# Patient Record
Sex: Female | Born: 1975 | Race: White | Hispanic: Yes | Marital: Married | Smoking: Never smoker
Health system: Southern US, Community
[De-identification: ages and names within clinical notes are randomized; demographics above are authoritative.]

## PROBLEM LIST (undated history)

## (undated) DIAGNOSIS — G43909 Migraine, unspecified, not intractable, without status migrainosus: Secondary | ICD-10-CM

## (undated) DIAGNOSIS — N939 Abnormal uterine and vaginal bleeding, unspecified: Secondary | ICD-10-CM

## (undated) DIAGNOSIS — M779 Enthesopathy, unspecified: Secondary | ICD-10-CM

## (undated) DIAGNOSIS — F909 Attention-deficit hyperactivity disorder, unspecified type: Secondary | ICD-10-CM

## (undated) DIAGNOSIS — K219 Gastro-esophageal reflux disease without esophagitis: Secondary | ICD-10-CM

## (undated) DIAGNOSIS — E282 Polycystic ovarian syndrome: Secondary | ICD-10-CM

## (undated) DIAGNOSIS — J302 Other seasonal allergic rhinitis: Secondary | ICD-10-CM

## (undated) DIAGNOSIS — Z8669 Personal history of other diseases of the nervous system and sense organs: Secondary | ICD-10-CM

## (undated) DIAGNOSIS — R002 Palpitations: Secondary | ICD-10-CM

## (undated) DIAGNOSIS — M797 Fibromyalgia: Secondary | ICD-10-CM

## (undated) DIAGNOSIS — F329 Major depressive disorder, single episode, unspecified: Secondary | ICD-10-CM

## (undated) DIAGNOSIS — R0600 Dyspnea, unspecified: Secondary | ICD-10-CM

## (undated) DIAGNOSIS — T8859XA Other complications of anesthesia, initial encounter: Secondary | ICD-10-CM

## (undated) DIAGNOSIS — N83209 Unspecified ovarian cyst, unspecified side: Secondary | ICD-10-CM

## (undated) DIAGNOSIS — D259 Leiomyoma of uterus, unspecified: Secondary | ICD-10-CM

## (undated) DIAGNOSIS — F32A Depression, unspecified: Secondary | ICD-10-CM

## (undated) DIAGNOSIS — I10 Essential (primary) hypertension: Secondary | ICD-10-CM

## (undated) DIAGNOSIS — M5030 Other cervical disc degeneration, unspecified cervical region: Secondary | ICD-10-CM

## (undated) DIAGNOSIS — F419 Anxiety disorder, unspecified: Secondary | ICD-10-CM

## (undated) DIAGNOSIS — G4730 Sleep apnea, unspecified: Secondary | ICD-10-CM

## (undated) DIAGNOSIS — R7303 Prediabetes: Secondary | ICD-10-CM

## (undated) DIAGNOSIS — J45909 Unspecified asthma, uncomplicated: Secondary | ICD-10-CM

## (undated) DIAGNOSIS — T4145XA Adverse effect of unspecified anesthetic, initial encounter: Secondary | ICD-10-CM

## (undated) HISTORY — DX: Essential (primary) hypertension: I10

## (undated) HISTORY — DX: Abnormal uterine and vaginal bleeding, unspecified: N93.9

## (undated) HISTORY — DX: Leiomyoma of uterus, unspecified: D25.9

## (undated) HISTORY — DX: Unspecified ovarian cyst, unspecified side: N83.209

## (undated) HISTORY — DX: Gastro-esophageal reflux disease without esophagitis: K21.9

## (undated) HISTORY — PX: BACK SURGERY: SHX140

## (undated) HISTORY — DX: Depression, unspecified: F32.A

## (undated) HISTORY — DX: Major depressive disorder, single episode, unspecified: F32.9

## (undated) HISTORY — PX: WISDOM TOOTH EXTRACTION: SHX21

---

## 2012-09-19 DIAGNOSIS — F32A Depression, unspecified: Secondary | ICD-10-CM | POA: Insufficient documentation

## 2012-09-19 DIAGNOSIS — F419 Anxiety disorder, unspecified: Secondary | ICD-10-CM | POA: Insufficient documentation

## 2012-09-19 DIAGNOSIS — F329 Major depressive disorder, single episode, unspecified: Secondary | ICD-10-CM | POA: Insufficient documentation

## 2012-09-19 DIAGNOSIS — Z6841 Body Mass Index (BMI) 40.0 and over, adult: Secondary | ICD-10-CM

## 2016-10-06 ENCOUNTER — Other Ambulatory Visit: Payer: Self-pay | Admitting: Orthopedic Surgery

## 2016-10-06 DIAGNOSIS — M5412 Radiculopathy, cervical region: Secondary | ICD-10-CM

## 2016-10-19 ENCOUNTER — Other Ambulatory Visit: Payer: Self-pay

## 2016-10-27 ENCOUNTER — Ambulatory Visit
Admission: RE | Admit: 2016-10-27 | Discharge: 2016-10-27 | Disposition: A | Discharge status: Home / Self Care | Payer: 59 | Source: Ambulatory Visit | Attending: Orthopedic Surgery | Admitting: Orthopedic Surgery

## 2016-10-27 DIAGNOSIS — M5412 Radiculopathy, cervical region: Secondary | ICD-10-CM

## 2017-05-23 ENCOUNTER — Ambulatory Visit (HOSPITAL_BASED_OUTPATIENT_CLINIC_OR_DEPARTMENT_OTHER)
Admit: 2017-05-23 | Discharge: 2017-05-23 | Disposition: A | Discharge status: Home / Self Care | Payer: 59 | Attending: Emergency Medicine | Admitting: Emergency Medicine

## 2017-05-23 ENCOUNTER — Encounter (HOSPITAL_BASED_OUTPATIENT_CLINIC_OR_DEPARTMENT_OTHER): Payer: Self-pay | Admitting: *Deleted

## 2017-05-23 ENCOUNTER — Emergency Department (HOSPITAL_BASED_OUTPATIENT_CLINIC_OR_DEPARTMENT_OTHER)
Admission: EM | Admit: 2017-05-23 | Discharge: 2017-05-23 | Disposition: A | Discharge status: Home / Self Care | Payer: 59 | Attending: Emergency Medicine | Admitting: Emergency Medicine

## 2017-05-23 DIAGNOSIS — R1013 Epigastric pain: Secondary | ICD-10-CM | POA: Diagnosis not present

## 2017-05-23 DIAGNOSIS — R1011 Right upper quadrant pain: Secondary | ICD-10-CM

## 2017-05-23 DIAGNOSIS — K8020 Calculus of gallbladder without cholecystitis without obstruction: Secondary | ICD-10-CM | POA: Insufficient documentation

## 2017-05-23 DIAGNOSIS — Z79899 Other long term (current) drug therapy: Secondary | ICD-10-CM | POA: Insufficient documentation

## 2017-05-23 HISTORY — DX: Polycystic ovarian syndrome: E28.2

## 2017-05-23 HISTORY — DX: Anxiety disorder, unspecified: F41.9

## 2017-05-23 LAB — URINALYSIS, ROUTINE W REFLEX MICROSCOPIC
Bilirubin Urine: NEGATIVE
Glucose, UA: NEGATIVE mg/dL
Hgb urine dipstick: NEGATIVE
Ketones, ur: NEGATIVE mg/dL
Leukocytes, UA: NEGATIVE
Nitrite: NEGATIVE
Protein, ur: NEGATIVE mg/dL
Specific Gravity, Urine: 1.014 (ref 1.005–1.030)
pH: 7.5 (ref 5.0–8.0)

## 2017-05-23 LAB — COMPREHENSIVE METABOLIC PANEL
ALT: 87 U/L — ABNORMAL HIGH (ref 14–54)
AST: 123 U/L — ABNORMAL HIGH (ref 15–41)
Albumin: 3.5 g/dL (ref 3.5–5.0)
Alkaline Phosphatase: 98 U/L (ref 38–126)
Anion gap: 7 (ref 5–15)
BUN: 9 mg/dL (ref 6–20)
CO2: 28 mmol/L (ref 22–32)
Calcium: 8.4 mg/dL — ABNORMAL LOW (ref 8.9–10.3)
Chloride: 103 mmol/L (ref 101–111)
Creatinine, Ser: 0.86 mg/dL (ref 0.44–1.00)
GFR calc Af Amer: >60 mL/min (ref >60)
GFR calc non Af Amer: >60 mL/min (ref >60)
Glucose, Bld: 139 mg/dL — ABNORMAL HIGH (ref 65–99)
Potassium: 3.7 mmol/L (ref 3.5–5.1)
Sodium: 138 mmol/L (ref 135–145)
Total Bilirubin: 0.9 mg/dL (ref 0.3–1.2)
Total Protein: 6.6 g/dL (ref 6.5–8.1)

## 2017-05-23 LAB — CBC WITH DIFFERENTIAL/PLATELET
Basophils Absolute: 0 10*3/uL (ref 0.0–0.1)
Basophils Relative: 0 %
Eosinophils Absolute: 0.4 10*3/uL (ref 0.0–0.7)
Eosinophils Relative: 4 %
HCT: 39.6 % (ref 36.0–46.0)
Hemoglobin: 13.7 g/dL (ref 12.0–15.0)
Lymphocytes Relative: 18 %
Lymphs Abs: 1.6 10*3/uL (ref 0.7–4.0)
MCH: 30.4 pg (ref 26.0–34.0)
MCHC: 34.6 g/dL (ref 30.0–36.0)
MCV: 87.8 fL (ref 78.0–100.0)
Monocytes Absolute: 1 10*3/uL (ref 0.1–1.0)
Monocytes Relative: 11 %
Neutro Abs: 6.1 10*3/uL (ref 1.7–7.7)
Neutrophils Relative %: 67 %
Platelets: 217 10*3/uL (ref 150–400)
RBC: 4.51 MIL/uL (ref 3.87–5.11)
RDW: 13.1 % (ref 11.5–15.5)
WBC: 9.1 10*3/uL (ref 4.0–10.5)

## 2017-05-23 LAB — URINALYSIS, MICROSCOPIC (REFLEX): Bacteria, UA: NONE SEEN

## 2017-05-23 LAB — PREGNANCY, URINE: Preg Test, Ur: NEGATIVE

## 2017-05-23 LAB — LIPASE, BLOOD: Lipase: 33 U/L (ref 11–51)

## 2017-05-23 MED ORDER — ONDANSETRON HCL 4 MG/2ML IJ SOLN
4.0000 mg | Freq: Once | INTRAMUSCULAR | Status: AC
  Administered 2017-05-23: 4 mg via INTRAVENOUS
  Filled 2017-05-23: qty 2

## 2017-05-23 MED ORDER — FENTANYL CITRATE (PF) 100 MCG/2ML IJ SOLN
50.0000 ug | Freq: Once | INTRAMUSCULAR | Status: AC
  Administered 2017-05-23: 50 ug via INTRAVENOUS
  Filled 2017-05-23: qty 2

## 2017-05-23 MED ORDER — SODIUM CHLORIDE 0.9 % IV BOLUS (SEPSIS)
1000.0000 mL | Freq: Once | INTRAVENOUS | Status: AC
  Administered 2017-05-23: 1000 mL via INTRAVENOUS

## 2017-05-23 NOTE — ED Provider Notes (Signed)
Patient presented to the radiology department today for RUQ [name] as an extension of their workup for abdominal pain earlier in them orning. Please see previous provider's note for details of that visit to include history, physical and medical decision making.   I only relayed the results of the study to them that there was no acute cholecystitis. I discussed decreasing the amount of fat her face. Also discussed following up with [facility] for her biliary colic.  Also discussed with them reasons to follow up at the emergency department otherwise continue following up with her primary doctor as directed by previous provider.   Merrily Pew, MD 05/23/17 [phone]

## 2017-05-23 NOTE — ED Provider Notes (Signed)
[place] DEPT [facility] Provider Note   CSN: [id number] Arrival date & time: 05/23/17  0110     History   Chief Complaint Chief Complaint  Patient presents with  . Abdominal Pain    HPI Maria Bright is a 41 y.o. female.  The history is provided by the patient.  Abdominal Pain   This is a new problem. The current episode started more than 1 week ago. The problem has been rapidly worsening. The pain is associated with eating. The pain is located in the epigastric region and RUQ. The pain is severe. Associated symptoms include diarrhea, nausea and vomiting. Pertinent negatives include fever, hematochezia and dysuria. The symptoms are aggravated by eating and palpation. Nothing relieves the symptoms.  Patient reports over past several weeks she will have episodes of epigastric abd pain It seems to be related to eating She reports associated nonbloody vomiting She also reports frequent diarrhea (nonbloody) No h/o previous abdominal surgeries   Past Medical History:  Diagnosis Date  . Anxiety   . Polycystic disease, ovaries    POC     There are no active problems to display for this patient.   History reviewed. No pertinent surgical history.  OB History    No data available       Home Medications    Prior to Admission medications   Medication Sig Start Date End Date Taking? Authorizing Provider  BuPROPion HCl (WELLBUTRIN PO) Take 300 mg by mouth.   Yes [provider]  desvenlafaxine (PRISTIQ) 100 MG 24 hr tablet Take 100 mg by mouth daily.   Yes [provider]  lisdexamfetamine (VYVANSE) 20 MG capsule Take 20 mg by mouth 2 (two) times daily.   Yes [provider]    Family History No family history on file.  Social History Social History  Substance Use Topics  . Smoking status: Not on file  . Smokeless tobacco: Not on file  . Alcohol use Not on file     Allergies   Patient has no known allergies.   Review of  Systems Review of Systems  Constitutional: Negative for fever.  Cardiovascular: Negative for chest pain.  Gastrointestinal: Positive for abdominal pain, diarrhea, nausea and vomiting. Negative for hematochezia.  Genitourinary: Negative for dysuria.  All other systems reviewed and are negative.    Physical Exam Updated Vital Signs BP (!) 146/100   Pulse 85   Temp 98.1 F (36.7 C) (Oral)   Resp 20   Ht 1.676 m (5\' 6" )   Wt 129.3 kg (285 lb)   LMP 05/23/2017 (Exact Date)   SpO2 100%   BMI 46.00 kg/m   Physical Exam CONSTITUTIONAL: Well developed/well nourished HEAD: Normocephalic/atraumatic EYES: EOMI/PERRL, no icterus ENMT: Mucous membranes moist NECK: supple no meningeal signs SPINE/BACK:entire spine nontender CV: S1/S2 noted, no murmurs/rubs/gallops noted LUNGS: Lungs are clear to auscultation bilaterally, no apparent distress ABDOMEN: soft, moderate epigastric and RUQ tenderness, no rebound or guarding, bowel sounds noted throughout abdomen, obese GU:no cva tenderness NEURO: Pt is awake/alert/appropriate, moves all extremitiesx4.  No facial droop.   EXTREMITIES: pulses normal/equal, full ROM SKIN: warm, color normal PSYCH: no abnormalities of mood noted, alert and oriented to situation   ED Treatments / Results  Labs (all labs ordered are listed, but only abnormal results are displayed) Labs Reviewed  URINALYSIS, ROUTINE W REFLEX MICROSCOPIC - Abnormal; Notable for the following:       Result Value   APPearance TURBID (*)    All other components within  normal limits  URINALYSIS, MICROSCOPIC (REFLEX) - Abnormal; Notable for the following:    Squamous Epithelial / LPF 0-5 (*)    All other components within normal limits  COMPREHENSIVE METABOLIC PANEL - Abnormal; Notable for the following:    Glucose, Bld 139 (*)    Calcium 8.4 (*)    AST 123 (*)    ALT 87 (*)    All other components within normal limits  PREGNANCY, URINE  CBC WITH DIFFERENTIAL/PLATELET   LIPASE, BLOOD    EKG  EKG Interpretation None       Radiology No results found.  Procedures Procedures (including critical care time)  Medications Ordered in ED Medications  fentaNYL (SUBLIMAZE) injection 50 mcg (50 mcg Intravenous Given 05/23/17 0258)  ondansetron (ZOFRAN) injection 4 mg (4 mg Intravenous Given 05/23/17 0258)  sodium chloride 0.9 % bolus 1,000 mL (0 mLs Intravenous Stopped 05/23/17 0348)     Initial Impression / Assessment and Plan / ED Course  I have reviewed the triage vital signs and the nursing notes.  Pertinent labs  results that were available during my care of the patient were reviewed by me and considered in my medical decision making (see chart for details).     Pt improved ABD pain resolved No focal tenderness She is taking PO fluids I Suspect biliary colic However, low suspicion for acute cholecystitis Will have her return later today for outpatient ultrasound  We discussed strict ER return precautions  Final Clinical Impressions(s) / ED Diagnoses   Final diagnoses:  Epigastric pain    New Prescriptions New Prescriptions   No medications on file     Ripley Fraise, MD 05/23/17 [phone]

## 2017-05-23 NOTE — ED Triage Notes (Signed)
C/o upper mid abd pain that started yesterday around 730pm. States she ate chinese around 130pm. C/o nausea and vomiting. Denies any diarrhea. Denies any fevers but c/o of chills. Denies any urinary frequency. Currently on menstrual cycle. Took OTC nausea med.

## 2017-06-04 ENCOUNTER — Emergency Department (HOSPITAL_COMMUNITY)
Admission: EM | Admit: 2017-06-04 | Discharge: 2017-06-05 | Disposition: A | Discharge status: Home / Self Care | Payer: 59 | Attending: Emergency Medicine | Admitting: Emergency Medicine

## 2017-06-04 ENCOUNTER — Encounter (HOSPITAL_COMMUNITY): Payer: Self-pay | Admitting: Nurse Practitioner

## 2017-06-04 DIAGNOSIS — R1011 Right upper quadrant pain: Secondary | ICD-10-CM | POA: Insufficient documentation

## 2017-06-04 DIAGNOSIS — K8020 Calculus of gallbladder without cholecystitis without obstruction: Secondary | ICD-10-CM | POA: Diagnosis not present

## 2017-06-04 DIAGNOSIS — R109 Unspecified abdominal pain: Secondary | ICD-10-CM

## 2017-06-04 DIAGNOSIS — Z79899 Other long term (current) drug therapy: Secondary | ICD-10-CM | POA: Diagnosis not present

## 2017-06-04 LAB — COMPREHENSIVE METABOLIC PANEL
ALT: 46 U/L (ref 14–54)
AST: 26 U/L (ref 15–41)
Albumin: 4.2 g/dL (ref 3.5–5.0)
Alkaline Phosphatase: 106 U/L (ref 38–126)
Anion gap: 7 (ref 5–15)
BUN: 11 mg/dL (ref 6–20)
CO2: 27 mmol/L (ref 22–32)
Calcium: 9.5 mg/dL (ref 8.9–10.3)
Chloride: 107 mmol/L (ref 101–111)
Creatinine, Ser: 0.94 mg/dL (ref 0.44–1.00)
GFR calc Af Amer: >60 mL/min (ref >60)
GFR calc non Af Amer: >60 mL/min (ref >60)
Glucose, Bld: 107 mg/dL — ABNORMAL HIGH (ref 65–99)
Potassium: 3.2 mmol/L — ABNORMAL LOW (ref 3.5–5.1)
Sodium: 141 mmol/L (ref 135–145)
Total Bilirubin: 0.2 mg/dL — ABNORMAL LOW (ref 0.3–1.2)
Total Protein: 7.7 g/dL (ref 6.5–8.1)

## 2017-06-04 LAB — CBC WITH DIFFERENTIAL/PLATELET
Basophils Absolute: 0 10*3/uL (ref 0.0–0.1)
Basophils Relative: 0 %
Eosinophils Absolute: 0.4 10*3/uL (ref 0.0–0.7)
Eosinophils Relative: 4 %
HCT: 41.1 % (ref 36.0–46.0)
Hemoglobin: 14.2 g/dL (ref 12.0–15.0)
Lymphocytes Relative: 27 %
Lymphs Abs: 2.7 10*3/uL (ref 0.7–4.0)
MCH: 29.5 pg (ref 26.0–34.0)
MCHC: 34.5 g/dL (ref 30.0–36.0)
MCV: 85.4 fL (ref 78.0–100.0)
Monocytes Absolute: 0.9 10*3/uL (ref 0.1–1.0)
Monocytes Relative: 9 %
Neutro Abs: 5.9 10*3/uL (ref 1.7–7.7)
Neutrophils Relative %: 60 %
Platelets: 260 10*3/uL (ref 150–400)
RBC: 4.81 MIL/uL (ref 3.87–5.11)
RDW: 13.1 % (ref 11.5–15.5)
WBC: 9.9 10*3/uL (ref 4.0–10.5)

## 2017-06-04 LAB — LIPASE, BLOOD: Lipase: 29 U/L (ref 11–51)

## 2017-06-04 LAB — I-STAT BETA HCG BLOOD, ED (MC, WL, AP ONLY): I-stat hCG, quantitative: <5 m[IU]/mL (ref <5)

## 2017-06-04 MED ORDER — HYDROMORPHONE HCL 1 MG/ML IJ SOLN
1.0000 mg | INTRAMUSCULAR | Status: DC | PRN
  Administered 2017-06-04 – 2017-06-05 (×2): 1 mg via INTRAVENOUS
  Filled 2017-06-04 (×2): qty 1

## 2017-06-04 MED ORDER — ONDANSETRON HCL 4 MG/2ML IJ SOLN
4.0000 mg | Freq: Once | INTRAMUSCULAR | Status: AC
  Administered 2017-06-04: 4 mg via INTRAVENOUS
  Filled 2017-06-04: qty 2

## 2017-06-04 MED ORDER — SODIUM CHLORIDE 0.9 % IV SOLN
INTRAVENOUS | Status: DC
  Administered 2017-06-04: 23:00:00 via INTRAVENOUS

## 2017-06-04 NOTE — ED Triage Notes (Signed)
Pt presents with abdominal pain induced distress, states she recently has been experiencing gall bladder issues and was awaiting to see the GI specialist for follow up. She states she is more than sure the symptoms she is experiencing are typical of her last gall bladder attack.

## 2017-06-04 NOTE — ED Provider Notes (Signed)
[place] DEPT Provider Note   CSN: [id number] Arrival date & time: 06/04/17  2158   By signing my name below, I, Soijett Blue, attest that this documentation has been prepared under the direction and in the presence of Varney Biles, MD. Electronically Signed: Soijett Blue, ED Scribe. 06/04/17. 11:16 PM.  History   Chief Complaint Chief Complaint  Patient presents with  . Abdominal Pain    Gall Bladder Issues    HPI Maria Bright is a 41 y.o. female with a PMHx of anxiety, who presents to the Emergency Department complaining of gradually worsening RUQ and epigastric abdominal pain onset 4 hours ago. Pt reports associated intermittent nausea, diaphoresis due to pain, and chills. Pt has not tried any medications for the relief of her symptoms. She notes that she has been evaluated in the ED with similar abdominal pain recently and had a RUQ [name] that showed gallstones. Pt reports that she has a consultation with a GI specialist surgeon on 06/11/2017 for evaluation of her symptoms. She denies vomiting, fever, and any other symptoms.    The history is provided by the patient and a significant other. No language interpreter was used.    Past Medical History:  Diagnosis Date  . Anxiety   . Polycystic disease, ovaries    POC     There are no active problems to display for this patient.   History reviewed. No pertinent surgical history.  OB History    No data available       Home Medications    Prior to Admission medications   Medication Sig Start Date End Date Taking? Authorizing Provider  BuPROPion HCl (WELLBUTRIN PO) Take 300 mg by mouth daily.    Yes [provider]  cetirizine (ZYRTEC) 10 MG tablet Take 10 mg by mouth daily as needed for allergies.    Yes [provider]  desvenlafaxine (PRISTIQ) 100 MG 24 hr tablet Take 100 mg by mouth daily.   Yes [provider]  diphenhydrAMINE (BENADRYL) 25 MG tablet Take 50 mg by mouth every 6  (six) hours as needed for itching or allergies.   Yes [provider]  fexofenadine (ALLEGRA) 180 MG tablet Take 180 mg by mouth daily as needed for allergies or rhinitis.   Yes [provider]  ibuprofen (ADVIL,MOTRIN) 200 MG tablet Take 800 mg by mouth every 6 (six) hours as needed for moderate pain.   Yes [provider]  lisdexamfetamine (VYVANSE) 20 MG capsule Take 20 mg by mouth 2 (two) times daily.   Yes [provider]  norethindrone-ethinyl estradiol (JUNEL FE,GILDESS FE,LOESTRIN FE) 1-20 MG-MCG tablet TAKE 1 TABLET BY MOUTH EVERY DAY 04/14/16  Yes [provider]  propranolol (INDERAL) 10 MG tablet Take 10 mg by mouth daily as needed. Anxiety 06/03/17  Yes [provider]  acetaminophen (TYLENOL 8 HOUR) 650 MG CR tablet Take 1 tablet (650 mg total) by mouth every 8 (eight) hours. 06/05/17   Varney Biles, MD  HYDROcodone-acetaminophen (NORCO/VICODIN) 5-325 MG tablet Take 1 tablet by mouth every 6 (six) hours as needed for severe pain. 06/05/17   Varney Biles, MD  omeprazole (PRILOSEC) 20 MG capsule Take 1 capsule (20 mg total) by mouth daily. 06/05/17   Varney Biles, MD    Family History History reviewed. No pertinent family history.  Social History Social History  Substance Use Topics  . Smoking status: Not on file  . Smokeless tobacco: Not on file  . Alcohol use No  Allergies   Morphine and related and Nickel   Review of Systems Review of Systems  Constitutional: Positive for chills and diaphoresis.  Gastrointestinal: Positive for abdominal pain (RUQ and epigastric) and nausea. Negative for vomiting.  All other systems reviewed and are negative.    Physical Exam Updated Vital Signs BP (!) 174/96 (BP Location: Right Arm)   Pulse 83   Temp 98.1 F (36.7 C) (Oral)   Resp 18   LMP 05/23/2017 (Exact Date)   SpO2 100%   Physical Exam  Constitutional: She is oriented to person, place, and time. She appears  well-developed and well-nourished. No distress.  HENT:  Head: Normocephalic and atraumatic.  Eyes: EOM are normal.  Neck: Neck supple.  Cardiovascular: Normal rate, regular rhythm and normal heart sounds.  Exam reveals no gallop and no friction rub.   No murmur heard. Pulmonary/Chest: Effort normal and breath sounds normal. No respiratory distress. She has no wheezes. She has no rales.  Abdominal: Soft. She exhibits no distension. There is tenderness in the right upper quadrant and epigastric area. There is guarding.  Epigastric and RUQ tenderness with guarding.   Musculoskeletal: Normal range of motion.  Neurological: She is alert and oriented to person, place, and time.  Skin: Skin is warm and dry.  Psychiatric: She has a normal mood and affect. Her behavior is normal.  Nursing note and vitals reviewed.    ED Treatments / Results  DIAGNOSTIC STUDIES: Oxygen Saturation is 100% on RA, nl by my interpretation.    COORDINATION OF CARE: 11:11 PM Discussed treatment plan with pt at bedside which includes labs, UA and pt agreed to plan.   Labs (all labs ordered are listed, but only abnormal results are displayed) Labs Reviewed  COMPREHENSIVE METABOLIC PANEL - Abnormal; Notable for the following:       Result Value   Potassium 3.2 (*)    Glucose, Bld 107 (*)    Total Bilirubin 0.2 (*)    All other components within normal limits  CBC WITH DIFFERENTIAL/PLATELET  LIPASE, BLOOD  I-STAT BETA HCG BLOOD, ED ([facility], [facility], [facility] ONLY)    EKG  EKG Interpretation None       Radiology US Abdomen Limited Ruq  Result Date: 06/05/2017 CLINICAL DATA:  Persistent right upper quadrant pain. EXAM: ULTRASOUND ABDOMEN LIMITED RIGHT UPPER QUADRANT COMPARISON:  Ultrasound 2 days prior 05/23/2017 FINDINGS: Gallbladder: Physiologically distended containing sludge and multiple gallstones. Decreased wall thickening from prior exam, currently 2.8 mm, previously 3.9 mm. No pericholecystic fluid. No  sonographic Murphy sign noted by sonographer. Common bile duct: Diameter: 4-5 mm. Liver: No focal lesion identified. Within normal limits in parenchymal echogenicity. IMPRESSION: Cholelithiasis. Decreased gallbladder wall thickening from prior exam. No sonographic findings of acute cholecystitis. Electronically Signed   By: Jeb Levering M.D.   On: 06/05/2017 01:08    Procedures Procedures (including critical care time)  Medications Ordered in ED Medications  0.9 %  sodium chloride infusion ( Intravenous Stopped 06/05/17 0130)  HYDROmorphone (DILAUDID) injection 1 mg (1 mg Intravenous Given 06/05/17 0332)  ondansetron (ZOFRAN) injection 4 mg (4 mg Intravenous Given 06/04/17 2318)  promethazine (PHENERGAN) injection 25 mg (25 mg Intravenous Given 06/05/17 0045)  HYDROcodone-acetaminophen (NORCO/VICODIN) 5-325 MG per tablet 1 tablet (1 tablet Oral Given 06/05/17 0627)  ondansetron (ZOFRAN) tablet 4 mg (4 mg Oral Given 06/05/17 6237)     Initial Impression / Assessment and Plan / ED Course  I have reviewed the triage vital signs and the nursing notes.  Pertinent labs & imaging results that were available during my care of the patient were reviewed by me and considered in my medical decision making (see chart for details).  Clinical Course as of Jun 05 720  Sat Jun 05, 2017  0093 Pain has now resolved. Strict ER return precautions have been discussed, and patient is agreeing with the plan and is comfortable with the workup done and the recommendations from the ER.  Pt started on omeprazole and asked to see GI after surgery consultation - there might be a component of PUD.  [AN]    Clinical Course User Index [AN] Varney Biles, MD    DDx includes: Pancreatitis Hepatobiliary pathology including cholecystitis Gastritis/PUD  Pt with hx of gall stones comes in with cc of abd pain. Pt's pain is epigastric, severe, and she is noted to be diaphoretic. We will repeat US Abd. She has guarding over  the epigastric region. ddx is as above. I spoke with Surgery, and Dr. Ninfa Linden informed me that if the [name] is neg for acute cholecystitis, and the labs are WNL they likely wouldn't plan imminent surgery.    Final Clinical Impressions(s) / ED Diagnoses   Final diagnoses:  Continuous severe abdominal pain  Symptomatic cholelithiasis    New Prescriptions Discharge Medication List as of 06/05/2017  6:22 AM    START taking these medications   Details  acetaminophen (TYLENOL 8 HOUR) 650 MG CR tablet Take 1 tablet (650 mg total) by mouth every 8 (eight) hours., Starting Sat 06/05/2017, Print    HYDROcodone-acetaminophen (NORCO/VICODIN) 5-325 MG tablet Take 1 tablet by mouth every 6 (six) hours as needed for severe pain., Starting Sat 06/05/2017, Print    omeprazole (PRILOSEC) 20 MG capsule Take 1 capsule (20 mg total) by mouth daily., Starting Sat 06/05/2017, Print       I personally performed the services described in this documentation, which was scribed in my presence. The recorded information has been reviewed and is accurate.     Varney Biles, MD 06/05/17 [phone]

## 2017-06-05 ENCOUNTER — Emergency Department (HOSPITAL_COMMUNITY): Payer: 59

## 2017-06-05 MED ORDER — HYDROCODONE-ACETAMINOPHEN 5-325 MG PO TABS
1.0000 | ORAL_TABLET | Freq: Once | ORAL | Status: AC
  Administered 2017-06-05: 1 via ORAL
  Filled 2017-06-05: qty 1

## 2017-06-05 MED ORDER — ONDANSETRON HCL 4 MG PO TABS
4.0000 mg | ORAL_TABLET | Freq: Once | ORAL | Status: AC
  Administered 2017-06-05: 4 mg via ORAL
  Filled 2017-06-05: qty 1

## 2017-06-05 MED ORDER — OMEPRAZOLE 20 MG PO CPDR: 20.0000 mg | DELAYED_RELEASE_CAPSULE | Freq: Every day | ORAL | 0 refills | Status: DC

## 2017-06-05 MED ORDER — HYDROCODONE-ACETAMINOPHEN 5-325 MG PO TABS: 1.0000 | ORAL_TABLET | Freq: Four times a day (QID) | ORAL | 0 refills | Status: DC | PRN

## 2017-06-05 MED ORDER — ACETAMINOPHEN ER 650 MG PO TBCR: 650.0000 mg | EXTENDED_RELEASE_TABLET | Freq: Three times a day (TID) | ORAL | 0 refills | Status: DC

## 2017-06-05 MED ORDER — PROMETHAZINE HCL 25 MG/ML IJ SOLN
25.0000 mg | Freq: Once | INTRAMUSCULAR | Status: AC
  Administered 2017-06-05: 25 mg via INTRAVENOUS
  Filled 2017-06-05: qty 1

## 2017-06-05 NOTE — ED Notes (Signed)
PT DISCHARGED. INSTRUCTIONS AND PRESCRIPTIONS GIVEN. AAOX4. PT IN NO APPARENT DISTRESS WITH MILD PAIN. THE OPPORTUNITY TO ASK QUESTIONS WAS PROVIDED. 

## 2017-06-05 NOTE — Discharge Instructions (Signed)
Please take the meds prescribed. Call the Surgery team to see if they can see you sooner.  Call the GI doctors for further evaluation as well.

## 2017-06-05 NOTE — ED Notes (Signed)
US at bedside

## 2017-06-07 ENCOUNTER — Ambulatory Visit (INDEPENDENT_AMBULATORY_CARE_PROVIDER_SITE_OTHER): Payer: 59 | Admitting: Internal Medicine

## 2017-06-07 ENCOUNTER — Encounter: Payer: Self-pay | Admitting: Internal Medicine

## 2017-06-07 VITALS — BP 130/80 | HR 92 | Temp 98.0°F | Ht 66.0 in | Wt 283.5 lb

## 2017-06-07 DIAGNOSIS — Z91018 Allergy to other foods: Secondary | ICD-10-CM | POA: Diagnosis not present

## 2017-06-07 DIAGNOSIS — F901 Attention-deficit hyperactivity disorder, predominantly hyperactive type: Secondary | ICD-10-CM

## 2017-06-07 DIAGNOSIS — F32A Depression, unspecified: Secondary | ICD-10-CM

## 2017-06-07 DIAGNOSIS — K219 Gastro-esophageal reflux disease without esophagitis: Secondary | ICD-10-CM

## 2017-06-07 DIAGNOSIS — K8020 Calculus of gallbladder without cholecystitis without obstruction: Secondary | ICD-10-CM | POA: Insufficient documentation

## 2017-06-07 DIAGNOSIS — F419 Anxiety disorder, unspecified: Secondary | ICD-10-CM | POA: Insufficient documentation

## 2017-06-07 DIAGNOSIS — M5412 Radiculopathy, cervical region: Secondary | ICD-10-CM | POA: Diagnosis not present

## 2017-06-07 DIAGNOSIS — I10 Essential (primary) hypertension: Secondary | ICD-10-CM | POA: Insufficient documentation

## 2017-06-07 DIAGNOSIS — F329 Major depressive disorder, single episode, unspecified: Secondary | ICD-10-CM

## 2017-06-07 DIAGNOSIS — F909 Attention-deficit hyperactivity disorder, unspecified type: Secondary | ICD-10-CM | POA: Insufficient documentation

## 2017-06-07 DIAGNOSIS — J301 Allergic rhinitis due to pollen: Secondary | ICD-10-CM

## 2017-06-07 DIAGNOSIS — E282 Polycystic ovarian syndrome: Secondary | ICD-10-CM

## 2017-06-07 DIAGNOSIS — J302 Other seasonal allergic rhinitis: Secondary | ICD-10-CM | POA: Insufficient documentation

## 2017-06-07 NOTE — Assessment & Plan Note (Signed)
Advised her to continue Prilosec Referral placed to GI to work up food allergies

## 2017-06-07 NOTE — Assessment & Plan Note (Signed)
Continue Zyrtec and Bendaryl

## 2017-06-07 NOTE — Assessment & Plan Note (Signed)
She is not ready for a referral to a neurosurgeon in this area at this time Will monitor

## 2017-06-07 NOTE — Assessment & Plan Note (Signed)
Follow-up with general surgery.

## 2017-06-07 NOTE — Progress Notes (Signed)
HPI  Pt presents to the clinic today to establish care and for management of the conditions listed below. She is transferring care from [facility].  Anxiety and Depression: Triggered by stressful life events. She takes Wellbutrin and Pristiq as prescribed and Propanolol as needed for anxiety. She follows at [facility].  ADHD: She is currently taking Vyvanse. She forgets to take it most of the time. She follows with [facility].  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 She has numbness and tingling in both of her arms. She follows with neurosurgeon but can not remember the name.   GERD: She is not sure what is triggering this. She recently restarted Prilosec, and has made some diet modifications as well. She reports she has a lot of food intolerances and food allergies and is requesting referral to GI for further evaluation.  HTN: Her BP today is 130/80. She does not take any antihypertensives currently but was on HCTZ in the past.  PCOS: She is currently taking the pill. She no longer takes Metformin. She does follow with GYN.  Gallstones: She reports that she has a surgical consult on Friday with [facility].  Seasonal Allergies: Worse in the fall. She takes Zyrtec daily and Benadryl as needed.  Flu: 08/2016 Tetanus: < 10 years ago Pap Smear: 03/2016 Dentist: biannually  Past Medical History:  Diagnosis Date  . Anxiety   . Depression   . GERD (gastroesophageal reflux disease)   . Hypertension   . Polycystic disease, ovaries    POC     Current Outpatient Prescriptions  Medication Sig Dispense Refill  . acetaminophen (TYLENOL 8 HOUR) 650 MG CR tablet Take 1 tablet (650 mg total) by mouth every 8 (eight) hours. 30 tablet 0  . BuPROPion HCl (WELLBUTRIN PO) Take 300 mg by mouth daily.     . cetirizine (ZYRTEC) 10 MG tablet Take 10 mg by mouth daily as needed for  allergies.     Marland Kitchen desvenlafaxine (PRISTIQ) 100 MG 24 hr tablet Take 100 mg by mouth daily.    . diphenhydrAMINE (BENADRYL) 25 MG tablet Take 50 mg by mouth every 6 (six) hours as needed for itching or allergies.    . fexofenadine (ALLEGRA) 180 MG tablet Take 180 mg by mouth daily as needed for allergies or rhinitis.    Marland Kitchen HYDROcodone-acetaminophen (NORCO/VICODIN) 5-325 MG tablet Take 1 tablet by mouth every 6 (six) hours as needed for severe pain. 12 tablet 0  . ibuprofen (ADVIL,MOTRIN) 200 MG tablet Take 800 mg by mouth every 6 (six) hours as needed for moderate pain.    Marland Kitchen lisdexamfetamine (VYVANSE) 20 MG capsule Take 20 mg by mouth 2 (two) times daily.    . norethindrone-ethinyl estradiol (JUNEL FE,GILDESS FE,LOESTRIN FE) 1-20 MG-MCG tablet TAKE 1 TABLET BY MOUTH EVERY DAY    . omeprazole (PRILOSEC) 20 MG capsule Take 1 capsule (20 mg total) by mouth daily. 60 capsule 0  . propranolol (INDERAL) 10 MG tablet Take 10 mg by mouth daily as needed. Anxiety     No current facility-administered medications for this visit.     Allergies  Allergen Reactions  . Morphine And Related Other (See Comments)    unknown  . Nickel     Family History  Problem Relation Age of Onset  . Arthritis Mother   . Hyperlipidemia Mother   . Heart disease Mother   . Hypertension Mother   .  Rheum arthritis Maternal Aunt   . Hyperlipidemia Paternal Aunt   . Diabetes Paternal Aunt   . Rheum arthritis Maternal Grandmother   . Heart disease Maternal Grandmother   . Arthritis Maternal Grandfather   . Colon cancer Maternal Grandfather   . Hyperlipidemia Maternal Grandfather   . Heart disease Maternal Grandfather   . Hypertension Maternal Grandfather     Social History   Social History  . Marital status: Married    Spouse name: N/A  . Number of children: N/A  . Years of education: N/A   Occupational History  . Not on file.   Social History Main Topics  . Smoking status: Never Smoker  . Smokeless  tobacco: Never Used  . Alcohol use No  . Drug use: Unknown  . Sexual activity: Not on file   Other Topics Concern  . Not on file   Social History Narrative  . No narrative on file    ROS:  Constitutional: Pt reports weight gain. Denies fever, malaise, fatigue, headache.  HEENT: Denies eye pain, eye redness, ear pain, ringing in the ears, wax buildup, runny nose, nasal congestion, bloody nose, or sore throat. Respiratory: Denies difficulty breathing, shortness of breath, cough or sputum production.   Cardiovascular: Denies chest pain, chest tightness, palpitations or swelling in the hands or feet.  Gastrointestinal: Pt reports abdominal pain. Denies bloating, constipation, diarrhea or blood in the stool.  GU: Denies frequency, urgency, pain with urination, blood in urine, odor or discharge. Musculoskeletal: Denies decrease in range of motion, difficulty with gait, muscle pain or joint pain and swelling.  Skin: Denies redness, rashes, lesions or ulcercations.  Neurological: Pt reports numbness and tingling in hands. Denies dizziness, difficulty with memory, difficulty with speech or problems with balance and coordination.  Psych: Pt reports anxiety and depression. Denies SI/HI.  No other specific complaints in a complete review of systems (except as listed in HPI above).  PE:  BP 130/80   Pulse 92   Temp 98 F (36.7 C) (Oral)   Ht 5\' 6"  (1.676 m)   Wt 283 lb 8 oz (128.6 kg)   LMP 05/23/2017 (Exact Date)   SpO2 98%   BMI 45.76 kg/m  Wt Readings from Last 3 Encounters:  06/07/17 283 lb 8 oz (128.6 kg)  06/05/17 290 lb (131.5 kg)  05/23/17 285 lb (129.3 kg)    General: Appears her stated age, obese in NAD. HEENT: Ears: Tm's gray and intact, normal light reflex;Throat/Mouth: Teeth present, mucosa pink and moist, no lesions or ulcerations noted.  Neck: Neck supple, trachea midline. No masses, lumps or thyromegaly present.  Cardiovascular: Normal rate and rhythm. S1,S2 noted.   No murmur, rubs or gallops noted. Pulmonary/Chest: Normal effort and positive vesicular breath sounds. No respiratory distress. No wheezes, rales or ronchi noted.  Abdomen: Soft and tender in the RUQ. Normal bowel sounds. No distention or masses noted. Musculoskeletal:  No difficulty with gait.  Neurological: Alert and oriented. Marland Kitchen  Psychiatric: Mood and affect normal. Behavior is normal. Judgment and thought content normal.    BMET    Component Value Date/Time   NA 141 06/04/2017 2253   K 3.2 (L) 06/04/2017 2253   CL 107 06/04/2017 2253   CO2 27 06/04/2017 2253   GLUCOSE 107 (H) 06/04/2017 2253   BUN 11 06/04/2017 2253   CREATININE 0.94 06/04/2017 2253   CALCIUM 9.5 06/04/2017 2253   GFRNONAA >60 06/04/2017 2253   GFRAA >60 06/04/2017 2253    Lipid Panel  No results found for: CHOL, TRIG, HDL, CHOLHDL, VLDL, LDLCALC  CBC    Component Value Date/Time   WBC 9.9 06/04/2017 2253   RBC 4.81 06/04/2017 2253   HGB 14.2 06/04/2017 2253   HCT 41.1 06/04/2017 2253   PLT 260 06/04/2017 2253   MCV 85.4 06/04/2017 2253   MCH 29.5 06/04/2017 2253   MCHC 34.5 06/04/2017 2253   RDW 13.1 06/04/2017 2253   LYMPHSABS 2.7 06/04/2017 2253   MONOABS 0.9 06/04/2017 2253   EOSABS 0.4 06/04/2017 2253   BASOSABS 0.0 06/04/2017 2253    Hgb A1C No results found for: HGBA1C   Assessment and Plan:

## 2017-06-07 NOTE — Assessment & Plan Note (Signed)
Will monitor BP for now No meds at this  time

## 2017-06-07 NOTE — Patient Instructions (Signed)
Food Choices for Gastroesophageal Reflux Disease, Adult When you have gastroesophageal reflux disease (GERD), the foods you eat and your eating habits are very important. Choosing the right foods can help ease your discomfort. What guidelines do I need to follow?  Choose fruits, vegetables, whole grains, and low-fat dairy products.  Choose low-fat meat, fish, and poultry.  Limit fats such as oils, salad dressings, butter, nuts, and avocado.  Keep a food diary. This helps you identify foods that cause symptoms.  Avoid foods that cause symptoms. These may be different for everyone.  Eat small meals often instead of 3 large meals a day.  Eat your meals slowly, in a place where you are relaxed.  Limit fried foods.  Cook foods using methods other than frying.  Avoid drinking alcohol.  Avoid drinking large amounts of liquids with your meals.  Avoid bending over or lying down until 2-3 hours after eating. What foods are not recommended? These are some foods and drinks that may make your symptoms worse: Vegetables  Tomatoes. Tomato juice. Tomato and spaghetti sauce. Chili peppers. Onion and garlic. Horseradish. Fruits  Oranges, grapefruit, and lemon (fruit and juice). Meats  High-fat meats, fish, and poultry. This includes hot dogs, ribs, ham, sausage, salami, and bacon. Dairy  Whole milk and chocolate milk. Sour cream. Cream. Butter. Ice cream. Cream cheese. Drinks  Coffee and tea. Bubbly (carbonated) drinks or energy drinks. Condiments  Hot sauce. Barbecue sauce. Sweets/Desserts  Chocolate and cocoa. Donuts. Peppermint and spearmint. Fats and Oils  High-fat foods. This includes French fries and potato chips. Other  Vinegar. Strong spices. This includes black pepper, white pepper, red pepper, cayenne, curry powder, cloves, ginger, and chili powder. The items listed above may not be a complete list of foods and drinks to avoid. Contact your dietitian for more information.    This information is not intended to replace advice given to you by your health care provider. Make sure you discuss any questions you have with your health care provider. Document Released: 05/17/2012 Document Revised: 04/23/2016 Document Reviewed: 09/20/2013 Elsevier Interactive Patient Education  2017 Elsevier Inc.  

## 2017-06-07 NOTE — Assessment & Plan Note (Signed)
She will continue Vyvanse She will continue to follow with [facility]

## 2017-06-07 NOTE — Assessment & Plan Note (Signed)
Continue Wellbutrin, Pristiq and Propanolol She will continue to follow with [facility]

## 2017-06-07 NOTE — Assessment & Plan Note (Signed)
Continue OCP She will continue to follow up with GYN

## 2017-06-11 ENCOUNTER — Ambulatory Visit: Payer: Self-pay | Admitting: General Surgery

## 2017-06-18 NOTE — Patient Instructions (Addendum)
Maria Bright  06/18/2017   Your procedure is scheduled on: 06/30/2017    Report to [facility] Main  Entrance Take [place]  elevators to 3rd floor to  [place] at    [place] AM.   Call this number if you have problems the morning of surgery [phone]    Remember: ONLY 1 PERSON MAY GO WITH YOU TO SHORT STAY TO GET  READY MORNING OF YOUR SURGERY.  Do not eat food or drink liquids :After Midnight.     Take these medicines the morning of surgery with A SIP OF WATER: Wellbutrin, ranitidine, propanolol as needed                                You may not have any metal on your body including hair pins and              piercings  Do not wear jewelry, make-up, lotions, powders or perfumes, deodorant             Do not wear nail polish.  Do not shave  48 hours prior to surgery.                 Do not bring valuables to the hospital. [place].  Contacts, dentures or bridgework may not be worn into surgery.      Patients discharged the day of surgery will not be allowed to drive home.  Name and phone number of your driver:                Please read over the following fact sheets you were given: _____________________________________________________________________             [facility] - Preparing for Surgery Before surgery, you can play an important role.  Because skin is not sterile, your skin needs to be as free of germs as possible.  You can reduce the number of germs on your skin by washing with CHG (chlorahexidine gluconate) soap before surgery.  CHG is an antiseptic cleaner which kills germs and bonds with the skin to continue killing germs even after washing. Please DO NOT use if you have an allergy to CHG or antibacterial soaps.  If your skin becomes reddened/irritated stop using the CHG and inform your nurse when you arrive at Short Stay. Do not shave (including legs and underarms) for at least  48 hours prior to the first CHG shower.  You may shave your face/neck. Please follow these instructions carefully:  1.  Shower with CHG Soap the night before surgery and the  morning of Surgery.  2.  If you choose to wash your hair, wash your hair first as usual with your  normal  shampoo.  3.  After you shampoo, rinse your hair and body thoroughly to remove the  shampoo.                           4.  Use CHG as you would any other liquid soap.  You can apply chg directly  to the skin and wash  Gently with a scrungie or clean washcloth.  5.  Apply the CHG Soap to your body ONLY FROM THE NECK DOWN.   Do not use on face/ open                           Wound or open sores. Avoid contact with eyes, ears mouth and genitals (private parts).                       Wash face,  Genitals (private parts) with your normal soap.             6.  Wash thoroughly, paying special attention to the area where your surgery  will be performed.  7.  Thoroughly rinse your body with warm water from the neck down.  8.  DO NOT shower/wash with your normal soap after using and rinsing off  the CHG Soap.                9.  Pat yourself dry with a clean towel.            10.  Wear clean pajamas.            11.  Place clean sheets on your bed the night of your first shower and do not  sleep with pets. Day of Surgery : Do not apply any lotions/deodorants the morning of surgery.  Please wear clean clothes to the hospital/surgery center.  FAILURE TO FOLLOW THESE INSTRUCTIONS MAY RESULT IN THE CANCELLATION OF YOUR SURGERY PATIENT SIGNATURE_________________________________  NURSE SIGNATURE__________________________________  ________________________________________________________________________

## 2017-06-21 ENCOUNTER — Other Ambulatory Visit: Payer: Self-pay

## 2017-06-21 ENCOUNTER — Encounter (HOSPITAL_COMMUNITY)
Admission: RE | Admit: 2017-06-21 | Discharge: 2017-06-21 | Disposition: A | Discharge status: Home / Self Care | Payer: 59 | Source: Ambulatory Visit | Attending: General Surgery | Admitting: General Surgery

## 2017-06-21 ENCOUNTER — Encounter (HOSPITAL_COMMUNITY): Payer: Self-pay

## 2017-06-21 DIAGNOSIS — Z01818 Encounter for other preprocedural examination: Secondary | ICD-10-CM | POA: Diagnosis present

## 2017-06-21 DIAGNOSIS — K8050 Calculus of bile duct without cholangitis or cholecystitis without obstruction: Secondary | ICD-10-CM | POA: Insufficient documentation

## 2017-06-21 HISTORY — DX: Other complications of anesthesia, initial encounter: T88.59XA

## 2017-06-21 HISTORY — DX: Attention-deficit hyperactivity disorder, unspecified type: F90.9

## 2017-06-21 HISTORY — DX: Other seasonal allergic rhinitis: J30.2

## 2017-06-21 HISTORY — DX: Sleep apnea, unspecified: G47.30

## 2017-06-21 HISTORY — DX: Adverse effect of unspecified anesthetic, initial encounter: T41.45XA

## 2017-06-21 LAB — COMPREHENSIVE METABOLIC PANEL
ALT: 53 U/L (ref 14–54)
AST: 35 U/L (ref 15–41)
Albumin: 3.9 g/dL (ref 3.5–5.0)
Alkaline Phosphatase: 93 U/L (ref 38–126)
Anion gap: 5 (ref 5–15)
BUN: 13 mg/dL (ref 6–20)
CO2: 28 mmol/L (ref 22–32)
Calcium: 9.1 mg/dL (ref 8.9–10.3)
Chloride: 108 mmol/L (ref 101–111)
Creatinine, Ser: 0.86 mg/dL (ref 0.44–1.00)
GFR calc Af Amer: >60 mL/min (ref >60)
GFR calc non Af Amer: >60 mL/min (ref >60)
Glucose, Bld: 99 mg/dL (ref 65–99)
Potassium: 4 mmol/L (ref 3.5–5.1)
Sodium: 141 mmol/L (ref 135–145)
Total Bilirubin: 0.5 mg/dL (ref 0.3–1.2)
Total Protein: 7.4 g/dL (ref 6.5–8.1)

## 2017-06-21 LAB — HCG, SERUM, QUALITATIVE: Preg, Serum: NEGATIVE

## 2017-06-21 NOTE — Progress Notes (Signed)
CBC diff 06-04-17 epic CMP 06-04-17 (will repeat at pre-op d/t low potassium result)

## 2017-06-25 ENCOUNTER — Inpatient Hospital Stay (HOSPITAL_COMMUNITY): Admission: RE | Admit: 2017-06-25 | Payer: 59 | Source: Ambulatory Visit

## 2017-06-30 ENCOUNTER — Encounter (HOSPITAL_COMMUNITY): Payer: Self-pay | Admitting: *Deleted

## 2017-06-30 ENCOUNTER — Ambulatory Visit (HOSPITAL_COMMUNITY)
Admission: RE | Admit: 2017-06-30 | Discharge: 2017-06-30 | Disposition: A | Discharge status: Home / Self Care | Payer: 59 | Source: Ambulatory Visit | Attending: General Surgery | Admitting: General Surgery

## 2017-06-30 ENCOUNTER — Ambulatory Visit (HOSPITAL_COMMUNITY): Payer: 59 | Admitting: Anesthesiology

## 2017-06-30 ENCOUNTER — Encounter (HOSPITAL_COMMUNITY)
Admission: RE | Disposition: A | Discharge status: Home / Self Care | Payer: Self-pay | Source: Ambulatory Visit | Attending: General Surgery

## 2017-06-30 DIAGNOSIS — I10 Essential (primary) hypertension: Secondary | ICD-10-CM | POA: Diagnosis not present

## 2017-06-30 DIAGNOSIS — F419 Anxiety disorder, unspecified: Secondary | ICD-10-CM | POA: Insufficient documentation

## 2017-06-30 DIAGNOSIS — Z888 Allergy status to other drugs, medicaments and biological substances status: Secondary | ICD-10-CM | POA: Diagnosis not present

## 2017-06-30 DIAGNOSIS — Z6841 Body Mass Index (BMI) 40.0 and over, adult: Secondary | ICD-10-CM | POA: Diagnosis not present

## 2017-06-30 DIAGNOSIS — K8064 Calculus of gallbladder and bile duct with chronic cholecystitis without obstruction: Secondary | ICD-10-CM | POA: Insufficient documentation

## 2017-06-30 DIAGNOSIS — E282 Polycystic ovarian syndrome: Secondary | ICD-10-CM | POA: Diagnosis not present

## 2017-06-30 DIAGNOSIS — K219 Gastro-esophageal reflux disease without esophagitis: Secondary | ICD-10-CM | POA: Insufficient documentation

## 2017-06-30 DIAGNOSIS — G4730 Sleep apnea, unspecified: Secondary | ICD-10-CM | POA: Diagnosis not present

## 2017-06-30 DIAGNOSIS — Z9989 Dependence on other enabling machines and devices: Secondary | ICD-10-CM | POA: Insufficient documentation

## 2017-06-30 DIAGNOSIS — F329 Major depressive disorder, single episode, unspecified: Secondary | ICD-10-CM | POA: Insufficient documentation

## 2017-06-30 DIAGNOSIS — Z885 Allergy status to narcotic agent status: Secondary | ICD-10-CM | POA: Diagnosis not present

## 2017-06-30 DIAGNOSIS — F909 Attention-deficit hyperactivity disorder, unspecified type: Secondary | ICD-10-CM | POA: Diagnosis not present

## 2017-06-30 DIAGNOSIS — Z79899 Other long term (current) drug therapy: Secondary | ICD-10-CM | POA: Insufficient documentation

## 2017-06-30 DIAGNOSIS — K8050 Calculus of bile duct without cholangitis or cholecystitis without obstruction: Secondary | ICD-10-CM | POA: Diagnosis present

## 2017-06-30 HISTORY — PX: CHOLECYSTECTOMY: SHX55

## 2017-06-30 SURGERY — LAPAROSCOPIC CHOLECYSTECTOMY
Anesthesia: General | Site: Abdomen

## 2017-06-30 MED ORDER — ROCURONIUM BROMIDE 50 MG/5ML IV SOSY
PREFILLED_SYRINGE | INTRAVENOUS | Status: AC
  Filled 2017-06-30: qty 5

## 2017-06-30 MED ORDER — HEPARIN SODIUM (PORCINE) 5000 UNIT/ML IJ SOLN
5000.0000 [IU] | Freq: Once | INTRAMUSCULAR | Status: AC
  Administered 2017-06-30: 5000 [IU] via SUBCUTANEOUS
  Filled 2017-06-30: qty 1

## 2017-06-30 MED ORDER — ACETAMINOPHEN 500 MG PO TABS
1000.0000 mg | ORAL_TABLET | ORAL | Status: AC
  Administered 2017-06-30: 1000 mg via ORAL
  Filled 2017-06-30: qty 2

## 2017-06-30 MED ORDER — MIDAZOLAM HCL 5 MG/5ML IJ SOLN
INTRAMUSCULAR | Status: DC | PRN
  Administered 2017-06-30: 2 mg via INTRAVENOUS

## 2017-06-30 MED ORDER — CEFOTETAN DISODIUM-DEXTROSE 2-2.08 GM-% IV SOLR
2.0000 g | INTRAVENOUS | Status: AC
  Administered 2017-06-30: 2 g via INTRAVENOUS

## 2017-06-30 MED ORDER — ONDANSETRON HCL 4 MG/2ML IJ SOLN
INTRAMUSCULAR | Status: AC
  Filled 2017-06-30: qty 2

## 2017-06-30 MED ORDER — CHLORHEXIDINE GLUCONATE CLOTH 2 % EX PADS: 6.0000 | MEDICATED_PAD | Freq: Once | CUTANEOUS | Status: DC

## 2017-06-30 MED ORDER — FENTANYL CITRATE (PF) 100 MCG/2ML IJ SOLN
25.0000 ug | INTRAMUSCULAR | Status: DC | PRN
  Administered 2017-06-30 (×3): 50 ug via INTRAVENOUS

## 2017-06-30 MED ORDER — FENTANYL CITRATE (PF) 100 MCG/2ML IJ SOLN
INTRAMUSCULAR | Status: DC | PRN
  Administered 2017-06-30 (×4): 50 ug via INTRAVENOUS

## 2017-06-30 MED ORDER — FENTANYL CITRATE (PF) 100 MCG/2ML IJ SOLN
INTRAMUSCULAR | Status: AC
  Filled 2017-06-30: qty 2

## 2017-06-30 MED ORDER — GLYCOPYRROLATE 0.2 MG/ML IV SOSY
PREFILLED_SYRINGE | INTRAVENOUS | Status: AC
  Filled 2017-06-30: qty 5

## 2017-06-30 MED ORDER — 0.9 % SODIUM CHLORIDE (POUR BTL) OPTIME
TOPICAL | Status: DC | PRN
  Administered 2017-06-30: 1000 mL

## 2017-06-30 MED ORDER — BUPIVACAINE-EPINEPHRINE 0.25% -1:200000 IJ SOLN
INTRAMUSCULAR | Status: DC | PRN
  Administered 2017-06-30: 50 mL

## 2017-06-30 MED ORDER — HYDROCODONE-ACETAMINOPHEN 5-325 MG PO TABS
1.0000 | ORAL_TABLET | Freq: Once | ORAL | Status: AC
  Administered 2017-06-30: 1 via ORAL
  Filled 2017-06-30: qty 1

## 2017-06-30 MED ORDER — MIDAZOLAM HCL 2 MG/2ML IJ SOLN
INTRAMUSCULAR | Status: AC
  Filled 2017-06-30: qty 2

## 2017-06-30 MED ORDER — GABAPENTIN 300 MG PO CAPS
300.0000 mg | ORAL_CAPSULE | ORAL | Status: AC
  Administered 2017-06-30: 300 mg via ORAL
  Filled 2017-06-30: qty 1

## 2017-06-30 MED ORDER — GLYCOPYRROLATE 0.2 MG/ML IJ SOLN
INTRAMUSCULAR | Status: DC | PRN
  Administered 2017-06-30: 0.6 mg via INTRAVENOUS

## 2017-06-30 MED ORDER — NEOSTIGMINE METHYLSULFATE 5 MG/5ML IV SOSY
PREFILLED_SYRINGE | INTRAVENOUS | Status: AC
  Filled 2017-06-30: qty 5

## 2017-06-30 MED ORDER — DEXAMETHASONE SODIUM PHOSPHATE 10 MG/ML IJ SOLN
INTRAMUSCULAR | Status: DC | PRN
  Administered 2017-06-30: 10 mg via INTRAVENOUS

## 2017-06-30 MED ORDER — LIDOCAINE 2% (20 MG/ML) 5 ML SYRINGE
INTRAMUSCULAR | Status: AC
  Filled 2017-06-30: qty 5

## 2017-06-30 MED ORDER — LIDOCAINE HCL (PF) 2 % IJ SOLN
INTRAMUSCULAR | Status: DC | PRN
  Administered 2017-06-30: 5 mL via INTRADERMAL

## 2017-06-30 MED ORDER — PROMETHAZINE HCL 25 MG/ML IJ SOLN: 6.2500 mg | INTRAMUSCULAR | Status: DC | PRN

## 2017-06-30 MED ORDER — HYDROCODONE-ACETAMINOPHEN 5-325 MG PO TABS: 1.0000 | ORAL_TABLET | Freq: Four times a day (QID) | ORAL | 0 refills | Status: DC | PRN

## 2017-06-30 MED ORDER — IBUPROFEN 800 MG PO TABS: 800.0000 mg | ORAL_TABLET | Freq: Three times a day (TID) | ORAL | 0 refills | Status: DC | PRN

## 2017-06-30 MED ORDER — ONDANSETRON HCL 4 MG/2ML IJ SOLN
INTRAMUSCULAR | Status: DC | PRN
  Administered 2017-06-30: 4 mg via INTRAVENOUS

## 2017-06-30 MED ORDER — SCOPOLAMINE 1 MG/3DAYS TD PT72
MEDICATED_PATCH | TRANSDERMAL | Status: AC
  Filled 2017-06-30: qty 1

## 2017-06-30 MED ORDER — ROCURONIUM BROMIDE 10 MG/ML (PF) SYRINGE
PREFILLED_SYRINGE | INTRAVENOUS | Status: DC | PRN
  Administered 2017-06-30: 10 mg via INTRAVENOUS
  Administered 2017-06-30: 30 mg via INTRAVENOUS

## 2017-06-30 MED ORDER — BUPIVACAINE LIPOSOME 1.3 % IJ SUSP
20.0000 mL | Freq: Once | INTRAMUSCULAR | Status: DC
  Filled 2017-06-30: qty 20

## 2017-06-30 MED ORDER — LACTATED RINGERS IR SOLN
Status: DC | PRN
  Administered 2017-06-30: 1000 mL

## 2017-06-30 MED ORDER — PROPOFOL 10 MG/ML IV BOLUS
INTRAVENOUS | Status: AC
  Filled 2017-06-30: qty 20

## 2017-06-30 MED ORDER — CEFOTETAN DISODIUM-DEXTROSE 2-2.08 GM-% IV SOLR
INTRAVENOUS | Status: AC
  Filled 2017-06-30: qty 50

## 2017-06-30 MED ORDER — PROPOFOL 10 MG/ML IV BOLUS
INTRAVENOUS | Status: DC | PRN
  Administered 2017-06-30: 200 mg via INTRAVENOUS

## 2017-06-30 MED ORDER — CELECOXIB 200 MG PO CAPS
400.0000 mg | ORAL_CAPSULE | ORAL | Status: AC
  Administered 2017-06-30: 400 mg via ORAL
  Filled 2017-06-30: qty 2

## 2017-06-30 MED ORDER — SUCCINYLCHOLINE CHLORIDE 200 MG/10ML IV SOSY
PREFILLED_SYRINGE | INTRAVENOUS | Status: DC | PRN
  Administered 2017-06-30: 140 mg via INTRAVENOUS

## 2017-06-30 MED ORDER — NEOSTIGMINE METHYLSULFATE 10 MG/10ML IV SOLN
INTRAVENOUS | Status: DC | PRN
  Administered 2017-06-30: 5 mg via INTRAVENOUS

## 2017-06-30 MED ORDER — BUPIVACAINE-EPINEPHRINE 0.25% -1:200000 IJ SOLN
INTRAMUSCULAR | Status: AC
  Filled 2017-06-30: qty 1

## 2017-06-30 MED ORDER — LIDOCAINE 2% (20 MG/ML) 5 ML SYRINGE
INTRAMUSCULAR | Status: DC | PRN
  Administered 2017-06-30: 100 mg via INTRAVENOUS

## 2017-06-30 MED ORDER — FENTANYL CITRATE (PF) 100 MCG/2ML IJ SOLN
INTRAMUSCULAR | Status: AC
  Administered 2017-06-30: 50 ug via INTRAVENOUS
  Filled 2017-06-30: qty 4

## 2017-06-30 MED ORDER — LACTATED RINGERS IV SOLN
INTRAVENOUS | Status: DC | PRN
  Administered 2017-06-30 (×2): via INTRAVENOUS

## 2017-06-30 MED ORDER — DEXAMETHASONE SODIUM PHOSPHATE 10 MG/ML IJ SOLN
INTRAMUSCULAR | Status: AC
  Filled 2017-06-30: qty 1

## 2017-06-30 MED ORDER — IOPAMIDOL (ISOVUE-300) INJECTION 61%
INTRAVENOUS | Status: AC
  Filled 2017-06-30: qty 50

## 2017-06-30 SURGICAL SUPPLY — 45 items
APPLIER CLIP ROT 10 11.4 M/L (STAPLE)
BANDAGE ADH SHEER 1  50/CT (GAUZE/BANDAGES/DRESSINGS) ×15 IMPLANT
BENZOIN TINCTURE PRP APPL 2/3 (GAUZE/BANDAGES/DRESSINGS) ×3 IMPLANT
CABLE HIGH FREQUENCY MONO STRZ (ELECTRODE) ×3 IMPLANT
CATH CHOLANG 76X19 KUMAR (CATHETERS) IMPLANT
CHLORAPREP W/TINT 26ML (MISCELLANEOUS) ×3 IMPLANT
CLIP APPLIE ROT 10 11.4 M/L (STAPLE) IMPLANT
CLIP LIGATING HEM O LOK PURPLE (MISCELLANEOUS) ×3 IMPLANT
CLIP LIGATING HEMO LOK XL GOLD (MISCELLANEOUS) IMPLANT
CLOSURE WOUND 1/2 X4 (GAUZE/BANDAGES/DRESSINGS) ×1
COVER MAYO STAND STRL (DRAPES) IMPLANT
COVER SURGICAL LIGHT HANDLE (MISCELLANEOUS) ×3 IMPLANT
DECANTER SPIKE VIAL GLASS SM (MISCELLANEOUS) ×3 IMPLANT
DRAIN CHANNEL 19F RND (DRAIN) IMPLANT
DRAPE C-ARM 42X120 X-RAY (DRAPES) IMPLANT
EVACUATOR SILICONE 100CC (DRAIN) IMPLANT
GAUZE SPONGE 2X2 8PLY STRL LF (GAUZE/BANDAGES/DRESSINGS) ×1 IMPLANT
GLOVE BIOGEL PI IND STRL 7.0 (GLOVE) ×1 IMPLANT
GLOVE BIOGEL PI INDICATOR 7.0 (GLOVE) ×2
GLOVE SURG SS PI 7.0 STRL IVOR (GLOVE) ×3 IMPLANT
GOWN STRL REUS W/TWL LRG LVL3 (GOWN DISPOSABLE) ×3 IMPLANT
GOWN STRL REUS W/TWL XL LVL3 (GOWN DISPOSABLE) ×6 IMPLANT
GRASPER SUT TROCAR 14GX15 (MISCELLANEOUS) ×3 IMPLANT
IRRIG SUCT STRYKERFLOW 2 WTIP (MISCELLANEOUS)
IRRIGATION SUCT STRKRFLW 2 WTP (MISCELLANEOUS) IMPLANT
KIT BASIN OR (CUSTOM PROCEDURE TRAY) ×3 IMPLANT
POUCH RETRIEVAL ECOSAC 10 (ENDOMECHANICALS) ×1 IMPLANT
POUCH RETRIEVAL ECOSAC 10MM (ENDOMECHANICALS) ×2
SCISSORS LAP 5X35 DISP (ENDOMECHANICALS) ×3 IMPLANT
SET IRRIG TUBING LAPAROSCOPIC (IRRIGATION / IRRIGATOR) ×3 IMPLANT
SHEARS HARMONIC ACE PLUS 36CM (ENDOMECHANICALS) IMPLANT
SLEEVE XCEL OPT CAN 5 100 (ENDOMECHANICALS) ×6 IMPLANT
SPONGE GAUZE 2X2 STER 10/PKG (GAUZE/BANDAGES/DRESSINGS) ×2
STOPCOCK 4 WAY LG BORE MALE ST (IV SETS) IMPLANT
STRIP CLOSURE SKIN 1/2X4 (GAUZE/BANDAGES/DRESSINGS) ×2 IMPLANT
SUT ETHILON 2 0 PS N (SUTURE) IMPLANT
SUT MNCRL AB 4-0 PS2 18 (SUTURE) ×3 IMPLANT
SUT VICRYL 0 ENDOLOOP (SUTURE) ×9 IMPLANT
TAPE PAPER 3X10 WHT MICROPORE (GAUZE/BANDAGES/DRESSINGS) ×3 IMPLANT
TOWEL OR 17X26 10 PK STRL BLUE (TOWEL DISPOSABLE) ×3 IMPLANT
TOWEL OR NON WOVEN STRL DISP B (DISPOSABLE) ×3 IMPLANT
TRAY LAPAROSCOPIC (CUSTOM PROCEDURE TRAY) ×3 IMPLANT
TROCAR BLADELESS OPT 5 100 (ENDOMECHANICALS) ×3 IMPLANT
TROCAR XCEL 12X100 BLDLESS (ENDOMECHANICALS) ×3 IMPLANT
TUBING INSUF HEATED (TUBING) ×3 IMPLANT

## 2017-06-30 NOTE — Anesthesia Preprocedure Evaluation (Addendum)
Anesthesia Evaluation  Patient identified by MRN, date of birth, ID band Patient awake    Reviewed: Allergy & Precautions, NPO status , Patient's Chart, lab work & pertinent test results, reviewed documented beta blocker date and time   Airway Mallampati: II  TM Distance: >3 FB Neck ROM: Full    Dental  (+) Teeth Intact, Dental Advisory Given   Pulmonary sleep apnea and Continuous Positive Airway Pressure Ventilation ,    Pulmonary exam normal breath sounds clear to auscultation       Cardiovascular hypertension, Normal cardiovascular exam Rhythm:Regular Rate:Normal     Neuro/Psych PSYCHIATRIC DISORDERS Anxiety Depression  Neuromuscular disease    GI/Hepatic Neg liver ROS, GERD  Medicated,  Endo/Other  Morbid obesity  Renal/GU negative Renal ROS     Musculoskeletal negative musculoskeletal ROS (+)   Abdominal   Peds  Hematology negative hematology ROS (+)   Anesthesia Other Findings Day of surgery medications reviewed with the patient.  Reproductive/Obstetrics negative OB ROS                            Anesthesia Physical Anesthesia Plan  ASA: II  Anesthesia Plan: General   Post-op Pain Management:    Induction: Intravenous  PONV Risk Score and Plan: 3 and Ondansetron, Dexamethasone, Midazolam and Scopolamine patch - Pre-op  Airway Management Planned: Oral ETT  Additional Equipment:   Intra-op Plan:   Post-operative Plan: Extubation in OR  Informed Consent: I have reviewed the patients History and Physical, chart, labs and discussed the procedure including the risks, benefits and alternatives for the proposed anesthesia with the patient or authorized representative who has indicated his/her understanding and acceptance.   Dental advisory given  Plan Discussed with: CRNA  Anesthesia Plan Comments: (Risks/benefits of general anesthesia discussed with patient including risk of  damage to teeth, lips, gum, and tongue, nausea/vomiting, allergic reactions to medications, and the possibility of heart attack, stroke and death.  All patient questions answered.  Patient wishes to proceed.)        Anesthesia Quick Evaluation

## 2017-06-30 NOTE — Discharge Instructions (Signed)
General Anesthesia, Adult, Care After °These instructions provide you with information about caring for yourself after your procedure. Your health care provider may also give you more specific instructions. Your treatment has been planned according to current medical practices, but problems sometimes occur. Call your health care provider if you have any problems or questions after your procedure. °What can I expect after the procedure? °After the procedure, it is common to have: °· Vomiting. °· A sore throat. °· Mental slowness. ° °It is common to feel: °· Nauseous. °· Cold or shivery. °· Sleepy. °· Tired. °· Sore or achy, even in parts of your body where you did not have surgery. ° °Follow these instructions at home: °For at least 24 hours after the procedure: °· Do not: °? Participate in activities where you could fall or become injured. °? Drive. °? Use heavy machinery. °? Drink alcohol. °? Take sleeping pills or medicines that cause drowsiness. °? Make important decisions or sign legal documents. °? Take care of children on your own. °· Rest. °Eating and drinking °· If you vomit, drink water, juice, or soup when you can drink without vomiting. °· Drink enough fluid to keep your urine clear or pale yellow. °· Make sure you have little or no nausea before eating solid foods. °· Follow the diet recommended by your health care provider. °General instructions °· Have a responsible adult stay with you until you are awake and alert. °· Return to your normal activities as told by your health care provider. Ask your health care provider what activities are safe for you. °· Take over-the-counter and prescription medicines only as told by your health care provider. °· If you smoke, do not smoke without supervision. °· Keep all follow-up visits as told by your health care provider. This is important. °Contact a health care provider if: °· You continue to have nausea or vomiting at home, and medicines are not helpful. °· You  cannot drink fluids or start eating again. °· You cannot urinate after 8-12 hours. °· You develop a skin rash. °· You have fever. °· You have increasing redness at the site of your procedure. °Get help right away if: °· You have difficulty breathing. °· You have chest pain. °· You have unexpected bleeding. °· You feel that you are having a life-threatening or urgent problem. °This information is not intended to replace advice given to you by your health care provider. Make sure you discuss any questions you have with your health care provider. °Document Released: 02/22/2001 Document Revised: 04/20/2016 Document Reviewed: 10/31/2015 °Elsevier Interactive Patient Education © 2018 Elsevier Inc. ° °

## 2017-06-30 NOTE — Op Note (Signed)
PATIENT:  Maria Bright  41 y.o. female  PRE-OPERATIVE DIAGNOSIS:  biliary colic  POST-OPERATIVE DIAGNOSIS:  biliary colic  PROCEDURE:  Procedure(s): LAPAROSCOPIC CHOLECYSTECTOMY   SURGEON:  Surgeon(s): Cyris Maalouf, Arta Bruce, MD  ASSISTANT: none  ANESTHESIA:   local and general  Indications for procedure: [name] is a 41 y.o. female with symptoms of Abdominal pain and Nausea and vomiting consistent with gallbladder disease, Confirmed by Ultrasound and CT.  Description of procedure: The patient was brought into the operative suite, placed supine. Anesthesia was administered with endotracheal tube. Patient was strapped in place and foot board was secured. All pressure points were offloaded by foam padding. The patient was prepped and draped in the usual sterile fashion.  A small incision was made to the right of the umbilicus. A 72mm trocar was inserted into the peritoneal cavity with optical entry. Pneumoperitoneum was applied with high flow low pressure. 2 29mm trocars were placed in the RUQ. A 22mm trocar was placed in the subxiphoid space. All trocars sites were first anesthesized with 0.25% marcaine with epinephrine in the subcutaneous and preperitoneal layers. Next the patient was placed in reverse trendelenberg. The gallbladder was white and erythematous with adhesions to the cystic duct.  The gallbladder was retracted cephalad and lateral. The peritoneum was reflected off the infundibulum working lateral to medial. The cystic duct and cystic artery were identified and further dissection revealed a critical view. The cystic duct and cystic artery were doubly clipped and ligated. However, the cystic duct was too large to hold a 31mm hemolock clip. Therefore, a ductotomy was made to drain some bile and then 2 0 PDS endoloops were used to close the cystic stump. All bile was removed with suction, so stones spilled from the duct or gallbladder.  The gallbladder was removed off the  liver bed with cautery. The Gallbladder was placed in a specimen bag. The gallbladder fossa was irrigated and hemostasis was applied with cautery. The gallbladder was removed via the 69mm trocar. The fascial defect was closed with interrupted 0 vicryl suture via laparoscopic trans-fascial suture passer. Pneumoperitoneum was removed, all trocar were removed. All incisions were closed with 4-0 monocryl subcuticular stitch. The patient woke from anesthesia and was brought to PACU in stable condition. All counts were correct  Findings: inflamed gallbladder with slightly dilated cystic duct.  Specimen: gallbladder  Blood loss: Total I/O In: 1000 [I.V.:1000] Out: 100 [Blood:100] ml  Local anesthesia: 30 ml 3.1% marcaine  Complications: none  PLAN OF CARE: Discharge to home after PACU  PATIENT DISPOSITION:  PACU - hemodynamically stable.  Gurney Maxin, M.D. General, Bariatric, & Minimally Invasive Surgery [facility]

## 2017-06-30 NOTE — Anesthesia Postprocedure Evaluation (Signed)
Anesthesia Post Note  Patient: Maria Bright  Procedure(s) Performed: Procedure(s) (LRB): LAPAROSCOPIC CHOLECYSTECTOMY (N/A)     Patient location during evaluation: PACU Anesthesia Type: General Level of consciousness: awake and alert Pain management: pain level controlled Vital Signs Assessment: post-procedure vital signs reviewed and stable Respiratory status: spontaneous breathing, nonlabored ventilation and respiratory function stable Cardiovascular status: blood pressure returned to baseline and stable Postop Assessment: no signs of nausea or vomiting Anesthetic complications: no    Last Vitals:  Vitals:   06/30/17 1045 06/30/17 1135  BP: 133/76 123/76  Pulse: 78 69  Resp: 14 16  Temp: 36.7 C 36.8 C    Last Pain:  Vitals:   06/30/17 1135  TempSrc: Oral  PainSc: 4                  Catalina Gravel

## 2017-06-30 NOTE — H&P (Signed)
Maria Bright is an 41 y.o. female.   Chief Complaint: abdominal pain HPI: 41 yo female with multiple bouts of biliary colic. She was diagnosed with gallstones. She has been on a low fat diet and avoiding many attacks and presents today for laparoscopic cholecystectomy.  Past Medical History:  Diagnosis Date  . ADHD (attention deficit hyperactivity disorder)   . Anxiety   . Complication of anesthesia    30 years ago ; woke up in middle of wisdom tooth extraction   . Depression   . GERD (gastroesophageal reflux disease)   . Hypertension   . Polycystic disease, ovaries    POC   . Seasonal allergies   . Sleep apnea    CPAP     Past Surgical History:  Procedure Laterality Date  . WISDOM TOOTH EXTRACTION      Family History  Problem Relation Age of Onset  . Arthritis Mother   . Hyperlipidemia Mother   . Heart disease Mother   . Hypertension Mother   . Rheum arthritis Maternal Aunt   . Hyperlipidemia Paternal Aunt   . Diabetes Paternal Aunt   . Rheum arthritis Maternal Grandmother   . Heart disease Maternal Grandmother   . Arthritis Maternal Grandfather   . Colon cancer Maternal Grandfather   . Hyperlipidemia Maternal Grandfather   . Heart disease Maternal Grandfather   . Hypertension Maternal Grandfather    Social History:  reports that she has never smoked. She has never used smokeless tobacco. She reports that she does not drink alcohol or use drugs.  Allergies:  Allergies  Allergen Reactions  . Morphine And Related Other (See Comments)    Unknown   . Adhesive [Tape] Rash    blisters  . Nickel Rash    Medications Prior to Admission  Medication Sig Dispense Refill  . buPROPion (WELLBUTRIN XL) 300 MG 24 hr tablet Take 300 mg by mouth daily.    . cetirizine (ZYRTEC) 10 MG tablet Take 10 mg by mouth at bedtime.     Marland Kitchen desvenlafaxine (PRISTIQ) 100 MG 24 hr tablet Take 100 mg by mouth every evening.     . diphenhydrAMINE (BENADRYL) 25 MG tablet Take 50 mg by mouth  every 6 (six) hours as needed for itching or allergies.    Marland Kitchen gabapentin (NEURONTIN) 300 MG capsule Take 300 mg by mouth at bedtime.    Marland Kitchen HYDROcodone-acetaminophen (NORCO/VICODIN) 5-325 MG tablet Take 1 tablet by mouth every 6 (six) hours as needed for severe pain. 12 tablet 0  . ibuprofen (ADVIL,MOTRIN) 200 MG tablet Take 800 mg by mouth every 6 (six) hours as needed for moderate pain.    Marland Kitchen lisdexamfetamine (VYVANSE) 20 MG capsule Take 20 mg by mouth 2 (two) times daily.    . norethindrone-ethinyl estradiol (JUNEL FE,GILDESS FE,LOESTRIN FE) 1-20 MG-MCG tablet TAKE 1 TABLET BY MOUTH EVERY DAY    . propranolol (INDERAL) 10 MG tablet Take 10 mg by mouth daily as needed (anxiety). Anxiety    . ranitidine (ZANTAC) 150 MG tablet Take 150 mg by mouth daily.    Marland Kitchen omeprazole (PRILOSEC) 20 MG capsule Take 1 capsule (20 mg total) by mouth daily. (Patient not taking: Reported on 06/18/2017) 60 capsule 0    No results found for this or any previous visit (from the past 48 hour(s)). No results found.  Review of Systems  Constitutional: Negative for chills and fever.  HENT: Negative for hearing loss.   Eyes: Negative for blurred vision and double vision.  Respiratory:  Negative for cough and hemoptysis.   Cardiovascular: Negative for chest pain and palpitations.  Gastrointestinal: Negative for abdominal pain, nausea and vomiting.  Genitourinary: Negative for dysuria and urgency.  Musculoskeletal: Negative for myalgias and neck pain.  Skin: Negative for itching and rash.  Neurological: Negative for dizziness, tingling and headaches.  Endo/Heme/Allergies: Does not bruise/bleed easily.  Psychiatric/Behavioral: Negative for depression and suicidal ideas.    Blood pressure 132/81, pulse 91, temperature 98.3 F (36.8 C), temperature source Oral, resp. rate 18, height 5\' 6"  (1.676 m), weight 127.5 kg (281 lb), last menstrual period 06/16/2017, SpO2 99 %. Physical Exam  Vitals reviewed. Constitutional: She  is oriented to person, place, and time. She appears well-developed and well-nourished.  HENT:  Head: Normocephalic and atraumatic.  Eyes: Pupils are equal, round, and reactive to light. Conjunctivae and EOM are normal.  Neck: Normal range of motion. Neck supple.  Cardiovascular: Normal rate and regular rhythm.   Respiratory: Effort normal and breath sounds normal.  GI: Soft. Bowel sounds are normal. She exhibits no distension. There is no tenderness.  Musculoskeletal: Normal range of motion.  Neurological: She is alert and oriented to person, place, and time.  Skin: Skin is warm and dry.  Psychiatric: She has a normal mood and affect. Her behavior is normal.     Assessment/Plan 41 yo female with chronic cholecystitis -lap chole -outpatient  Mickeal Skinner, MD 06/30/2017, 8:02 AM

## 2017-06-30 NOTE — Transfer of Care (Signed)
Immediate Anesthesia Transfer of Care Note  Patient: Maria Bright  Procedure(s) Performed: Procedure(s): LAPAROSCOPIC CHOLECYSTECTOMY (N/A)  Patient Location: PACU  Anesthesia Type:General  Level of Consciousness: sedated  Airway & Oxygen Therapy: Patient Spontanous Breathing and Patient connected to face mask oxygen  Post-op Assessment: Report given to RN and Post -op Vital signs reviewed and stable  Post vital signs: Reviewed and stable  Last Vitals:  Vitals:   06/30/17 0635  BP: 132/81  Pulse: 91  Resp: 18  Temp: 36.8 C    Last Pain:  Vitals:   06/30/17 0635  TempSrc: Oral      Patients Stated Pain Goal: 4 (18/48/59 2763)  Complications: No apparent anesthesia complications

## 2017-06-30 NOTE — Anesthesia Procedure Notes (Signed)
Procedure Name: Intubation Date/Time: 06/30/2017 8:34 AM Performed by: Lind Covert Pre-anesthesia Checklist: Patient identified, Emergency Drugs available, Suction available, Patient being monitored and Timeout performed Patient Re-evaluated:Patient Re-evaluated prior to induction Oxygen Delivery Method: Circle system utilized Preoxygenation: Pre-oxygenation with 100% oxygen Induction Type: IV induction Laryngoscope Size: Mac and 4 Grade View: Grade I Tube type: Oral Number of attempts: 1 Airway Equipment and Method: Stylet and LTA kit utilized Placement Confirmation: ETT inserted through vocal cords under direct vision,  positive ETCO2 and breath sounds checked- equal and bilateral Secured at: 22 cm Tube secured with: Tape Dental Injury: Teeth and Oropharynx as per pre-operative assessment

## 2017-07-23 ENCOUNTER — Encounter: Payer: 59 | Admitting: Internal Medicine

## 2017-07-23 ENCOUNTER — Ambulatory Visit: Payer: 59 | Admitting: Gastroenterology

## 2017-07-23 ENCOUNTER — Encounter: Payer: Self-pay | Admitting: Gastroenterology

## 2017-09-03 ENCOUNTER — Encounter: Payer: 59 | Admitting: Internal Medicine

## 2017-09-06 ENCOUNTER — Ambulatory Visit (INDEPENDENT_AMBULATORY_CARE_PROVIDER_SITE_OTHER): Payer: 59 | Admitting: Gastroenterology

## 2017-09-06 ENCOUNTER — Encounter (INDEPENDENT_AMBULATORY_CARE_PROVIDER_SITE_OTHER): Payer: Self-pay

## 2017-09-06 ENCOUNTER — Encounter: Payer: Self-pay | Admitting: Gastroenterology

## 2017-09-06 VITALS — BP 130/83 | HR 111 | Temp 98.1°F | Ht 66.0 in | Wt 288.4 lb

## 2017-09-06 DIAGNOSIS — K9049 Malabsorption due to intolerance, not elsewhere classified: Secondary | ICD-10-CM

## 2017-09-06 NOTE — Progress Notes (Signed)
Cephas Darby, MD [address]  [place]  [place], [place] [address]  Main: [phone]  Fax: [phone]    Gastroenterology Consultation  Referring Provider:     Jearld Fenton, NP Primary Care Physician:  Jearld Fenton, NP Primary Gastroenterologist:  Dr. Cephas Darby Reason for Consultation:     Food intolerances        HPI:   Maria Bright is a 41 y.o. y/o female referred by Dr. Jearld Fenton, NP  for consultation & management of Food intolerances. She has history of anxiety and depression, ADHD here to discuss about extensive food intolerances like onion, garlic, citrus containing foods and nuts. She denies any GI symptoms per se but experiences skin breakouts particularly on her face and upper chest. She saw a dermatologist more than a year ago and just applies topical creams. She also went on a elimination diet 2 yrs ago, was doing fine and when she reintroduced the nursing foods, it resulted in recurrence of the skin breakouts. She had an elective cholecystectomy in 06/2017 secondary to symptomatic cholelithiasis. Since then, she has been experiencing early morning 1-2 watery bowel movements which are manageable. She denies any other GI symptoms. She is wondering if she has gluten allergy. She has intermittent heartburn controlled on H2 blocker as needed. This was severe prior to cholecystectomy. She does have sinus issues and takes cetirizine  She reports family history of rheumatoid arthritis She does not smoke or drink alcohol  GI Procedures: None  Past Medical History:  Diagnosis Date  . ADHD (attention deficit hyperactivity disorder)   . Anxiety   . Complication of anesthesia    30 years ago ; woke up in middle of wisdom tooth extraction   . Depression   . GERD (gastroesophageal reflux disease)   . Hypertension   . Polycystic disease, ovaries    POC   . Seasonal allergies   . Sleep apnea    CPAP     Past Surgical History:  Procedure  Laterality Date  . CHOLECYSTECTOMY N/A 06/30/2017   Procedure: LAPAROSCOPIC CHOLECYSTECTOMY;  Surgeon: Kinsinger, Arta Bruce, MD;  Location: [facility];  Service: General;  Laterality: N/A;  . WISDOM TOOTH EXTRACTION      Prior to Admission medications   Medication Sig Start Date End Date Taking? Authorizing Provider  buPROPion (WELLBUTRIN XL) 300 MG 24 hr tablet Take 300 mg by mouth daily.   Yes [provider]  cetirizine (ZYRTEC) 10 MG tablet Take 10 mg by mouth at bedtime.    Yes [provider]  desvenlafaxine (PRISTIQ) 100 MG 24 hr tablet Take 100 mg by mouth every evening.    Yes [provider]  diphenhydrAMINE (BENADRYL) 25 MG tablet Take 50 mg by mouth every 6 (six) hours as needed for itching or allergies.   Yes [provider]  gabapentin (NEURONTIN) 300 MG capsule Take 300 mg by mouth at bedtime.   Yes [provider]  HYDROcodone-acetaminophen (NORCO/VICODIN) 5-325 MG tablet Take 1 tablet by mouth every 6 (six) hours as needed for severe pain. 06/05/17  Yes Varney Biles, MD  HYDROcodone-acetaminophen (NORCO/VICODIN) 5-325 MG tablet Take 1-2 tablets by mouth every 6 (six) hours as needed for moderate pain. 06/30/17  Yes Kinsinger, Arta Bruce, MD  ibuprofen (ADVIL,MOTRIN) 800 MG tablet Take 1 tablet (800 mg total) by mouth every 8 (eight) hours as needed. 06/30/17  Yes Kinsinger, Arta Bruce, MD  lisdexamfetamine (VYVANSE) 20 MG capsule Take 20 mg  by mouth 2 (two) times daily.   Yes [provider]  norethindrone-ethinyl estradiol (JUNEL FE,GILDESS FE,LOESTRIN FE) 1-20 MG-MCG tablet TAKE 1 TABLET BY MOUTH EVERY DAY 04/14/16  Yes [provider]  propranolol (INDERAL) 10 MG tablet Take 10 mg by mouth daily as needed (anxiety). Anxiety 06/03/17  Yes [provider]  ranitidine (ZANTAC) 150 MG tablet Take 150 mg by mouth daily.   Yes [provider]    Family History  Problem Relation Age of Onset  . Arthritis Mother    . Hyperlipidemia Mother   . Heart disease Mother   . Hypertension Mother   . Rheum arthritis Maternal Aunt   . Hyperlipidemia Paternal Aunt   . Diabetes Paternal Aunt   . Rheum arthritis Maternal Grandmother   . Heart disease Maternal Grandmother   . Arthritis Maternal Grandfather   . Colon cancer Maternal Grandfather   . Hyperlipidemia Maternal Grandfather   . Heart disease Maternal Grandfather   . Hypertension Maternal Grandfather      Social History  Substance Use Topics  . Smoking status: Never Smoker  . Smokeless tobacco: Never Used  . Alcohol use No     Comment: seldom     Allergies as of 09/06/2017 - Review Complete 09/06/2017  Allergen Reaction Noted  . Morphine and related Other (See Comments) 06/04/2017  . Adhesive [tape] Rash 06/18/2017  . Nickel Rash 09/19/2012    Review of Systems:    All systems reviewed and negative except where noted in HPI.   Physical Exam:  BP 130/83   Pulse (!) 111   Temp 98.1 F (36.7 C) (Oral)   Ht 5\' 6"  (1.676 m)   Wt 288 lb 6.4 oz (130.8 kg)   BMI 46.55 kg/m  No LMP recorded.  General:   Alert,  Well-developed, well-nourished, pleasant and cooperative in NAD Head:  Normocephalic and atraumatic. Eyes:  Sclera clear, no icterus.   Conjunctiva pink. Ears:  Normal auditory acuity. Nose:  No deformity, discharge, or lesions. Mouth:  No deformity or lesions,oropharynx pink & moist. Neck:  Supple; no masses or thyromegaly. Lungs:  Respirations even and unlabored.  Clear throughout to auscultation.   No wheezes, crackles, or rhonchi. No acute distress. Heart:  Regular rate and rhythm; no murmurs, clicks, rubs, or gallops. Abdomen:  Normal bowel sounds.  No bruits.  Soft, non-tender and non-distended without masses, hepatosplenomegaly or hernias noted.  No guarding or rebound tenderness.   Rectal: Nor performed Msk:  Symmetrical without gross deformities. Good, equal movement & strength bilaterally. Pulses:  Normal pulses  noted. Extremities:  No clubbing or edema.  No cyanosis. Neurologic:  Alert and oriented x3;  grossly normal neurologically. Skin:  Intact without significant lesions. No jaundice. Papular eruptions and erythema on bilateral cheeks consistent with rosacea Lymph Nodes:  No significant cervical adenopathy. Psych:  Alert and cooperative. Normal mood and affect.  Imaging Studies: Ultrasound abdomen in 05/2017 showed cholelithiasis. Liver parenchyma with normal echogenicity  Assessment and Plan:   Umi Mainor is a 41 y.o. female with Morbid obesity, ADHD, anxiety and depression with food intolerances, status post cholecystectomy secondary to symptomatic cholelithiasis resulting in bile salt diarrhea which is controllable. She does not have any other GI symptoms. I will check ANA, immunoglobulin panel, IgE levels, celiac serologies. If these are negative, I recommend her to see an immunologist for further evaluation. I suggested her that we will try cholestyramine if the bile salt diarrhea is worsening Advised her to follow low-carb  low-fat diet   Follow up based on above work up   Cephas Darby, MD

## 2017-10-01 ENCOUNTER — Encounter: Payer: Self-pay | Admitting: Internal Medicine

## 2017-10-01 ENCOUNTER — Ambulatory Visit (INDEPENDENT_AMBULATORY_CARE_PROVIDER_SITE_OTHER): Payer: 59 | Admitting: Internal Medicine

## 2017-10-01 VITALS — BP 128/92 | HR 91 | Temp 98.3°F | Ht 66.0 in | Wt 290.0 lb

## 2017-10-01 DIAGNOSIS — Z0000 Encounter for general adult medical examination without abnormal findings: Secondary | ICD-10-CM | POA: Diagnosis not present

## 2017-10-01 DIAGNOSIS — Z91018 Allergy to other foods: Secondary | ICD-10-CM

## 2017-10-01 LAB — COMPREHENSIVE METABOLIC PANEL
ALT: 18 U/L (ref 0–35)
AST: 17 U/L (ref 0–37)
Albumin: 4 g/dL (ref 3.5–5.2)
Alkaline Phosphatase: 72 U/L (ref 39–117)
BUN: 12 mg/dL (ref 6–23)
CO2: 31 mEq/L (ref 19–32)
Calcium: 9.2 mg/dL (ref 8.4–10.5)
Chloride: 104 mEq/L (ref 96–112)
Creatinine, Ser: 0.88 mg/dL (ref 0.40–1.20)
GFR: 76.05 mL/min (ref >60.00)
Glucose, Bld: 89 mg/dL (ref 70–99)
Potassium: 4 mEq/L (ref 3.5–5.1)
Sodium: 138 mEq/L (ref 135–145)
Total Bilirubin: 0.3 mg/dL (ref 0.2–1.2)
Total Protein: 6.9 g/dL (ref 6.0–8.3)

## 2017-10-01 LAB — LIPID PANEL
Cholesterol: 178 mg/dL (ref 0–200)
HDL: 57.7 mg/dL (ref >39.00)
LDL Cholesterol: 95 mg/dL (ref 0–99)
NonHDL: 120.52
Total CHOL/HDL Ratio: 3
Triglycerides: 128 mg/dL (ref 0.0–149.0)
VLDL: 25.6 mg/dL (ref 0.0–40.0)

## 2017-10-01 LAB — CBC
HCT: 42.1 % (ref 36.0–46.0)
Hemoglobin: 13.9 g/dL (ref 12.0–15.0)
MCHC: 33 g/dL (ref 30.0–36.0)
MCV: 89.8 fl (ref 78.0–100.0)
Platelets: 238 10*3/uL (ref 150.0–400.0)
RBC: 4.68 Mil/uL (ref 3.87–5.11)
RDW: 13.6 % (ref 11.5–15.5)
WBC: 6 10*3/uL (ref 4.0–10.5)

## 2017-10-01 LAB — HEMOGLOBIN A1C: Hgb A1c MFr Bld: 5.4 % (ref 4.6–6.5)

## 2017-10-01 NOTE — Patient Instructions (Signed)

## 2017-10-01 NOTE — Progress Notes (Signed)
Subjective:    Patient ID: Maria Bright, female    DOB: 1976-01-16, 41 y.o.   MRN: [id number]  HPI  Pt presents to the clinic today for her annual exam. She would like a referral for an allergist today for further evaluation of her food allergies.  Flu: 08/2016 Tetanus: 11/2009 Pap Smear: 03/2016, [facility] Dentist: biannually  Diet: She does eat meat. She consumes fruits and veggies. She does eat fried foods. She drinks mostly sweet tea.  Exercise: None  Review of Systems      Past Medical History:  Diagnosis Date  . ADHD (attention deficit hyperactivity disorder)   . Anxiety   . Complication of anesthesia    30 years ago ; woke up in middle of wisdom tooth extraction   . Depression   . GERD (gastroesophageal reflux disease)   . Hypertension   . Polycystic disease, ovaries    POC   . Seasonal allergies   . Sleep apnea    CPAP     Current Outpatient Prescriptions  Medication Sig Dispense Refill  . buPROPion (WELLBUTRIN XL) 300 MG 24 hr tablet Take 300 mg by mouth daily.    . cetirizine (ZYRTEC) 10 MG tablet Take 10 mg by mouth at bedtime.     Marland Kitchen desvenlafaxine (PRISTIQ) 100 MG 24 hr tablet Take 100 mg by mouth every evening.     . diphenhydrAMINE (BENADRYL) 25 MG tablet Take 50 mg by mouth every 6 (six) hours as needed for itching or allergies.    Marland Kitchen gabapentin (NEURONTIN) 300 MG capsule Take 300 mg by mouth at bedtime.    Marland Kitchen HYDROcodone-acetaminophen (NORCO/VICODIN) 5-325 MG tablet Take 1 tablet by mouth every 6 (six) hours as needed for severe pain. 12 tablet 0  . HYDROcodone-acetaminophen (NORCO/VICODIN) 5-325 MG tablet Take 1-2 tablets by mouth every 6 (six) hours as needed for moderate pain. 20 tablet 0  . ibuprofen (ADVIL,MOTRIN) 800 MG tablet Take 1 tablet (800 mg total) by mouth every 8 (eight) hours as needed. 30 tablet 0  . lisdexamfetamine (VYVANSE) 20 MG capsule Take 20 mg by mouth 2 (two) times daily.    . norethindrone-ethinyl estradiol (JUNEL FE,GILDESS  FE,LOESTRIN FE) 1-20 MG-MCG tablet TAKE 1 TABLET BY MOUTH EVERY DAY    . propranolol (INDERAL) 10 MG tablet Take 10 mg by mouth daily as needed (anxiety). Anxiety    . ranitidine (ZANTAC) 150 MG tablet Take 150 mg by mouth daily.     No current facility-administered medications for this visit.     Allergies  Allergen Reactions  . Morphine And Related Other (See Comments)    Unknown   . Adhesive [Tape] Rash    blisters  . Nickel Rash    Family History  Problem Relation Age of Onset  . Arthritis Mother   . Hyperlipidemia Mother   . Heart disease Mother   . Hypertension Mother   . Rheum arthritis Maternal Aunt   . Hyperlipidemia Paternal Aunt   . Diabetes Paternal Aunt   . Rheum arthritis Maternal Grandmother   . Heart disease Maternal Grandmother   . Arthritis Maternal Grandfather   . Colon cancer Maternal Grandfather   . Hyperlipidemia Maternal Grandfather   . Heart disease Maternal Grandfather   . Hypertension Maternal Grandfather     Social History   Social History  . Marital status: Married    Spouse name: N/A  . Number of children: N/A  . Years of education: N/A   Occupational History  .  Not on file.   Social History Main Topics  . Smoking status: Never Smoker  . Smokeless tobacco: Never Used  . Alcohol use No     Comment: seldom   . Drug use: No  . Sexual activity: Yes    Birth control/ protection: Pill   Other Topics Concern  . Not on file   Social History Narrative  . No narrative on file     Constitutional: Denies fever, malaise, fatigue, headache or abrupt weight changes.  HEENT: Denies eye pain, eye redness, ear pain, ringing in the ears, wax buildup, runny nose, nasal congestion, bloody nose, or sore throat. Respiratory: Denies difficulty breathing, shortness of breath, cough or sputum production.   Cardiovascular: Denies chest pain, chest tightness, palpitations or swelling in the hands or feet.  Gastrointestinal: Pt reports alternating  constipation and diarrhea. Denies abdominal pain, bloating, or blood in the stool.  GU: Denies urgency, frequency, pain with urination, burning sensation, blood in urine, odor or discharge. Musculoskeletal: Pt reports cervical neck pain (pinched nerve at C6) and low back pain (herniated disc). Denies difficulty with gait, muscle pain or joint  swelling.  Skin: Denies redness, rashes, lesions or ulcercations.  Neurological: Pt reports numbness and tingling in right arm. Denies dizziness, difficulty with memory, difficulty with speech or problems with balance and coordination.  Psych: Pt has history of anxiety and depression. Denies  SI/HI.  No other specific complaints in a complete review of systems (except as listed in HPI above).  Objective:   Physical Exam   BP (!) 128/92   Pulse 91   Temp 98.3 F (36.8 C) (Oral)   Ht 5\' 6"  (1.676 m)   Wt 290 lb (131.5 kg)   LMP 09/21/2017   SpO2 98%   BMI 46.81 kg/m  Wt Readings from Last 3 Encounters:  10/01/17 290 lb (131.5 kg)  09/06/17 288 lb 6.4 oz (130.8 kg)  06/30/17 281 lb (127.5 kg)    General: Appears her stated age, obese in NAD. Skin: Warm, dry and intact. HEENT: Head: normal shape and size; Eyes: sclera white, no icterus, conjunctiva pink, PERRLA and EOMs intact; Ears: Tm's gray and intact, normal light reflex; Throat/Mouth: Teeth present, mucosa pink and moist, no exudate, lesions or ulcerations noted.  Neck:  Neck supple, trachea midline. No masses, lumps or thyromegaly present.  Cardiovascular: Normal rate and rhythm. S1,S2 noted.  No murmur, rubs or gallops noted. No JVD or BLE edema.  Pulmonary/Chest: Normal effort and positive vesicular breath sounds. No respiratory distress. No wheezes, rales or ronchi noted.  Abdomen: Soft and nontender. Normal bowel sounds. No distention or masses noted. Liver, spleen and kidneys non palpable. Musculoskeletal: Strength 5/5 BUE/BLE. She had difficulty getting up from a laying to a sitting  position.  No difficulty with gait.  Neurological: Alert and oriented. Cranial nerves II-XII grossly intact. Coordination normal.  Psychiatric: Mood and affect normal. Behavior is normal. Judgment and thought content normal.     BMET    Component Value Date/Time   NA 141 06/21/2017 1553   K 4.0 06/21/2017 1553   CL 108 06/21/2017 1553   CO2 28 06/21/2017 1553   GLUCOSE 99 06/21/2017 1553   BUN 13 06/21/2017 1553   CREATININE 0.86 06/21/2017 1553   CALCIUM 9.1 06/21/2017 1553   GFRNONAA >60 06/21/2017 1553   GFRAA >60 06/21/2017 1553    Lipid Panel  No results found for: CHOL, TRIG, HDL, CHOLHDL, VLDL, LDLCALC  CBC    Component Value  Date/Time   WBC 9.9 06/04/2017 2253   RBC 4.81 06/04/2017 2253   HGB 14.2 06/04/2017 2253   HCT 41.1 06/04/2017 2253   PLT 260 06/04/2017 2253   MCV 85.4 06/04/2017 2253   MCH 29.5 06/04/2017 2253   MCHC 34.5 06/04/2017 2253   RDW 13.1 06/04/2017 2253   LYMPHSABS 2.7 06/04/2017 2253   MONOABS 0.9 06/04/2017 2253   EOSABS 0.4 06/04/2017 2253   BASOSABS 0.0 06/04/2017 2253    Hgb A1C No results found for: HGBA1C         Assessment & Plan:   Preventative Health Maintenance:  She declines flu shot today Tetanus UTD Pap smear UTD Encouraged her to consume a balanced diet and exercise regimen Advised her to see a dentist annually Will check CBC, CMET, Lipid and A1C today  Food Allergies:  Referral to allergist placed  RTC in 1 year, sooner if needed [name], NP

## 2017-10-04 LAB — TISSUE TRANSGLUTAMINASE, IGG: (tTG) Ab, IgG: 2 U/mL

## 2017-10-04 LAB — ANA: Anti Nuclear Antibody(ANA): NEGATIVE

## 2017-10-04 LAB — TISSUE TRANSGLUTAMINASE, IGA: (tTG) Ab, IgA: 1 U/mL

## 2017-10-05 ENCOUNTER — Encounter: Payer: Self-pay | Admitting: *Deleted

## 2017-10-08 ENCOUNTER — Telehealth: Payer: Self-pay

## 2017-10-08 NOTE — Telephone Encounter (Signed)
Pt has been informed she does not have celiacs disease. She said her PCP has started referral process for immunology.  Thanks [employer]

## 2017-10-08 NOTE — Telephone Encounter (Signed)
-----   Message from Lin Landsman, MD sent at 10/08/2017  1:12 PM EST ----- Earvin Hansen pt of the results. She does not have celiac disease. PCP can refer her to immunologist  -RV

## 2017-11-03 ENCOUNTER — Telehealth: Payer: Self-pay

## 2017-11-03 NOTE — Telephone Encounter (Signed)
Pt requesting MRI... Right shoulder pain.... Also--C6 Nerve Compression: She is waiting to have a discectomy, but has not had this scheduled yet. She has numbness and tingling in both of her arms. She follows with neurosurgeon but can not remember the name.   This is from the 05/2017 OV... Please advise

## 2017-11-03 NOTE — Telephone Encounter (Signed)
Copied from [place] [id number]. Topic: Referral - Request >> Nov 03, 2017  2:23 PM Bea Graff, NT wrote: Reason for CRM: Patient is requesting an referral for either a MRI to be done or a specialist for right shoulder pain/issues. She has [employer]. She had surgery earlier this year and has met her deductible and would like to try and get an appt for one or the other this year.

## 2017-11-04 NOTE — Telephone Encounter (Signed)
Left detailed msg on VM per HIPAA  

## 2017-11-04 NOTE — Telephone Encounter (Signed)
She either needs to make an appt with a neurosurgeon or myself to get this ordered.

## 2017-11-07 IMAGING — US US ABDOMEN LIMITED
1 series · 14 of 25 positions shown · non-contrast
Comparison: None.

CLINICAL DATA: RIGHT upper quadrant pain epigastric pain. Nausea
and vomiting for 1 week.

EXAM:
ULTRASOUND ABDOMEN LIMITED RIGHT UPPER QUADRANT

[Series 1: us abdomen limited · 0.21mm/px · 14 of 57 slices shown]
[im 1/57]
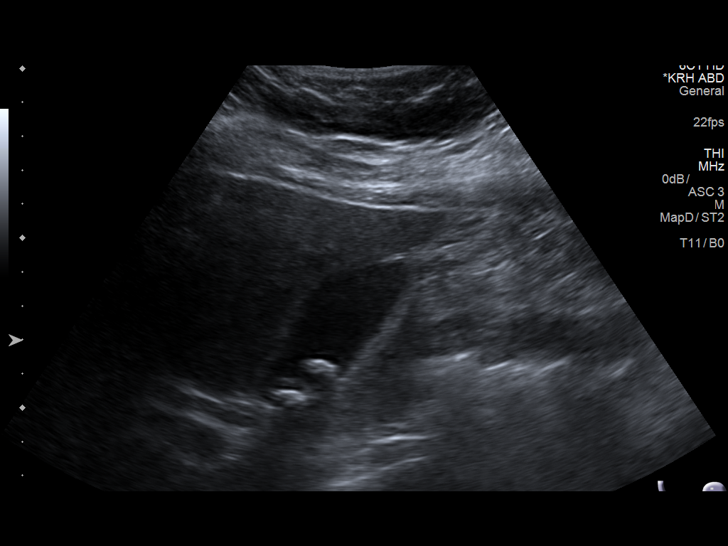
[im 5/57]
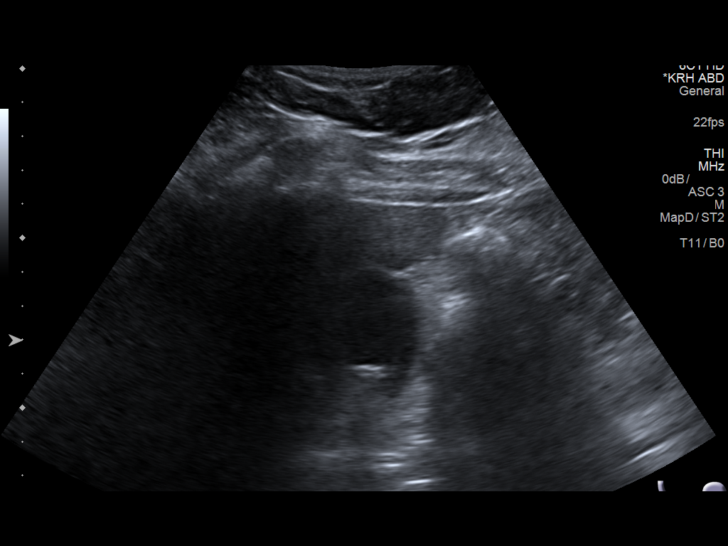
[im 10/57]
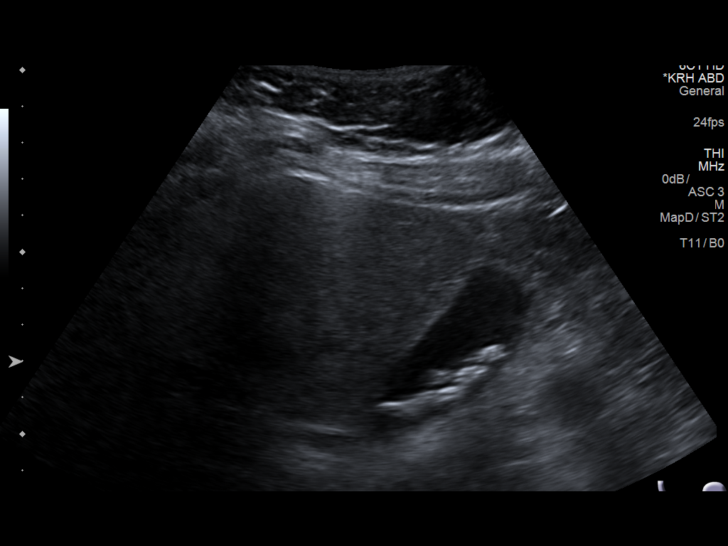
[im 15/57]
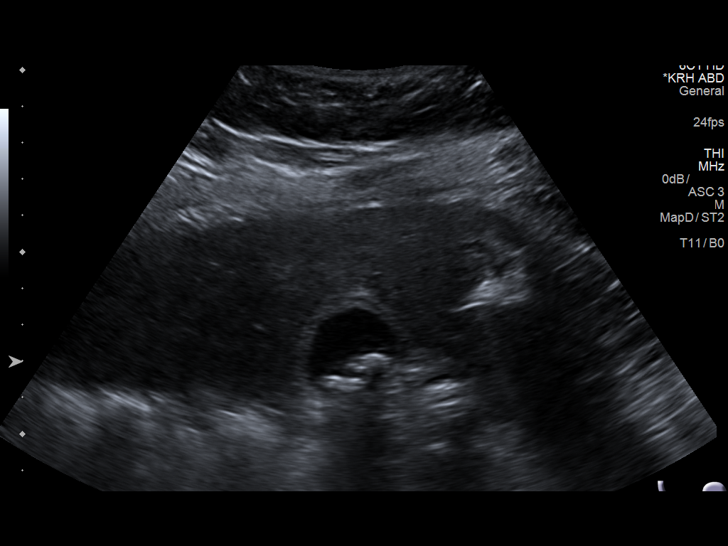
[im 19/57]
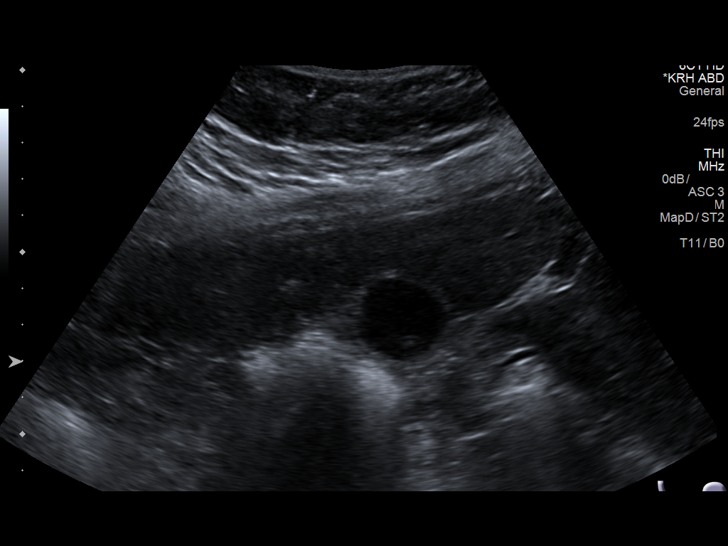
[im 22/57]
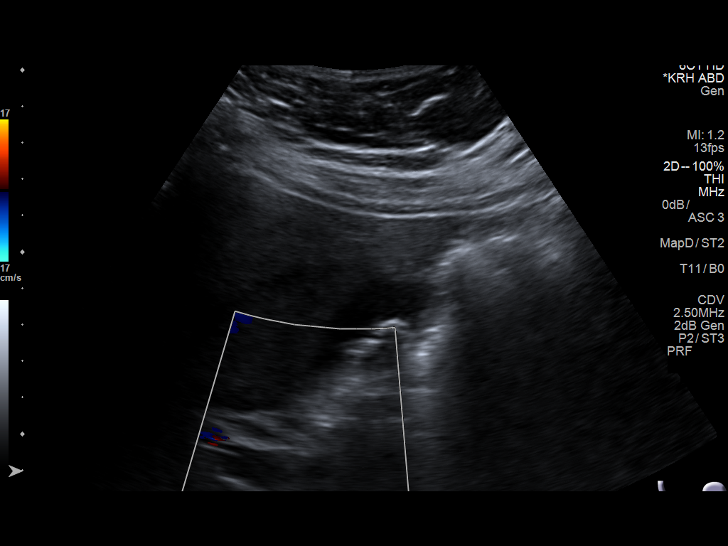
[im 26/57]
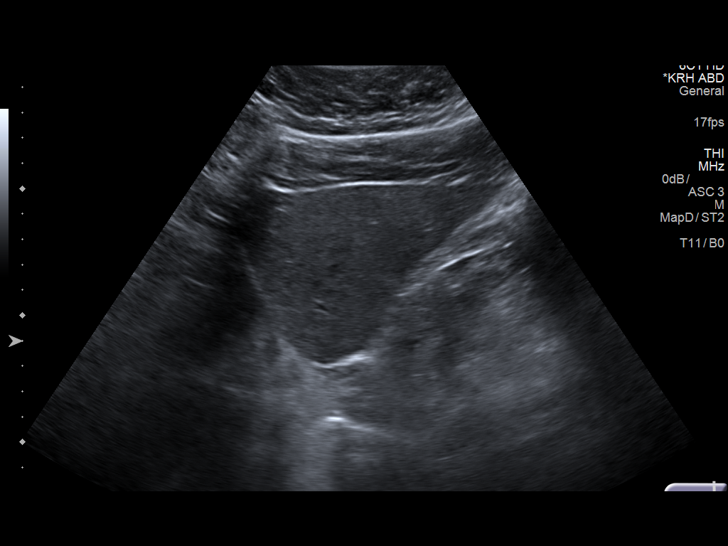
[im 31/57]
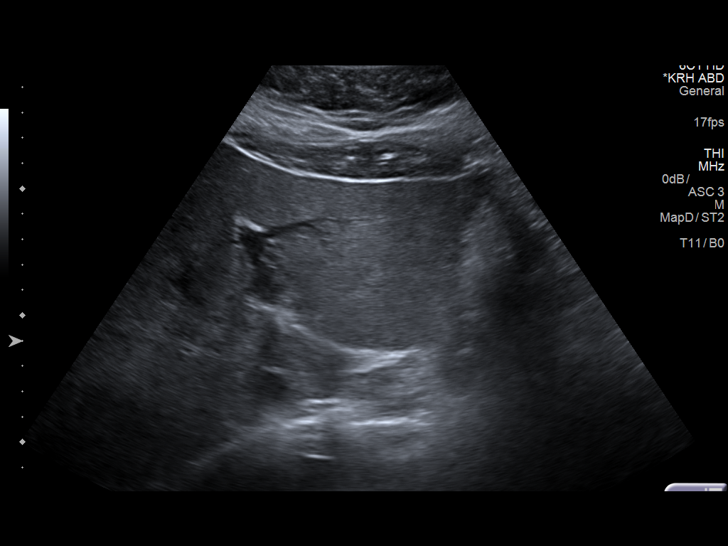
[im 36/57]
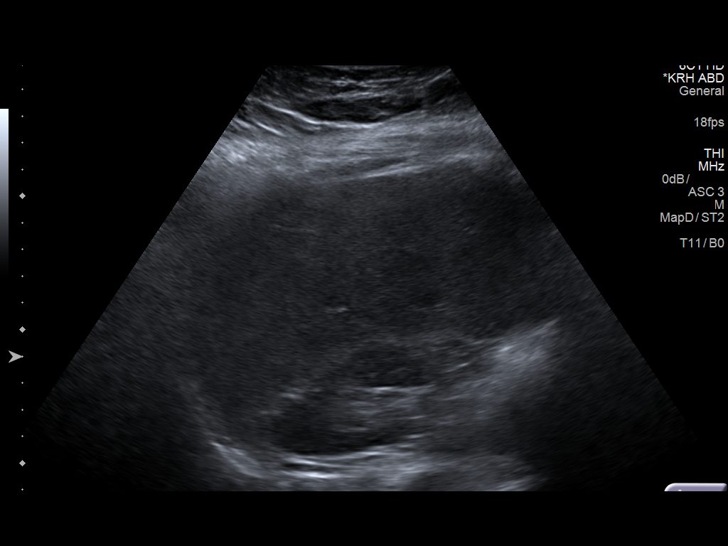
[im 38/57]
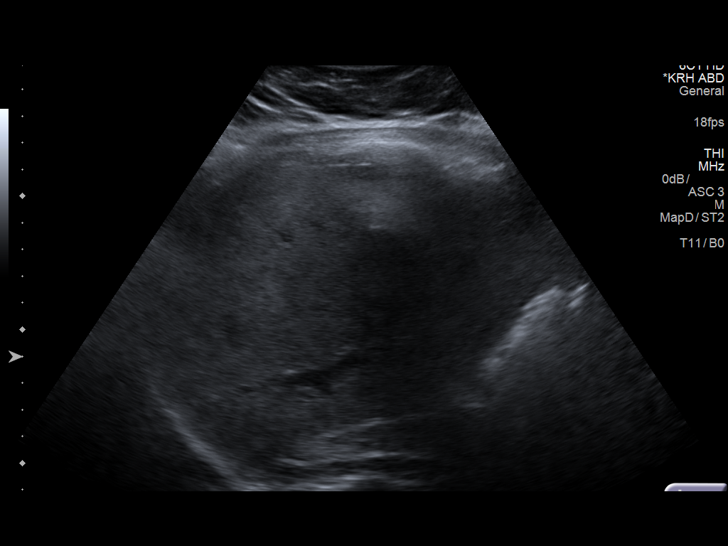
[im 43/57]
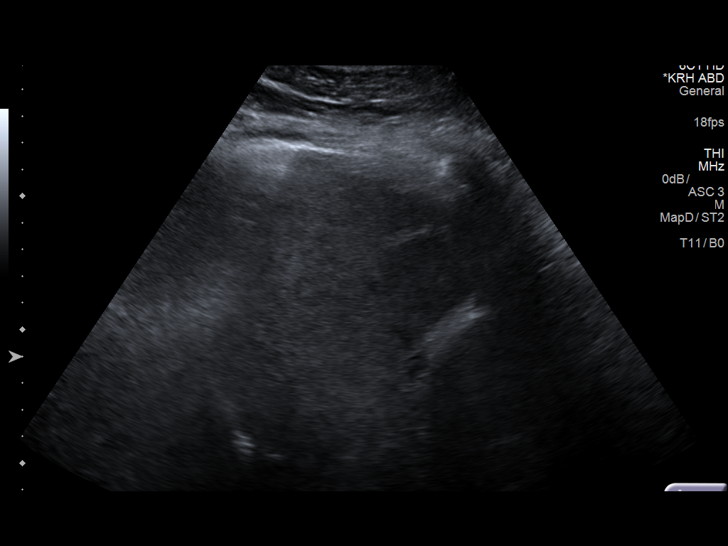
[im 47/57]
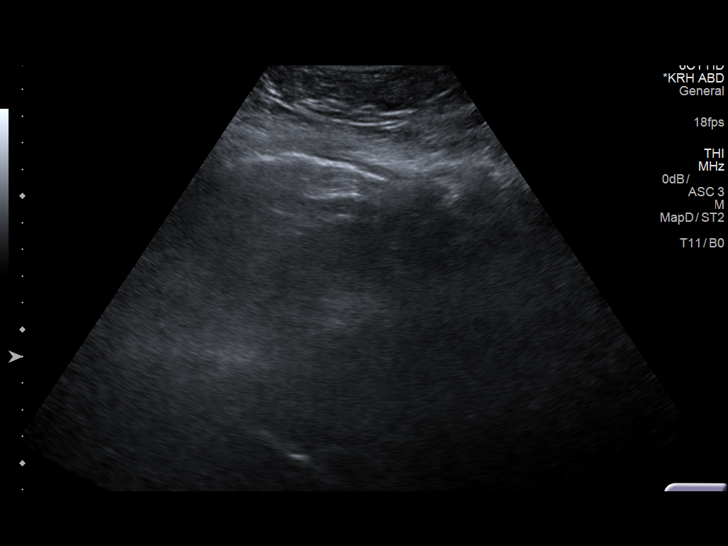
[im 52/57]
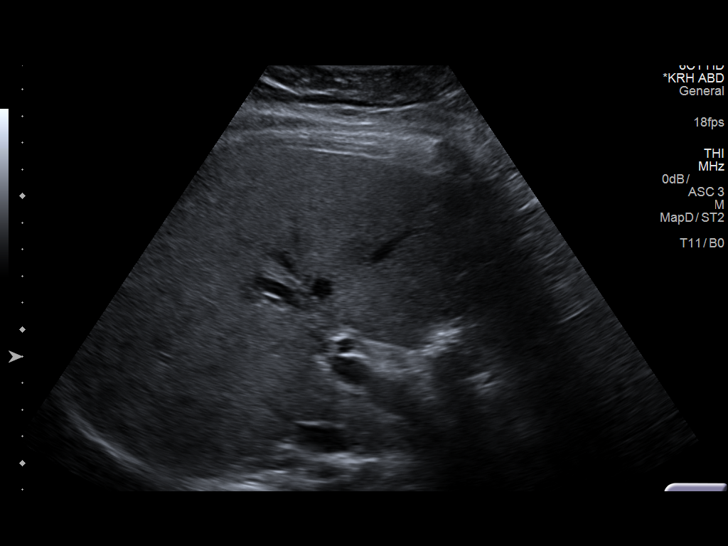
[im 57/57]
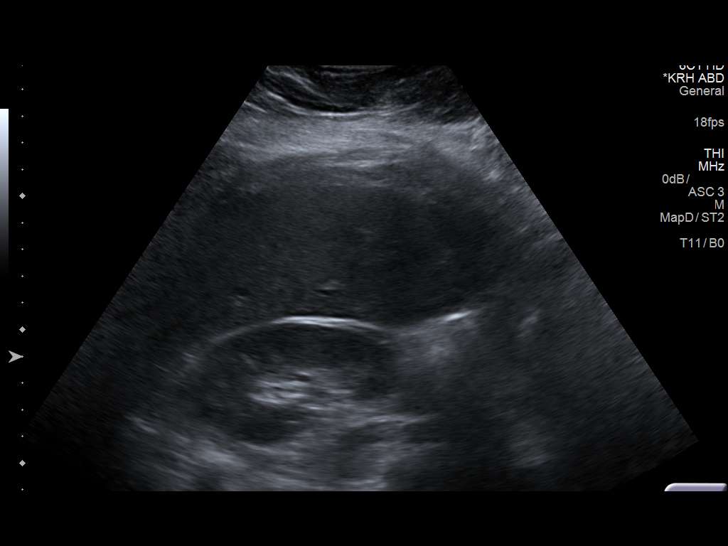

[14 of 25 positions shown; findings below may reference images not displayed]

FINDINGS: Gallbladder:

There are multiple gallstones measuring up to 1 cm. The gallbladder
is not distended however the gallbladder wall is thickened to 4 mm.
Negative sonographic Murphy's sign.

Common bile duct:

Diameter: Normal 4 mm

Liver:

No focal lesion identified. Within normal limits in parenchymal
echogenicity.
IMPRESSION: Multiple gallstones with gallbladder wall thickening. Findings could
indicate chronic cholecystitis. No gallbladder distension,
pericystic cholecystic fluid or positive Murphy sign to suggest
acute cholecystitis.

## 2017-11-09 ENCOUNTER — Ambulatory Visit: Payer: 59 | Admitting: Internal Medicine

## 2017-11-20 IMAGING — US US ABDOMEN LIMITED
1 series · 14 of 25 positions shown · non-contrast
Comparison: Ultrasound 2 days prior 05/23/2017

CLINICAL DATA: Persistent right upper quadrant pain.

EXAM:
ULTRASOUND ABDOMEN LIMITED RIGHT UPPER QUADRANT

[Series 1: us abdomen limited · 0.28mm/px · 14 of 53 slices shown]
[im 1/53]
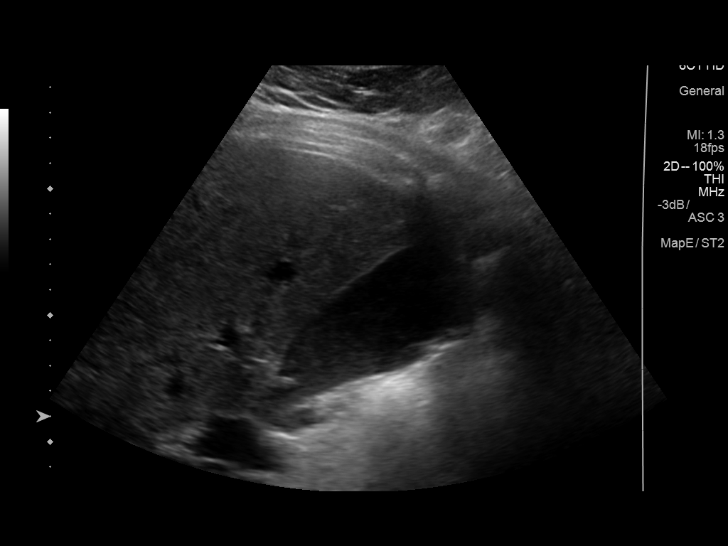
[im 5/53]
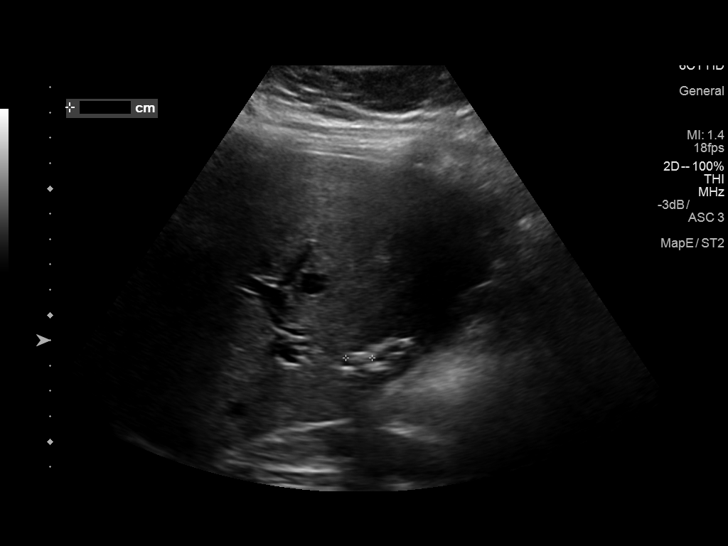
[im 9/53]
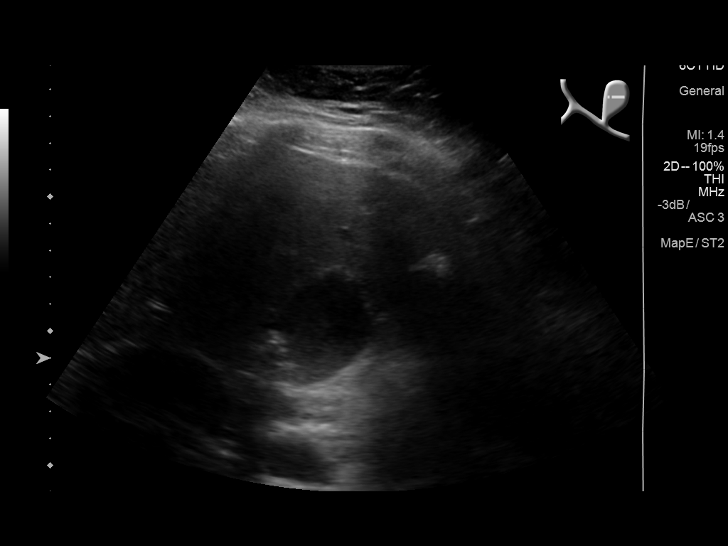
[im 14/53]
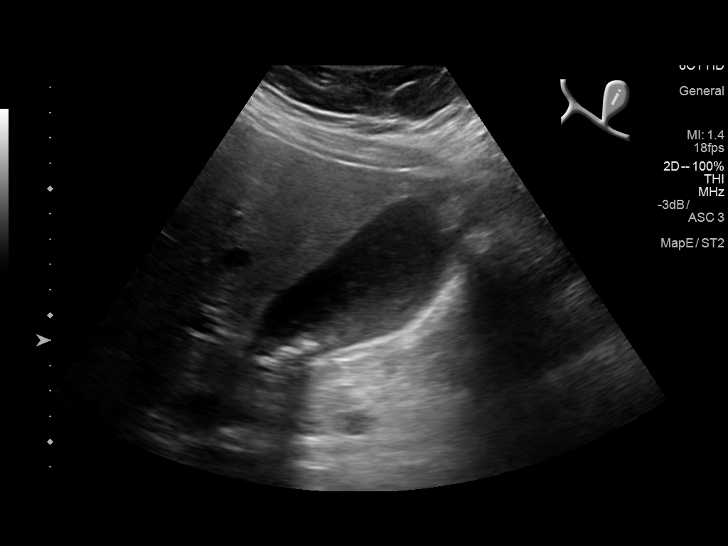
[im 18/53]
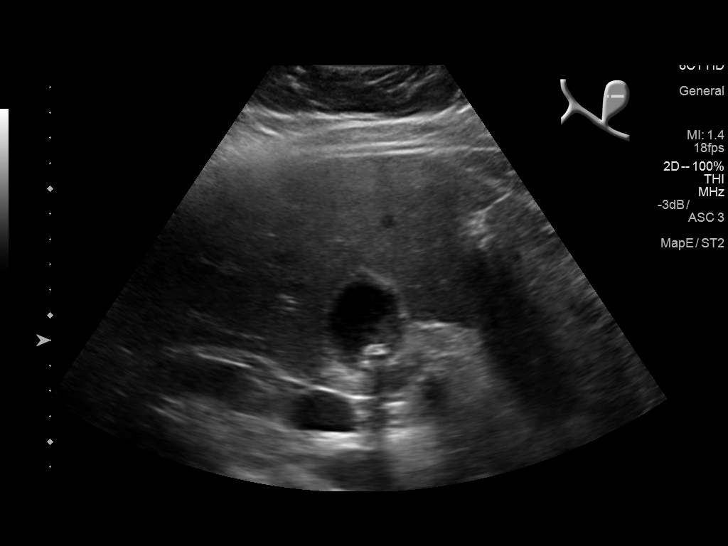
[im 20/53]
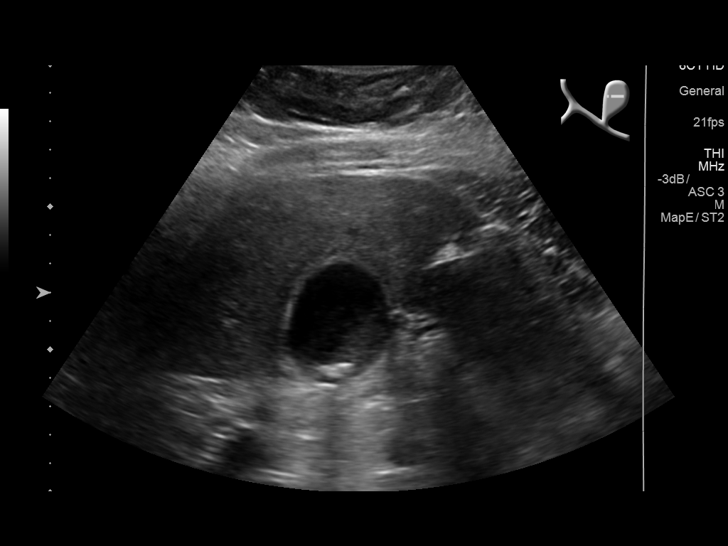
[im 24/53]
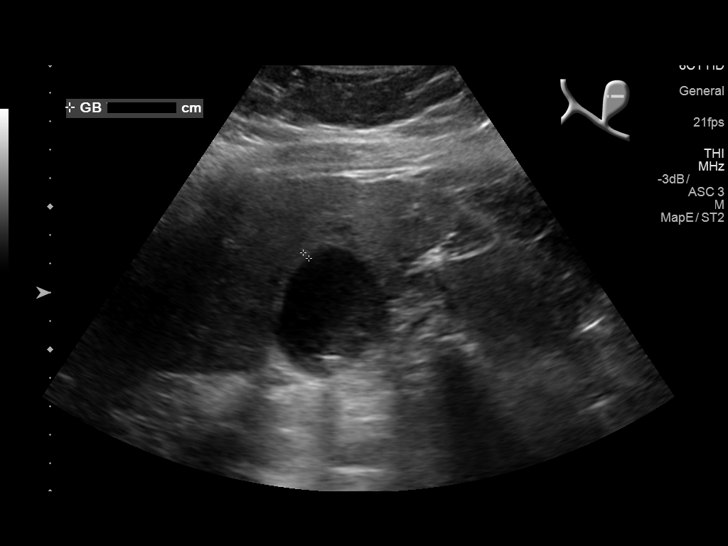
[im 29/53]
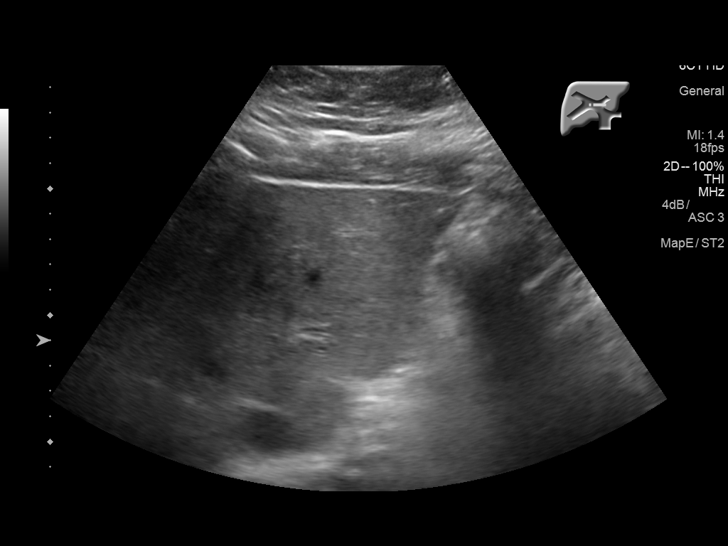
[im 33/53]
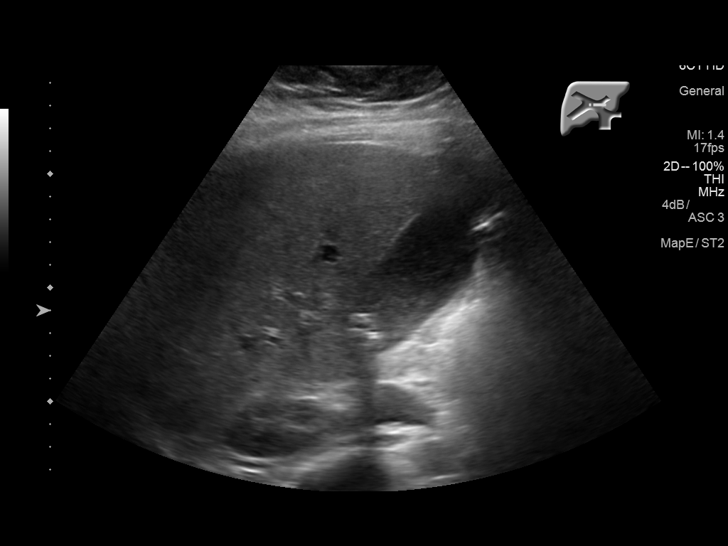
[im 35/53]
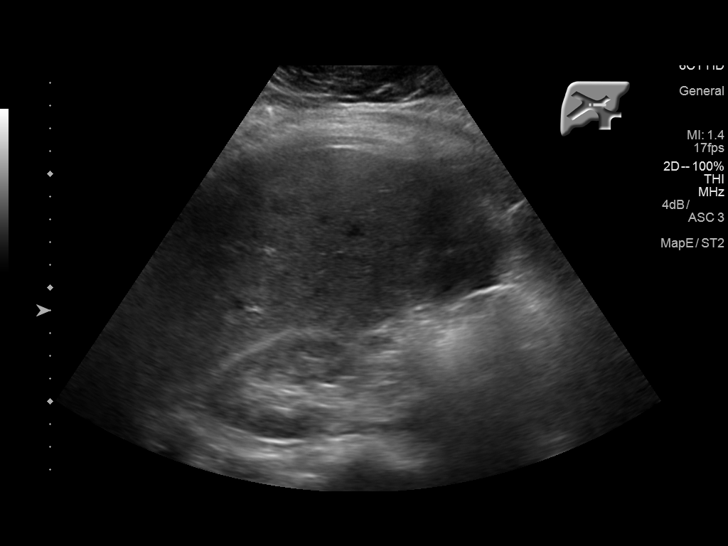
[im 40/53]
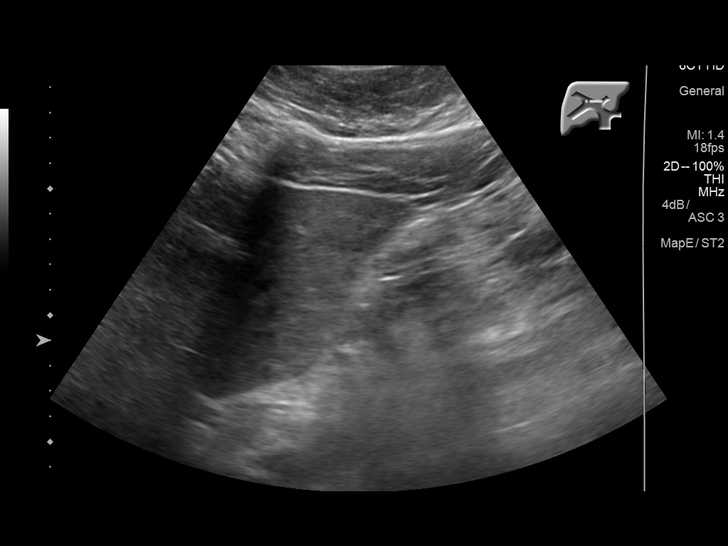
[im 44/53]
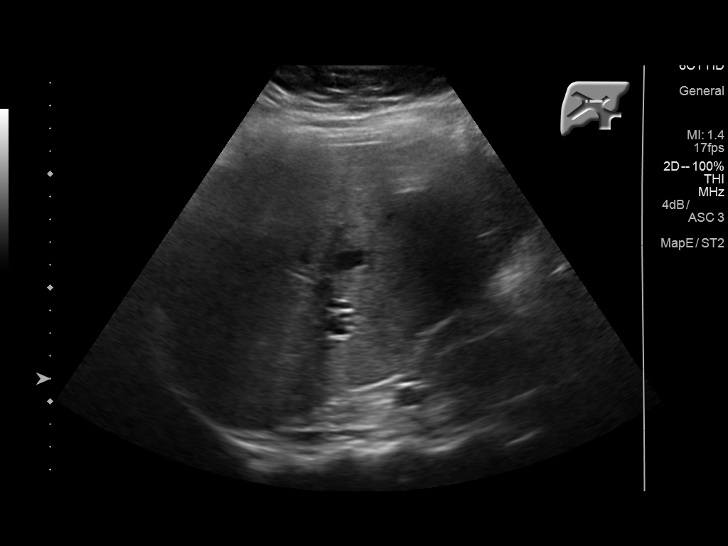
[im 48/53]
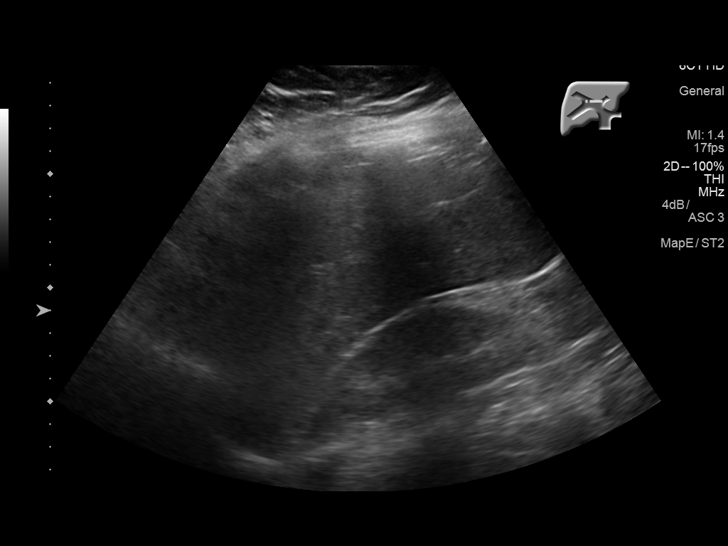
[im 53/53]
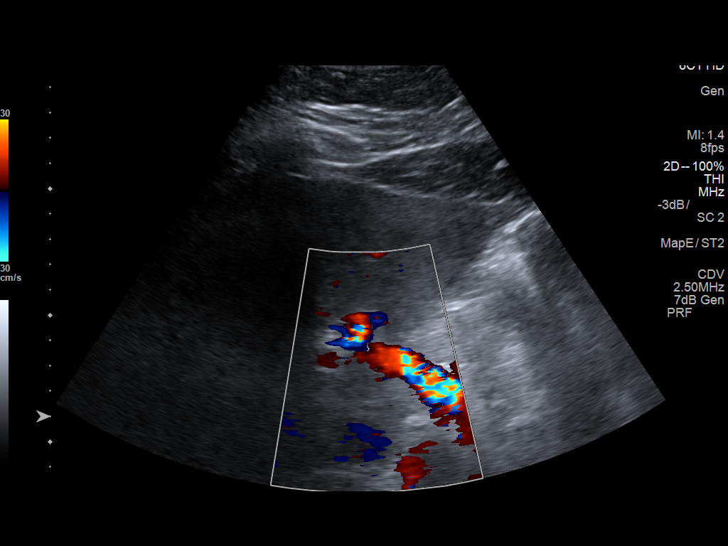

[14 of 25 positions shown; findings below may reference images not displayed]

FINDINGS: Gallbladder:

Physiologically distended containing sludge and multiple gallstones.
Decreased wall thickening from prior exam, currently 2.8 mm,
previously 3.9 mm. No pericholecystic fluid. No sonographic Murphy
sign noted by sonographer.

Common bile duct:

Diameter: 4-5 mm.

Liver:

No focal lesion identified. Within normal limits in parenchymal
echogenicity.
IMPRESSION: Cholelithiasis. Decreased gallbladder wall thickening from prior
exam. No sonographic findings of acute cholecystitis.

## 2018-09-15 ENCOUNTER — Ambulatory Visit: Payer: BC Managed Care – PPO | Admitting: Family Medicine

## 2018-09-15 ENCOUNTER — Encounter: Payer: Self-pay | Admitting: Family Medicine

## 2018-09-15 VITALS — BP 112/80 | HR 78 | Temp 98.6°F | Ht 66.0 in | Wt 307.5 lb

## 2018-09-15 DIAGNOSIS — M5412 Radiculopathy, cervical region: Secondary | ICD-10-CM

## 2018-09-15 DIAGNOSIS — G8929 Other chronic pain: Secondary | ICD-10-CM | POA: Insufficient documentation

## 2018-09-15 DIAGNOSIS — M5442 Lumbago with sciatica, left side: Secondary | ICD-10-CM | POA: Diagnosis not present

## 2018-09-15 DIAGNOSIS — M5441 Lumbago with sciatica, right side: Secondary | ICD-10-CM | POA: Diagnosis not present

## 2018-09-15 MED ORDER — GABAPENTIN 300 MG PO CAPS: 300.0000 mg | ORAL_CAPSULE | Freq: Every day | ORAL | 3 refills | Status: DC

## 2018-09-15 MED ORDER — METAXALONE 800 MG PO TABS: 800.0000 mg | ORAL_TABLET | Freq: Three times a day (TID) | ORAL | 0 refills | Status: DC

## 2018-09-15 NOTE — Assessment & Plan Note (Signed)
Restart gabapentin , skelaxin as needed , refer to PT and return to spine specialist if not improving as expected.  recommended weight loss and core strengthening over time.  Given temporary disability plaquard given works at [employer] and has to walk very long way... Make pain worse.

## 2018-09-15 NOTE — Progress Notes (Signed)
Subjective:    Patient ID: Maria Bright, female    DOB: 1976-07-14, 42 y.o.   MRN: [id number]  HPI    42 year old female  Pt of [facility] with history of chronic low back pain presents with new onset back pain.  In last 24 hours pain has increased in a flare above baseline constant pain. No current radiation of pain.  No weakness, no numbness.  No fever, no urinary incontinence, no pelvic numbness.  Pain is central low back , feels best in seated position.  Sits a lot at work.  No fall, no injury.  Left hip flexor seems to be locking up. Using ibuprofen 800mg  BID ( contributes to ibuprofen) Occ using hydrocodone she has leftover from past gallbladder surgery.  She is requesting muscle relaxant today... In past skelaxin helped in past. Has been seeing chiropractor.    Hx of back issues.. L5 herniation.. Followed by spine specialist recommended cervical  spine surgery.. Last MRI 1.5 year ago.  Using gabapentin for pain in past.  Blood pressure 112/80, pulse 78, temperature 98.6 F (37 C), temperature source Oral, height 5\' 6"  (1.676 m), weight (!) 307 lb 8 oz (139.5 kg). Social History /Family History/Past Medical History reviewed in detail and updated in EMR if needed.   Review of Systems  Constitutional: Negative for fatigue and fever.  HENT: Negative for congestion.   Eyes: Negative for pain.  Respiratory: Negative for cough and shortness of breath.   Cardiovascular: Negative for chest pain, palpitations and leg swelling.  Gastrointestinal: Negative for abdominal pain.  Genitourinary: Negative for dysuria and vaginal bleeding.  Musculoskeletal: Negative for back pain.  Neurological: Negative for syncope, light-headedness and headaches.  Psychiatric/Behavioral: Negative for dysphoric mood.       Objective:   Physical Exam  Constitutional: Vital signs are normal. She appears well-developed and well-nourished. She is cooperative.  Non-toxic appearance. She does not  appear ill. No distress.  obesity  HENT:  Head: Normocephalic.  Right Ear: Hearing, tympanic membrane, external ear and ear canal normal. Tympanic membrane is not erythematous, not retracted and not bulging.  Left Ear: Hearing, tympanic membrane, external ear and ear canal normal. Tympanic membrane is not erythematous, not retracted and not bulging.  Nose: No mucosal edema or rhinorrhea. Right sinus exhibits no maxillary sinus tenderness and no frontal sinus tenderness. Left sinus exhibits no maxillary sinus tenderness and no frontal sinus tenderness.  Mouth/Throat: Uvula is midline, oropharynx is clear and moist and mucous membranes are normal.  Eyes: Pupils are equal, round, and reactive to light. Conjunctivae, EOM and lids are normal. Lids are everted and swept, no foreign bodies found.  Neck: Trachea normal and normal range of motion. Neck supple. Carotid bruit is not present. No thyroid mass and no thyromegaly present.  Cardiovascular: Normal rate, regular rhythm, S1 normal, S2 normal, normal heart sounds, intact distal pulses and normal pulses. Exam reveals no gallop and no friction rub.  No murmur heard. Pulmonary/Chest: Effort normal and breath sounds normal. No tachypnea. No respiratory distress. She has no decreased breath sounds. She has no wheezes. She has no rhonchi. She has no rales.  Abdominal: Soft. Normal appearance and bowel sounds are normal. There is no tenderness.  Musculoskeletal:       Lumbar back: She exhibits decreased range of motion and tenderness. She exhibits no bony tenderness.  bilateral SLR positive, neg Faber's  Neurological: She is alert. She has normal strength. No sensory deficit.  Skin: Skin is warm,  dry and intact. No rash noted.  Psychiatric: Her speech is normal and behavior is normal. Judgment and thought content normal. Her mood appears not anxious. Cognition and memory are normal. She does not exhibit a depressed mood.          Assessment & Plan:

## 2018-10-07 ENCOUNTER — Other Ambulatory Visit: Payer: Self-pay | Admitting: Family Medicine

## 2018-10-07 NOTE — Telephone Encounter (Signed)
Ok to change to 90 day supply

## 2018-10-07 NOTE — Telephone Encounter (Signed)
Pharmacy is requesting 90 day supply.  Ok to send in?

## 2019-01-05 ENCOUNTER — Other Ambulatory Visit: Payer: Self-pay | Admitting: Family Medicine

## 2019-01-06 NOTE — Telephone Encounter (Signed)
Last office visit 09/15/2018 with Dr. Diona Browner for chronic low back pain with sciatica.  Last refilled 10/07/2018 for #90 with no refills.  No future appointments.  Ok to refill?

## 2019-02-08 ENCOUNTER — Other Ambulatory Visit: Payer: Self-pay | Admitting: Family Medicine

## 2019-02-12 ENCOUNTER — Other Ambulatory Visit: Payer: Self-pay | Admitting: Family Medicine

## 2019-03-02 ENCOUNTER — Other Ambulatory Visit: Payer: Self-pay | Admitting: Family Medicine

## 2019-03-03 NOTE — Telephone Encounter (Signed)
Need to schedule web visit

## 2019-03-09 ENCOUNTER — Encounter: Payer: Self-pay | Admitting: Internal Medicine

## 2019-03-09 ENCOUNTER — Ambulatory Visit (INDEPENDENT_AMBULATORY_CARE_PROVIDER_SITE_OTHER): Payer: BC Managed Care – PPO | Admitting: Internal Medicine

## 2019-03-09 DIAGNOSIS — K219 Gastro-esophageal reflux disease without esophagitis: Secondary | ICD-10-CM

## 2019-03-09 DIAGNOSIS — M5442 Lumbago with sciatica, left side: Secondary | ICD-10-CM

## 2019-03-09 DIAGNOSIS — E282 Polycystic ovarian syndrome: Secondary | ICD-10-CM | POA: Diagnosis not present

## 2019-03-09 DIAGNOSIS — R5383 Other fatigue: Secondary | ICD-10-CM | POA: Diagnosis not present

## 2019-03-09 DIAGNOSIS — M5441 Lumbago with sciatica, right side: Secondary | ICD-10-CM

## 2019-03-09 DIAGNOSIS — F901 Attention-deficit hyperactivity disorder, predominantly hyperactive type: Secondary | ICD-10-CM

## 2019-03-09 DIAGNOSIS — I10 Essential (primary) hypertension: Secondary | ICD-10-CM | POA: Diagnosis not present

## 2019-03-09 DIAGNOSIS — F419 Anxiety disorder, unspecified: Secondary | ICD-10-CM

## 2019-03-09 DIAGNOSIS — G8929 Other chronic pain: Secondary | ICD-10-CM

## 2019-03-09 DIAGNOSIS — F329 Major depressive disorder, single episode, unspecified: Secondary | ICD-10-CM

## 2019-03-09 DIAGNOSIS — F32A Depression, unspecified: Secondary | ICD-10-CM

## 2019-03-09 MED ORDER — BUPROPION HCL ER (XL) 150 MG PO TB24: 150.0000 mg | ORAL_TABLET | Freq: Every day | ORAL | 1 refills | Status: DC

## 2019-03-09 MED ORDER — GABAPENTIN 300 MG PO CAPS: 300.0000 mg | ORAL_CAPSULE | Freq: Every day | ORAL | 1 refills | Status: DC

## 2019-03-09 MED ORDER — NORETHIN ACE-ETH ESTRAD-FE 1-20 MG-MCG PO TABS: 1.0000 | ORAL_TABLET | Freq: Every day | ORAL | 1 refills | Status: DC

## 2019-03-09 NOTE — Progress Notes (Signed)
Virtual Visit via Video Note  I connected with Maria Bright on 03/09/19 at  3:00 PM EDT by a video enabled telemedicine application and verified that I am speaking with the correct person using two identifiers.   I discussed the limitations of evaluation and management by telemedicine and the availability of in person appointments. The patient expressed understanding and agreed to proceed.  History of Present Illness:  Pt due for follow up of chronic conditions.  OSA: She averages 6.5 hours of sleep per night with the use of her CPAP.  She feels rested when she wakes up. There is no sleep study on file.  PCOS: Managed with OCP's. She follows with GYN.  HTN: Her systolic's range 169-678.  She takes Propanolol as needed about 2-3 times per week (more for social anxiety). ECG from 05/2017 reviewed.  GERD: She denies breakthrough on Omeprazole. There is no upper GI on file.  Anxiety and Depression: Chronic but stable on Pristiq, but not the Wellbutrin. She is currently seeing a therapist but does see a psychiatrist. She denies SI/HI.  ADHD: She feels like her dose of Adzenys is working well for her. She follows with psychiatry.  Cervical Radiculitis/Chroni: MRI of the cervical spine from 09/2016 reviewed. She is taking Neurontin as prescribed with good relief.  Observations/Objective:  Alert and oriented x 3 NAD Well kempt Behavior, judgement and thought content are normal  Assessment and Plan:  See problem based charting  Fatigue:  Will check TSH, Free T4, B12 and Vit D  Follow Up Instructions:    I discussed the assessment and treatment plan with the patient. The patient was provided an opportunity to ask questions and all were answered. The patient agreed with the plan and demonstrated an understanding of the instructions.   The patient was advised to call back or seek an in-person evaluation if the symptoms worsen or if the condition fails to improve as  anticipated.    [name], NP

## 2019-03-09 NOTE — Patient Instructions (Signed)

## 2019-03-09 NOTE — Assessment & Plan Note (Signed)
Stable on OCP's Will continue to follow with GYN

## 2019-03-09 NOTE — Assessment & Plan Note (Signed)
Controlled on Neurontin Will monitor

## 2019-03-09 NOTE — Assessment & Plan Note (Addendum)
Support offered today Continue Pristiq Will restart Wellbutrin Continue to follow with psych  Update me in 2-3 months and let me know how the anxiety and depression

## 2019-03-09 NOTE — Assessment & Plan Note (Signed)
Will have her set up nurse visit for BP check, lab visit for CMET Reinforced DASH diet and exercise for weight loss Continue Propanolol for now

## 2019-03-09 NOTE — Assessment & Plan Note (Signed)
Continue Maria Bright She will continue to follow with psychiatry

## 2019-03-09 NOTE — Assessment & Plan Note (Signed)
Will have her set up lab visit for CBC and CMET Continue Omeprazole

## 2019-03-14 ENCOUNTER — Telehealth: Payer: Self-pay

## 2019-03-14 NOTE — Telephone Encounter (Signed)
Left detailed VM w COVID screen and curbside info   

## 2019-03-15 ENCOUNTER — Other Ambulatory Visit: Payer: Self-pay

## 2019-03-15 ENCOUNTER — Other Ambulatory Visit (INDEPENDENT_AMBULATORY_CARE_PROVIDER_SITE_OTHER): Payer: BC Managed Care – PPO

## 2019-03-15 DIAGNOSIS — R5383 Other fatigue: Secondary | ICD-10-CM | POA: Diagnosis not present

## 2019-03-15 DIAGNOSIS — M5442 Lumbago with sciatica, left side: Secondary | ICD-10-CM

## 2019-03-15 DIAGNOSIS — F32A Depression, unspecified: Secondary | ICD-10-CM

## 2019-03-15 DIAGNOSIS — M5441 Lumbago with sciatica, right side: Secondary | ICD-10-CM

## 2019-03-15 DIAGNOSIS — I10 Essential (primary) hypertension: Secondary | ICD-10-CM

## 2019-03-15 DIAGNOSIS — K219 Gastro-esophageal reflux disease without esophagitis: Secondary | ICD-10-CM

## 2019-03-15 DIAGNOSIS — G8929 Other chronic pain: Secondary | ICD-10-CM

## 2019-03-15 DIAGNOSIS — F329 Major depressive disorder, single episode, unspecified: Secondary | ICD-10-CM

## 2019-03-15 DIAGNOSIS — F419 Anxiety disorder, unspecified: Secondary | ICD-10-CM

## 2019-03-15 DIAGNOSIS — E282 Polycystic ovarian syndrome: Secondary | ICD-10-CM | POA: Diagnosis not present

## 2019-03-15 DIAGNOSIS — F901 Attention-deficit hyperactivity disorder, predominantly hyperactive type: Secondary | ICD-10-CM

## 2019-03-15 LAB — COMPREHENSIVE METABOLIC PANEL WITH GFR
ALT: 28 U/L (ref 0–35)
AST: 21 U/L (ref 0–37)
Albumin: 4 g/dL (ref 3.5–5.2)
Alkaline Phosphatase: 96 U/L (ref 39–117)
BUN: 14 mg/dL (ref 6–23)
CO2: 27 meq/L (ref 19–32)
Calcium: 9.1 mg/dL (ref 8.4–10.5)
Chloride: 101 meq/L (ref 96–112)
Creatinine, Ser: 0.89 mg/dL (ref 0.40–1.20)
GFR: 70.1 mL/min
Glucose, Bld: 98 mg/dL (ref 70–99)
Potassium: 3.7 meq/L (ref 3.5–5.1)
Sodium: 137 meq/L (ref 135–145)
Total Bilirubin: 0.4 mg/dL (ref 0.2–1.2)
Total Protein: 7.2 g/dL (ref 6.0–8.3)

## 2019-03-15 LAB — CBC
HCT: 42.4 % (ref 36.0–46.0)
Hemoglobin: 14.5 g/dL (ref 12.0–15.0)
MCHC: 34.3 g/dL (ref 30.0–36.0)
MCV: 87.5 fl (ref 78.0–100.0)
Platelets: 268 K/uL (ref 150.0–400.0)
RBC: 4.84 Mil/uL (ref 3.87–5.11)
RDW: 14 % (ref 11.5–15.5)
WBC: 7.8 K/uL (ref 4.0–10.5)

## 2019-03-15 LAB — VITAMIN D 25 HYDROXY (VIT D DEFICIENCY, FRACTURES): VITD: 33.41 ng/mL (ref 30.00–100.00)

## 2019-03-15 LAB — VITAMIN B12: Vitamin B-12: 340 pg/mL (ref 211–911)

## 2019-03-15 LAB — TSH: TSH: 4.17 u[IU]/mL (ref 0.35–4.50)

## 2019-03-15 LAB — T4, FREE: Free T4: 0.68 ng/dL (ref 0.60–1.60)

## 2019-08-18 LAB — HM PAP SMEAR: HM Pap smear: NORMAL

## 2019-08-22 ENCOUNTER — Telehealth: Payer: Self-pay

## 2019-08-22 NOTE — Telephone Encounter (Signed)
Pt will have to have appt, virtual okay as [name] has never evaluated her for this or filled this medication

## 2019-08-22 NOTE — Telephone Encounter (Signed)
Pt left v/m; pt requesting rx for an inhaler with spacer; pt said R Baity NP has not prescribed an inhaler before; no inhaler on current or hx med list. Pt has exertional related asthma. Pt also sees an allergist. Unable to reach pt by phone and left v/m that pt needs to cb [facility] and schedule an appt.

## 2019-09-04 ENCOUNTER — Other Ambulatory Visit: Payer: Self-pay | Admitting: Internal Medicine

## 2019-09-21 ENCOUNTER — Other Ambulatory Visit: Payer: Self-pay | Admitting: Internal Medicine

## 2019-10-06 ENCOUNTER — Other Ambulatory Visit: Payer: Self-pay | Admitting: Internal Medicine

## 2019-10-12 ENCOUNTER — Other Ambulatory Visit: Payer: Self-pay

## 2019-10-12 ENCOUNTER — Encounter: Payer: Self-pay | Admitting: Internal Medicine

## 2019-10-12 ENCOUNTER — Ambulatory Visit: Payer: BC Managed Care – PPO | Admitting: Internal Medicine

## 2019-10-12 VITALS — BP 124/82 | HR 83 | Temp 98.3°F | Wt 311.0 lb

## 2019-10-12 DIAGNOSIS — M5441 Lumbago with sciatica, right side: Secondary | ICD-10-CM

## 2019-10-12 DIAGNOSIS — K5909 Other constipation: Secondary | ICD-10-CM

## 2019-10-12 DIAGNOSIS — G8929 Other chronic pain: Secondary | ICD-10-CM

## 2019-10-12 DIAGNOSIS — R195 Other fecal abnormalities: Secondary | ICD-10-CM | POA: Diagnosis not present

## 2019-10-12 DIAGNOSIS — Z23 Encounter for immunization: Secondary | ICD-10-CM

## 2019-10-12 DIAGNOSIS — R140 Abdominal distension (gaseous): Secondary | ICD-10-CM

## 2019-10-12 DIAGNOSIS — F419 Anxiety disorder, unspecified: Secondary | ICD-10-CM

## 2019-10-12 DIAGNOSIS — F901 Attention-deficit hyperactivity disorder, predominantly hyperactive type: Secondary | ICD-10-CM

## 2019-10-12 DIAGNOSIS — Z91018 Allergy to other foods: Secondary | ICD-10-CM | POA: Diagnosis not present

## 2019-10-12 DIAGNOSIS — M5442 Lumbago with sciatica, left side: Secondary | ICD-10-CM

## 2019-10-12 DIAGNOSIS — Z9049 Acquired absence of other specified parts of digestive tract: Secondary | ICD-10-CM

## 2019-10-12 MED ORDER — LISDEXAMFETAMINE DIMESYLATE 20 MG PO CAPS: 20.0000 mg | ORAL_CAPSULE | Freq: Two times a day (BID) | ORAL | 0 refills | Status: DC

## 2019-10-12 NOTE — Progress Notes (Signed)
Subjective:    Patient ID: Maria Bright, female    DOB: 03-Aug-1976, 43 y.o.   MRN: [id number]  HPI  Pt presents to the clinic today with c/o GI issues and food allergies.   Diarrhea/constipation: She mentions she has intermittent diarrhea for about 2-3 times a day that alternates with constipation. She reports more diarrhea in the morning with abdominal bloating and gas. There is no change in her diet and she eats a lot of fiber and vegetables. She denies pain or blood in the stool. She has history of cholecystectomy in 2018 and has not followed up with GI since then. She has not tried anything for this because afraid it will make things worse.   Food allergy: She is currently seeing an allergist in [place] Dr Remus Blake at [facility]. 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 She has plant allergies: tomatoe, pepper and lactose intolerant. She feels that her food allergy had been mostly controlled with diet and avoidance of trigger foods. She feels that it has been getting preggresively worse.  Currently getting weekly allergy injections and feels that she is almost at maintenance.   Chronic back pain: History of herniated disc at L5. This has been an obstacle for weight loss and exercise. She reports getting injured at the age of 85 and has poor joint stability. No recent imaging available.  Anxiety: Mentions she is on three different medications and they have not been able to help control her anxiety. She reports a family history of PTSD. She is currently seeing a counselor every 2 weeks and health coach every 2 weeks which has been helpful. She denies SI/HI.   Review of Systems      Past Medical History:  Diagnosis Date  . ADHD (attention deficit hyperactivity disorder)   . Anxiety   . Complication of anesthesia    30 years ago ; woke up in middle of wisdom tooth extraction   . Depression   . GERD (gastroesophageal reflux disease)   . Hypertension   .  Polycystic disease, ovaries    POC   . Seasonal allergies   . Sleep apnea    CPAP     Current Outpatient Medications  Medication Sig Dispense Refill  . Amphetamine ER (ADZENYS XR-ODT) 12.5 MG TBED Take 1 tablet by mouth 2 (two) times daily.    Marland Kitchen buPROPion (WELLBUTRIN XL) 150 MG 24 hr tablet Take 1 tablet (150 mg total) by mouth daily. 90 tablet 0  . cetirizine (ZYRTEC) 10 MG tablet Take 10 mg by mouth at bedtime.     Marland Kitchen desvenlafaxine (PRISTIQ) 100 MG 24 hr tablet Take 100 mg by mouth every evening.     . diphenhydrAMINE (BENADRYL) 25 MG tablet Take 50 mg by mouth every 6 (six) hours as needed for itching or allergies.    Marland Kitchen gabapentin (NEURONTIN) 300 MG capsule TAKE 1 CAPSULE (300 MG TOTAL) BY MOUTH AT BEDTIME. MUST SCHEDULE PHYSICAL 90 capsule 0  . norethindrone-ethinyl estradiol (JUNEL FE,GILDESS FE,LOESTRIN FE) 1-20 MG-MCG tablet Take 1 tablet by mouth daily. 3 Package 1  . omeprazole (PRILOSEC OTC) 20 MG tablet Take 20 mg by mouth daily.    . propranolol (INDERAL) 10 MG tablet Take 10 mg by mouth daily as needed (anxiety). Anxiety     No current facility-administered medications for this visit.     Allergies  Allergen Reactions  . Morphine And Related Other (See Comments)    Unknown   . Adhesive [  Tape] Rash    blisters  . Nickel Rash    Family History  Problem Relation Age of Onset  . Arthritis Mother   . Hyperlipidemia Mother   . Heart disease Mother   . Hypertension Mother   . Rheum arthritis Maternal Aunt   . Hyperlipidemia Paternal Aunt   . Diabetes Paternal Aunt   . Rheum arthritis Maternal Grandmother   . Heart disease Maternal Grandmother   . Arthritis Maternal Grandfather   . Colon cancer Maternal Grandfather   . Hyperlipidemia Maternal Grandfather   . Heart disease Maternal Grandfather   . Hypertension Maternal Grandfather     Social History   Socioeconomic History  . Marital status: Married    Spouse name: Not on file  . Number of children: Not on  file  . Years of education: Not on file  . Highest education level: Not on file  Occupational History  . Not on file  Social Needs  . Financial resource strain: Not on file  . Food insecurity    Worry: Not on file    Inability: Not on file  . Transportation needs    Medical: Not on file    Non-medical: Not on file  Tobacco Use  . Smoking status: Never Smoker  . Smokeless tobacco: Never Used  Substance and Sexual Activity  . Alcohol use: No    Comment: seldom   . Drug use: No  . Sexual activity: Yes    Birth control/protection: Pill  Lifestyle  . Physical activity    Days per week: Not on file    Minutes per session: Not on file  . Stress: Not on file  Relationships  . Social Herbalist on phone: Not on file    Gets together: Not on file    Attends religious service: Not on file    Active member of club or organization: Not on file    Attends meetings of clubs or organizations: Not on file    Relationship status: Not on file  . Intimate partner violence    Fear of current or ex partner: Not on file    Emotionally abused: Not on file    Physically abused: Not on file    Forced sexual activity: Not on file  Other Topics Concern  . Not on file  Social History Narrative  . Not on file     Constitutional: Denies fever, malaise, fatigue, headache or abrupt weight changes.  Respiratory: Denies difficulty breathing, shortness of breath, cough or sputum production.   Cardiovascular: Denies chest pain, chest tightness, palpitations or swelling in the hands or feet.  Gastrointestinal: Reports diarrhea/constipation, bloating and gas. Reports food allergy. Denies abdominal pain or blood in the stool.  Musculoskeletal: Reports chronic back pain and history of L5 herniated disc.  Skin: Denies redness, rashes, lesions or ulcercations.   No other specific complaints in a complete review of systems (except as listed in HPI above).  Objective:   Physical Exam  BP  124/82   Pulse 83   Temp 98.3 F (36.8 C) (Temporal)   Wt (!) 311 lb (141.1 kg)   SpO2 98%   BMI 50.20 kg/m   Wt Readings from Last 3 Encounters:  09/15/18 (!) 307 lb 8 oz (139.5 kg)  10/01/17 290 lb (131.5 kg)  09/06/17 288 lb 6.4 oz (130.8 kg)    General: Appears her stated age, obese, NAD Skin: Warm, dry and intact.  Cardiovascular: Normal rate and rhythm. S1,S2  noted.  No murmur, rubs or gallops noted. Pulmonary/Chest: Normal effort and positive vesicular breath sounds. No respiratory distress. No wheezes, rales or ronchi noted.  Abdomen: Soft and nontender. Normal bowel sounds. No distention or masses noted. Liver, spleen and kidneys non palpable. Musculoskeletal:  Midline tenderness to cervical and lumbar region. Strength 5/5 BUE/BLE. No difficulty with gait. Psych: Teary on examination, appears slightly anxious    BMET    Component Value Date/Time   NA 137 03/15/2019 0820   K 3.7 03/15/2019 0820   CL 101 03/15/2019 0820   CO2 27 03/15/2019 0820   GLUCOSE 98 03/15/2019 0820   BUN 14 03/15/2019 0820   CREATININE 0.89 03/15/2019 0820   CALCIUM 9.1 03/15/2019 0820   GFRNONAA >60 06/21/2017 1553   GFRAA >60 06/21/2017 1553    Lipid Panel     Component Value Date/Time   CHOL 178 10/01/2017 1458   TRIG 128.0 10/01/2017 1458   HDL 57.70 10/01/2017 1458   CHOLHDL 3 10/01/2017 1458   VLDL 25.6 10/01/2017 1458   LDLCALC 95 10/01/2017 1458    CBC    Component Value Date/Time   WBC 7.8 03/15/2019 0820   RBC 4.84 03/15/2019 0820   HGB 14.5 03/15/2019 0820   HCT 42.4 03/15/2019 0820   PLT 268.0 03/15/2019 0820   MCV 87.5 03/15/2019 0820   MCH 29.5 06/04/2017 2253   MCHC 34.3 03/15/2019 0820   RDW 14.0 03/15/2019 0820   LYMPHSABS 2.7 06/04/2017 2253   MONOABS 0.9 06/04/2017 2253   EOSABS 0.4 06/04/2017 2253   BASOSABS 0.0 06/04/2017 2253    Hgb A1C Lab Results  Component Value Date   HGBA1C 5.4 10/01/2017            Assessment & Plan:   Diahrrea/Constipation:  Could be issues s/p cholecystectomy vs IBS Encouraged FODMAP diet Advised her to take Imodium as needed for diarrhea or Miralax as needed for constipation Referral to GI placed  Food Allergies:  Referral to GI placed per pt request Currently getting allergy shots from an allergist  Anxiety:  Support offered today Continue to see your therapist Increase Wellbutrin to 300 mg daily Continue Paxil  Chronic Low Back Pain:  Declines filling out handicap placard at this time She will get me copies of her most recent imaging Encouraged exercise for weight loss Encouraged regular stretching and core strengthening  Return precautions discussed [name], NP

## 2019-10-12 NOTE — Patient Instructions (Signed)
Food Allergy  A food allergy is when your body reacts to a food in a way that is not normal. The reaction can be mild or very bad. A very bad allergic reaction is called an anaphylactic reaction (anaphylaxis). A very bad reaction is an emergency. What are the causes? Common foods that can cause a reaction are:  Milk.  Eggs.  Peanuts.  Tree nuts. These include pecans, walnuts, and cashews.  Seafood.  Wheat.  Soy. What are the signs or symptoms? Signs of a mild reaction  Stuffy nose.  Tingling in the mouth.  An itchy, red rash.  Throwing up (vomiting).  Watery poop (diarrhea). Signs of a very bad reaction  Itchy, red, swollen areas of skin (hives).  Swelling of your: ? Eyes. ? Lips. ? Face. ? Mouth. ? Tongue. ? Throat.  Trouble with: ? Breathing. ? Talking. ? Swallowing.  Noisy breathing (wheezing).  Passing out (fainting).  Having any of these feelings: ? Warmth in your face (flushed). ? Dizziness. ? Light-headedness.  Pain in your belly. Follow these instructions at home: If you are being tested for an allergy:  Avoid foods as told by your doctor (elimination diet).  Write down what you eat and drink in a notebook (food diary). Each day, write: ? What you eat and drink and when. ? What problems you have and when. If you have a very bad allergy:   Wear a bracelet or necklace that says you have an allergy.  Carry your allergy kit (anaphylaxis kit) or an allergy shot (epinephrine injection) with you all the time. Use them as told by your doctor.  Make sure that you, your family, and your boss know: ? The signs of a very bad reaction. ? How to use your allergy kit. ? How to give an allergy shot.  If you use an allergy shot: ? Get more right away in case you have another reaction. ? Get help. You can have a life-threatening reaction after taking the medicine (rebound anaphylaxis). General instructions  Avoid the foods that you are allergic  to.  Read food labels. Look for ingredients that you are allergic to.  When you are at a restaurant, tell your server that you have an allergy. Ask if your meal has an ingredient that you are allergic to.  Take medicines only as told by your doctor. Do not drive until the medicine has worn off, unless your doctor says it is okay.  Tell all people who care for you that you have a food allergy. This includes your doctor and dentist.  If you think that you might be allergic to something else, talk with your doctor. Do not eat a food to see if you are allergic to it without talking with your doctor first. Contact a doctor if you:  Have signs of a reaction that have not gone away after 2 days.  Get worse.  Have new signs of a reaction. Get help right away if you have signs of a very bad reaction:  Itchy, red, swollen areas of skin.  Swelling of your: ? Eyes. ? Lips. ? Face. ? Mouth. ? Tongue. ? Throat.  Trouble with: ? Breathing. ? Talking. ? Swallowing.  Noisy breathing (wheezing).  Passing out (fainting).  Having any of these feelings: ? Warmth in your face (flushed). ? Dizziness. ? Light-headedness. ? Pain in your belly. These signs may be an emergency. Do not wait to see if the signs will go away. Use your allergy shot  or allergy kit as you have been told. Get medical help right away. Call your local emergency services (911 in the [place] Do not drive yourself to the hospital. If you had to use your allergy pen, you must go to the emergency room even if the medicine seems to be working. This is important because another allergic reaction may happen within 3 days. Summary  A food allergy is when your body reacts to a food in a way that is not normal.  Avoid the foods that you are allergic to.  Wear a bracelet or necklace that says you have an allergy.  Carry your allergy kit (anaphylaxis kit) or an allergy shot (epinephrine injection) with you all the time. Use them  as told by your doctor. This information is not intended to replace advice given to you by your health care provider. Make sure you discuss any questions you have with your health care provider. Document Released: 05/06/2010 Document Revised: 11/16/2017 Document Reviewed: 11/16/2017 Elsevier Patient Education  2020 [employer]  

## 2019-10-13 ENCOUNTER — Telehealth: Payer: Self-pay | Admitting: Internal Medicine

## 2019-10-13 NOTE — Telephone Encounter (Signed)
Spoke to pt and she is aware as instructed and she stated she has an EPI pen just in case

## 2019-10-13 NOTE — Telephone Encounter (Signed)
Patient had her flu shot yesterday at her visit. She stated that today she is having a reaction to the shot  Patient said the injection site is very warm to the touch, very swollen and the size of a half dollar. Patient is not sure if this needs to be looked at or what she should do

## 2019-10-13 NOTE — Telephone Encounter (Signed)
This is likely a local reaction. Apply cool compress, take Benadryl 25 mg Q8H and can apply Hydrocortisone cream OTC topically. Notify me if worsens.

## 2019-10-18 ENCOUNTER — Encounter: Payer: Self-pay | Admitting: Internal Medicine

## 2019-10-19 ENCOUNTER — Encounter: Payer: Self-pay | Admitting: Internal Medicine

## 2019-10-19 ENCOUNTER — Ambulatory Visit (INDEPENDENT_AMBULATORY_CARE_PROVIDER_SITE_OTHER): Payer: BC Managed Care – PPO | Admitting: Internal Medicine

## 2019-10-19 DIAGNOSIS — J029 Acute pharyngitis, unspecified: Secondary | ICD-10-CM | POA: Diagnosis not present

## 2019-10-19 LAB — POCT RAPID STREP A (OFFICE): Rapid Strep A Screen: NEGATIVE

## 2019-10-19 LAB — POC INFLUENZA A&B (BINAX/QUICKVUE)
Influenza A, POC: NEGATIVE
Influenza B, POC: NEGATIVE

## 2019-10-19 NOTE — Addendum Note (Signed)
Addended by: Lurlean Nanny on: 10/19/2019 11:12 AM   Modules accepted: Orders

## 2019-10-19 NOTE — Progress Notes (Signed)
Virtual Visit via Video Note  I connected with Maria Bright on 10/19/19 at  9:30 AM EST by a video enabled telemedicine application and verified that I am speaking with the correct person using two identifiers.  Location: Patient: Home Provider: Office   I discussed the limitations of evaluation and management by telemedicine and the availability of in person appointments. The patient expressed understanding and agreed to proceed.  History of Present Illness:  Pt reports sore throat that has been ongoing for 5 days. She mentions she had flu symptoms that started on Friday (after getting her flu shot). The symptoms continued into Sunday. She reports she has been having a sore throat since. She has pain when swallowing and it is worse on left side. She denies sand-paper feeling but just painful. She also mentions lymph node tenderness to bilateral sides of neck with left side worse She denies headache, runny nose, nasal congestion, ear pain, cough or shortness of breath. She has tried throat lozanges but no cough drops. She is taking Nyquil and Tylenol with minimal relief. She feels like she is running a low-grade temperature but has not actually taken it with a thermometer. She has had strep throat in the past and reports this feels similar. She has not had sick contacts diagnosed with COVID.   Past Medical History:  Diagnosis Date  . ADHD (attention deficit hyperactivity disorder)   . Anxiety   . Complication of anesthesia    30 years ago ; woke up in middle of wisdom tooth extraction   . Depression   . GERD (gastroesophageal reflux disease)   . Hypertension   . Polycystic disease, ovaries    POC   . Seasonal allergies   . Sleep apnea    CPAP     Current Outpatient Medications  Medication Sig Dispense Refill  . buPROPion (WELLBUTRIN XL) 150 MG 24 hr tablet Take 1 tablet (150 mg total) by mouth daily. 90 tablet 0  . cetirizine (ZYRTEC) 10 MG tablet Take 10 mg by mouth at bedtime.      Marland Kitchen desvenlafaxine (PRISTIQ) 100 MG 24 hr tablet Take 100 mg by mouth every evening.     . diphenhydrAMINE (BENADRYL) 25 MG tablet Take 50 mg by mouth every 6 (six) hours as needed for itching or allergies.    Marland Kitchen gabapentin (NEURONTIN) 300 MG capsule TAKE 1 CAPSULE (300 MG TOTAL) BY MOUTH AT BEDTIME. MUST SCHEDULE PHYSICAL 90 capsule 0  . lisdexamfetamine (VYVANSE) 20 MG capsule Take 1 capsule (20 mg total) by mouth 2 (two) times daily. 60 capsule 0  . norethindrone-ethinyl estradiol (JUNEL FE,GILDESS FE,LOESTRIN FE) 1-20 MG-MCG tablet Take 1 tablet by mouth daily. 3 Package 1  . omeprazole (PRILOSEC OTC) 20 MG tablet Take 20 mg by mouth as needed.     . propranolol (INDERAL) 10 MG tablet Take 10 mg by mouth daily. Anxiety     No current facility-administered medications for this visit.     Allergies  Allergen Reactions  . Morphine And Related Other (See Comments)    Unknown   . Adhesive [Tape] Rash    blisters  . Nickel Rash    Family History  Problem Relation Age of Onset  . Arthritis Mother   . Hyperlipidemia Mother   . Heart disease Mother   . Hypertension Mother   . Rheum arthritis Maternal Aunt   . Hyperlipidemia Paternal Aunt   . Diabetes Paternal Aunt   . Rheum arthritis Maternal Grandmother   . Heart  disease Maternal Grandmother   . Arthritis Maternal Grandfather   . Colon cancer Maternal Grandfather   . Hyperlipidemia Maternal Grandfather   . Heart disease Maternal Grandfather   . Hypertension Maternal Grandfather     Social History   Socioeconomic History  . Marital status: Married    Spouse name: Not on file  . Number of children: Not on file  . Years of education: Not on file  . Highest education level: Not on file  Occupational History  . Not on file  Social Needs  . Financial resource strain: Not on file  . Food insecurity    Worry: Not on file    Inability: Not on file  . Transportation needs    Medical: Not on file    Non-medical: Not on file   Tobacco Use  . Smoking status: Never Smoker  . Smokeless tobacco: Never Used  Substance and Sexual Activity  . Alcohol use: No    Comment: seldom   . Drug use: No  . Sexual activity: Yes    Birth control/protection: Pill  Lifestyle  . Physical activity    Days per week: Not on file    Minutes per session: Not on file  . Stress: Not on file  Relationships  . Social Herbalist on phone: Not on file    Gets together: Not on file    Attends religious service: Not on file    Active member of club or organization: Not on file    Attends meetings of clubs or organizations: Not on file    Relationship status: Not on file  . Intimate partner violence    Fear of current or ex partner: Not on file    Emotionally abused: Not on file    Physically abused: Not on file    Forced sexual activity: Not on file  Other Topics Concern  . Not on file  Social History Narrative  . Not on file     Constitutional: Pt reports malaise. Denies fever, fatigue, headache or abrupt weight changes.  HEENT: Pt reports sore throat. Denies eye pain, eye redness, ear pain, ringing in the ears, wax buildup, runny nose or nasal congestion.  Respiratory: Denies difficulty breathing, shortness of breath, cough or sputum production.   Cardiovascular: Denies chest pain, chest tightness, palpitations or swelling in the hands or feet.   No other specific complaints in a complete review of systems (except as listed in HPI above).    Observations/Objective:  Wt Readings from Last 3 Encounters:  10/12/19 (!) 311 lb (141.1 kg)  09/15/18 (!) 307 lb 8 oz (139.5 kg)  10/01/17 290 lb (131.5 kg)    General: Appears her stated age, obese, in NAD. HEENT: Throat/Mouth: Teeth present, mucosa pink and moist, no exudate, lesions or ulcerations noted. + PND. Neck:  No obvious adenopathy. Pulmonary/Chest: Normal effort. No respiratory distress.  Neurological: Alert and oriented.   BMET    Component Value  Date/Time   NA 137 03/15/2019 0820   K 3.7 03/15/2019 0820   CL 101 03/15/2019 0820   CO2 27 03/15/2019 0820   GLUCOSE 98 03/15/2019 0820   BUN 14 03/15/2019 0820   CREATININE 0.89 03/15/2019 0820   CALCIUM 9.1 03/15/2019 0820   GFRNONAA >60 06/21/2017 1553   GFRAA >60 06/21/2017 1553    Lipid Panel     Component Value Date/Time   CHOL 178 10/01/2017 1458   TRIG 128.0 10/01/2017 1458   HDL 57.70 10/01/2017  1458   CHOLHDL 3 10/01/2017 1458   VLDL 25.6 10/01/2017 1458   LDLCALC 95 10/01/2017 1458    CBC    Component Value Date/Time   WBC 7.8 03/15/2019 0820   RBC 4.84 03/15/2019 0820   HGB 14.5 03/15/2019 0820   HCT 42.4 03/15/2019 0820   PLT 268.0 03/15/2019 0820   MCV 87.5 03/15/2019 0820   MCH 29.5 06/04/2017 2253   MCHC 34.3 03/15/2019 0820   RDW 14.0 03/15/2019 0820   LYMPHSABS 2.7 06/04/2017 2253   MONOABS 0.9 06/04/2017 2253   EOSABS 0.4 06/04/2017 2253   BASOSABS 0.0 06/04/2017 2253    Hgb A1C Lab Results  Component Value Date   HGBA1C 5.4 10/01/2017        Assessment and Plan:  Sore Throat:  RST: negative Rapid Flu: negative Advised her to get [facility] test site Can take Ibuprofen 400 mg every 8 hours as needed for pain and inflammation Encouraged warm beverages, with honey for comfort Discussed the importance of self quarantine, hand washing, masking and social distancing ER precautions discussed  Follow Up Instructions:    I discussed the assessment and treatment plan with the patient. The patient was provided an opportunity to ask questions and all were answered. The patient agreed with the plan and demonstrated an understanding of the instructions.   The patient was advised to call back or seek an in-person evaluation if the symptoms worsen or if the condition fails to improve as anticipated.    [name], NP

## 2019-10-19 NOTE — Patient Instructions (Signed)

## 2019-12-04 ENCOUNTER — Other Ambulatory Visit: Payer: Self-pay | Admitting: Internal Medicine

## 2019-12-04 MED ORDER — BUPROPION HCL ER (XL) 300 MG PO TB24: 300.0000 mg | ORAL_TABLET | Freq: Every day | ORAL | 1 refills | Status: DC

## 2019-12-04 NOTE — Telephone Encounter (Signed)
Patient stated that at her last visit that her medication was being changed. She stated that her buPROPion should have been changed to 300mg  instead of 150mg . When the patient when to the pharmacy to pick up the script it was not changed and they filled it for the 150. The pharmacy is requesting a new script with correct dosage. Patient is almost out of medication. And next refill date is not until february she said

## 2019-12-04 NOTE — Addendum Note (Signed)
Addended by: Jearld Fenton on: 12/04/2019 03:44 PM   Modules accepted: Orders

## 2019-12-04 NOTE — Telephone Encounter (Signed)
RX sent to [facility]

## 2019-12-27 ENCOUNTER — Other Ambulatory Visit: Payer: Self-pay | Admitting: Internal Medicine

## 2020-01-02 ENCOUNTER — Encounter: Payer: Self-pay | Admitting: Internal Medicine

## 2020-01-03 ENCOUNTER — Other Ambulatory Visit: Payer: Self-pay

## 2020-01-03 MED ORDER — LISDEXAMFETAMINE DIMESYLATE 20 MG PO CAPS: 20.0000 mg | ORAL_CAPSULE | Freq: Two times a day (BID) | ORAL | 0 refills | Status: DC

## 2020-01-03 NOTE — Telephone Encounter (Signed)
Last filled 10/12/2019.Marland KitchenMarland KitchenMarland Kitchen please advise

## 2020-01-08 ENCOUNTER — Other Ambulatory Visit: Payer: Self-pay | Admitting: Internal Medicine

## 2020-01-24 ENCOUNTER — Encounter: Payer: Self-pay | Admitting: *Deleted

## 2020-01-31 ENCOUNTER — Encounter: Payer: Self-pay | Admitting: Internal Medicine

## 2020-01-31 ENCOUNTER — Other Ambulatory Visit: Payer: Self-pay | Admitting: Internal Medicine

## 2020-01-31 MED ORDER — DESVENLAFAXINE SUCCINATE ER 100 MG PO TB24: 100.0000 mg | ORAL_TABLET | Freq: Every evening | ORAL | 0 refills | Status: DC

## 2020-01-31 MED ORDER — LISDEXAMFETAMINE DIMESYLATE 20 MG PO CAPS: 20.0000 mg | ORAL_CAPSULE | Freq: Two times a day (BID) | ORAL | 0 refills | Status: DC

## 2020-01-31 NOTE — Telephone Encounter (Signed)
Last filled 01/03/2020... please advise

## 2020-02-09 ENCOUNTER — Telehealth: Payer: Self-pay

## 2020-02-09 NOTE — Telephone Encounter (Signed)
Referral closed.  See Referral Notes/hx.  Pt has not responded to phone calls or Unable to Contact letter.  

## 2020-02-15 ENCOUNTER — Other Ambulatory Visit: Payer: Self-pay

## 2020-02-15 ENCOUNTER — Ambulatory Visit (INDEPENDENT_AMBULATORY_CARE_PROVIDER_SITE_OTHER): Payer: BC Managed Care – PPO | Admitting: Internal Medicine

## 2020-02-15 ENCOUNTER — Encounter: Payer: Self-pay | Admitting: Internal Medicine

## 2020-02-15 VITALS — BP 136/88 | HR 73 | Temp 97.7°F | Ht 65.75 in | Wt 308.0 lb

## 2020-02-15 DIAGNOSIS — E282 Polycystic ovarian syndrome: Secondary | ICD-10-CM

## 2020-02-15 DIAGNOSIS — M5441 Lumbago with sciatica, right side: Secondary | ICD-10-CM

## 2020-02-15 DIAGNOSIS — Z1231 Encounter for screening mammogram for malignant neoplasm of breast: Secondary | ICD-10-CM | POA: Diagnosis not present

## 2020-02-15 DIAGNOSIS — Z23 Encounter for immunization: Secondary | ICD-10-CM | POA: Diagnosis not present

## 2020-02-15 DIAGNOSIS — Z0000 Encounter for general adult medical examination without abnormal findings: Secondary | ICD-10-CM | POA: Diagnosis not present

## 2020-02-15 DIAGNOSIS — R197 Diarrhea, unspecified: Secondary | ICD-10-CM | POA: Diagnosis not present

## 2020-02-15 DIAGNOSIS — F901 Attention-deficit hyperactivity disorder, predominantly hyperactive type: Secondary | ICD-10-CM

## 2020-02-15 DIAGNOSIS — F32A Depression, unspecified: Secondary | ICD-10-CM

## 2020-02-15 DIAGNOSIS — M5412 Radiculopathy, cervical region: Secondary | ICD-10-CM

## 2020-02-15 DIAGNOSIS — I10 Essential (primary) hypertension: Secondary | ICD-10-CM | POA: Diagnosis not present

## 2020-02-15 DIAGNOSIS — K219 Gastro-esophageal reflux disease without esophagitis: Secondary | ICD-10-CM | POA: Diagnosis not present

## 2020-02-15 DIAGNOSIS — F329 Major depressive disorder, single episode, unspecified: Secondary | ICD-10-CM

## 2020-02-15 DIAGNOSIS — M5442 Lumbago with sciatica, left side: Secondary | ICD-10-CM | POA: Diagnosis not present

## 2020-02-15 DIAGNOSIS — F419 Anxiety disorder, unspecified: Secondary | ICD-10-CM

## 2020-02-15 DIAGNOSIS — G8929 Other chronic pain: Secondary | ICD-10-CM

## 2020-02-15 NOTE — Assessment & Plan Note (Signed)
Continue Gabepentin

## 2020-02-15 NOTE — Assessment & Plan Note (Signed)
Continue Pristiq and Wellbutrin Will monitor

## 2020-02-15 NOTE — Assessment & Plan Note (Signed)
Continue Vyvanse

## 2020-02-15 NOTE — Addendum Note (Signed)
Addended by: Lurlean Nanny on: 02/15/2020 04:55 PM   Modules accepted: Orders

## 2020-02-15 NOTE — Assessment & Plan Note (Signed)
Continue Gabapentin

## 2020-02-15 NOTE — Assessment & Plan Note (Signed)
Avoid NSAID's Continue Omeprazole CBC and CMET today

## 2020-02-15 NOTE — Assessment & Plan Note (Signed)
No intervention planned

## 2020-02-15 NOTE — Assessment & Plan Note (Signed)
Continue Propanolol Will monitor

## 2020-02-15 NOTE — Patient Instructions (Signed)
Health Maintenance, Female Adopting a healthy lifestyle and getting preventive care are important in promoting health and wellness. Ask your health care provider about:  The right schedule for you to have regular tests and exams.  Things you can do on your own to prevent diseases and keep yourself healthy. What should I know about diet, weight, and exercise? Eat a healthy diet   Eat a diet that includes plenty of vegetables, fruits, low-fat dairy products, and lean protein.  Do not eat a lot of foods that are high in solid fats, added sugars, or sodium. Maintain a healthy weight Body mass index (BMI) is used to identify weight problems. It estimates body fat based on height and weight. Your health care provider can help determine your BMI and help you achieve or maintain a healthy weight. Get regular exercise Get regular exercise. This is one of the most important things you can do for your health. Most adults should:  Exercise for at least 150 minutes each week. The exercise should increase your heart rate and make you sweat (moderate-intensity exercise).  Do strengthening exercises at least twice a week. This is in addition to the moderate-intensity exercise.  Spend less time sitting. Even light physical activity can be beneficial. Watch cholesterol and blood lipids Have your blood tested for lipids and cholesterol at 42 years of age, then have this test every 5 years. Have your cholesterol levels checked more often if:  Your lipid or cholesterol levels are high.  You are older than 42 years of age.  You are at high risk for heart disease. What should I know about cancer screening? Depending on your health history and family history, you may need to have cancer screening at various ages. This may include screening for:  Breast cancer.  Cervical cancer.  Colorectal cancer.  Skin cancer.  Lung cancer. What should I know about heart disease, diabetes, and high blood  pressure? Blood pressure and heart disease  High blood pressure causes heart disease and increases the risk of stroke. This is more likely to develop in people who have high blood pressure readings, are of African descent, or are overweight.  Have your blood pressure checked: ? Every 3-5 years if you are 18-39 years of age. ? Every year if you are 40 years old or older. Diabetes Have regular diabetes screenings. This checks your fasting blood sugar level. Have the screening done:  Once every three years after age 40 if you are at a normal weight and have a low risk for diabetes.  More often and at a younger age if you are overweight or have a high risk for diabetes. What should I know about preventing infection? Hepatitis B If you have a higher risk for hepatitis B, you should be screened for this virus. Talk with your health care provider to find out if you are at risk for hepatitis B infection. Hepatitis C Testing is recommended for:  Everyone born from 1945 through 1965.  Anyone with known risk factors for hepatitis C. Sexually transmitted infections (STIs)  Get screened for STIs, including gonorrhea and chlamydia, if: ? You are sexually active and are younger than 42 years of age. ? You are older than 42 years of age and your health care provider tells you that you are at risk for this type of infection. ? Your sexual activity has changed since you were last screened, and you are at increased risk for chlamydia or gonorrhea. Ask your health care provider if   you are at risk.  Ask your health care provider about whether you are at high risk for HIV. Your health care provider may recommend a prescription medicine to help prevent HIV infection. If you choose to take medicine to prevent HIV, you should first get tested for HIV. You should then be tested every 3 months for as long as you are taking the medicine. Pregnancy  If you are about to stop having your period (premenopausal) and  you may become pregnant, seek counseling before you get pregnant.  Take 400 to 800 micrograms (mcg) of folic acid every day if you become pregnant.  Ask for birth control (contraception) if you want to prevent pregnancy. Osteoporosis and menopause Osteoporosis is a disease in which the bones lose minerals and strength with aging. This can result in bone fractures. If you are 65 years old or older, or if you are at risk for osteoporosis and fractures, ask your health care provider if you should:  Be screened for bone loss.  Take a calcium or vitamin D supplement to lower your risk of fractures.  Be given hormone replacement therapy (HRT) to treat symptoms of menopause. Follow these instructions at home: Lifestyle  Do not use any products that contain nicotine or tobacco, such as cigarettes, e-cigarettes, and chewing tobacco. If you need help quitting, ask your health care provider.  Do not use street drugs.  Do not share needles.  Ask your health care provider for help if you need support or information about quitting drugs. Alcohol use  Do not drink alcohol if: ? Your health care provider tells you not to drink. ? You are pregnant, may be pregnant, or are planning to become pregnant.  If you drink alcohol: ? Limit how much you use to 0-1 drink a day. ? Limit intake if you are breastfeeding.  Be aware of how much alcohol is in your drink. In the [place] one drink equals one 12 oz bottle of beer (355 mL), one 5 oz glass of wine (148 mL), or one 1 oz glass of hard liquor (44 mL). General instructions  Schedule regular health, dental, and eye exams.  Stay current with your vaccines.  Tell your health care provider if: ? You often feel depressed. ? You have ever been abused or do not feel safe at home. Summary  Adopting a healthy lifestyle and getting preventive care are important in promoting health and wellness.  Follow your health care provider's instructions about healthy  diet, exercising, and getting tested or screened for diseases.  Follow your health care provider's instructions on monitoring your cholesterol and blood pressure. This information is not intended to replace advice given to you by your health care provider. Make sure you discuss any questions you have with your health care provider. Document Revised: 11/09/2018 Document Reviewed: 11/09/2018 [employer]vier Patient Education  2020 [employer]  

## 2020-02-15 NOTE — Progress Notes (Signed)
Subjective:    Patient ID: Maria Bright, female    DOB: March 05, 1976, 44 y.o.   MRN: [id number]  HPI  Pt presents to the clinic today for her annual exam. She is also due to follow up chronic conditions.  ADHD: Managed on Vyvanse. She feels like it is working well for her.  Anxiety and Depression: Chronic. She is taking Pristiq and Wellbutrin as prescribed. She denies SI/HI.  Chronic Low Back Pain: She takes Gabapentin with good relief of symptoms. She is going to get a massage today.  GERD: Triggered by NSAID's. She takes Omeprazole as needed with good relief of symptoms. There is no upper GI on file.   HTN: Her BP today is 136/88. She is taking Propanolol as prescribed. ECG from reviewed.  PCOS: She was on OCP's in the past. She is not taking anything for this.  Flu: 10/2019 Tetanus: 11/2009 Pap Smear: 08/2019 Mammogram: never Vision Screening: as needed Dentist: biannually  Diet: She does eat meat. She consumes fruits and veggies daily. She tries to avoid fried foods. She drinks mostly water, coffee, 1 soda daily, milk. Exercise: None  Review of Systems      Past Medical History:  Diagnosis Date  . ADHD (attention deficit hyperactivity disorder)   . Anxiety   . Complication of anesthesia    30 years ago ; woke up in middle of wisdom tooth extraction   . Depression   . GERD (gastroesophageal reflux disease)   . Hypertension   . Polycystic disease, ovaries    POC   . Seasonal allergies   . Sleep apnea    CPAP     Current Outpatient Medications  Medication Sig Dispense Refill  . buPROPion (WELLBUTRIN XL) 300 MG 24 hr tablet Take 1 tablet (300 mg total) by mouth daily. 90 tablet 1  . cetirizine (ZYRTEC) 10 MG tablet Take 10 mg by mouth at bedtime.     Marland Kitchen desvenlafaxine (PRISTIQ) 100 MG 24 hr tablet Take 1 tablet (100 mg total) by mouth every evening. 90 tablet 0  . diphenhydrAMINE (BENADRYL) 25 MG tablet Take 50 mg by mouth every 6 (six) hours as needed for  itching or allergies.    Marland Kitchen gabapentin (NEURONTIN) 300 MG capsule TAKE 1 CAPSULE (300 MG TOTAL) BY MOUTH AT BEDTIME. MUST SCHEDULE PHYSICAL 90 capsule 0  . lisdexamfetamine (VYVANSE) 20 MG capsule Take 1 capsule (20 mg total) by mouth 2 (two) times daily. 60 capsule 0  . norethindrone-ethinyl estradiol (JUNEL FE,GILDESS FE,LOESTRIN FE) 1-20 MG-MCG tablet Take 1 tablet by mouth daily. 3 Package 1  . omeprazole (PRILOSEC OTC) 20 MG tablet Take 20 mg by mouth as needed.     . propranolol (INDERAL) 10 MG tablet Take 10 mg by mouth daily. Anxiety     No current facility-administered medications for this visit.    Allergies  Allergen Reactions  . Morphine And Related Other (See Comments)    Unknown   . Adhesive [Tape] Rash    blisters  . Nickel Rash    Family History  Problem Relation Age of Onset  . Arthritis Mother   . Hyperlipidemia Mother   . Heart disease Mother   . Hypertension Mother   . Rheum arthritis Maternal Aunt   . Hyperlipidemia Paternal Aunt   . Diabetes Paternal Aunt   . Rheum arthritis Maternal Grandmother   . Heart disease Maternal Grandmother   . Arthritis Maternal Grandfather   . Colon cancer Maternal Grandfather   . Hyperlipidemia  Maternal Grandfather   . Heart disease Maternal Grandfather   . Hypertension Maternal Grandfather     Social History   Socioeconomic History  . Marital status: Married    Spouse name: Not on file  . Number of children: Not on file  . Years of education: Not on file  . Highest education level: Not on file  Occupational History  . Not on file  Tobacco Use  . Smoking status: Never Smoker  . Smokeless tobacco: Never Used  Substance and Sexual Activity  . Alcohol use: No    Comment: seldom   . Drug use: No  . Sexual activity: Yes    Birth control/protection: Pill  Other Topics Concern  . Not on file  Social History Narrative  . Not on file   Social Determinants of Health   Financial Resource Strain:   . Difficulty of  Paying Living Expenses:   Food Insecurity:   . Worried About Charity fundraiser in the Last Year:   . Arboriculturist in the Last Year:   Transportation Needs:   . Film/video editor (Medical):   Marland Kitchen Lack of Transportation (Non-Medical):   Physical Activity:   . Days of Exercise per Week:   . Minutes of Exercise per Session:   Stress:   . Feeling of Stress :   Social Connections:   . Frequency of Communication with Friends and Family:   . Frequency of Social Gatherings with Friends and Family:   . Attends Religious Services:   . Active Member of Clubs or Organizations:   . Attends Archivist Meetings:   Marland Kitchen Marital Status:   Intimate Partner Violence:   . Fear of Current or Ex-Partner:   . Emotionally Abused:   Marland Kitchen Physically Abused:   . Sexually Abused:      Constitutional: Denies fever, malaise, fatigue, headache or abrupt weight changes.  HEENT: Denies eye pain, eye redness, ear pain, ringing in the ears, wax buildup, runny nose, nasal congestion, bloody nose, or sore throat. Respiratory: Denies difficulty breathing, shortness of breath, cough or sputum production.   Cardiovascular: Denies chest pain, chest tightness, palpitations or swelling in the hands or feet.  Gastrointestinal: Pt reports diarrhea. Denies abdominal pain, bloating, constipation, or blood in the stool.  GU: Denies urgency, frequency, pain with urination, burning sensation, blood in urine, odor or discharge. Musculoskeletal: Pt reports chronic neck and back pain. Denies decrease in range of motion, difficulty with gait, or joint swelling.  Skin: Denies redness, rashes, lesions or ulcercations.  Neurological: Denies dizziness, difficulty with memory, difficulty with speech or problems with balance and coordination.  Psych: Pt has a history of anxiety and depression. Denies SI/HI.  No other specific complaints in a complete review of systems (except as listed in HPI above).  Objective:   Physical  Exam  BP 136/88   Pulse 73   Temp 97.7 F (36.5 C) (Temporal)   Ht 5' 5.75" (1.67 m)   Wt (!) 308 lb (139.7 kg)   LMP 01/11/2020   SpO2 98%   BMI 50.09 kg/m   Wt Readings from Last 3 Encounters:  10/12/19 (!) 311 lb (141.1 kg)  09/15/18 (!) 307 lb 8 oz (139.5 kg)  10/01/17 290 lb (131.5 kg)    General: Appears her stated age, obese, in NAD. Skin: Warm, dry and intact. No rashes, lesions or ulcerations noted. HEENT: Head: normal shape and size; Eyes: sclera white, no icterus, conjunctiva pink, PERRLA and EOMs  intact;  Neck:  Neck supple, trachea midline. No masses, lumps or thyromegaly present.  Cardiovascular: Normal rate and rhythm. S1,S2 noted.  No murmur, rubs or gallops noted. No JVD or BLE edema.  Pulmonary/Chest: Normal effort and positive vesicular breath sounds. No respiratory distress. No wheezes, rales or ronchi noted.  Abdomen: Soft and nontender. Normal bowel sounds. No distention or masses noted. Liver, spleen and kidneys non palpable. Musculoskeletal: Strength 5/5 BUE/BLE. No difficulty with gait.  Neurological: Alert and oriented. Cranial nerves II-XII grossly intact. Coordination normal.  Psychiatric: Mood and affect normal. Behavior is normal. Judgment and thought content normal.     BMET    Component Value Date/Time   NA 137 03/15/2019 0820   K 3.7 03/15/2019 0820   CL 101 03/15/2019 0820   CO2 27 03/15/2019 0820   GLUCOSE 98 03/15/2019 0820   BUN 14 03/15/2019 0820   CREATININE 0.89 03/15/2019 0820   CALCIUM 9.1 03/15/2019 0820   GFRNONAA >60 06/21/2017 1553   GFRAA >60 06/21/2017 1553    Lipid Panel     Component Value Date/Time   CHOL 178 10/01/2017 1458   TRIG 128.0 10/01/2017 1458   HDL 57.70 10/01/2017 1458   CHOLHDL 3 10/01/2017 1458   VLDL 25.6 10/01/2017 1458   LDLCALC 95 10/01/2017 1458    CBC    Component Value Date/Time   WBC 7.8 03/15/2019 0820   RBC 4.84 03/15/2019 0820   HGB 14.5 03/15/2019 0820   HCT 42.4 03/15/2019  0820   PLT 268.0 03/15/2019 0820   MCV 87.5 03/15/2019 0820   MCH 29.5 06/04/2017 2253   MCHC 34.3 03/15/2019 0820   RDW 14.0 03/15/2019 0820   LYMPHSABS 2.7 06/04/2017 2253   MONOABS 0.9 06/04/2017 2253   EOSABS 0.4 06/04/2017 2253   BASOSABS 0.0 06/04/2017 2253    Hgb A1C Lab Results  Component Value Date   HGBA1C 5.4 10/01/2017           Assessment & Plan:   Preventative Health Maintenance:  Encouraged her to get a flu shot in the fall Tetanus today Pap smear UTD- copy scanned into chart Mammogram ordered- she will [facility] schedule Encouraged her to consume a balanced diet and exercise regimen Advised her to see an eye doctor and dentist annually Will check CBC, CMET, TSH, Lipid, A1C and Vit D today  Diarrhea:  Will check Celiac panel  Will follow up after labs, return precautions discussed [name], NP This visit occurred during the SARS-CoV-2 public health emergency.  Safety protocols were in place, including screening questions prior to the visit, additional usage of staff PPE, and extensive cleaning of exam room while observing appropriate contact time as indicated for disinfecting solutions.

## 2020-02-16 LAB — LIPID PANEL
Cholesterol: 224 mg/dL — ABNORMAL HIGH (ref 0–200)
HDL: 59.3 mg/dL (ref >39.00)
LDL Cholesterol: 145 mg/dL — ABNORMAL HIGH (ref 0–99)
NonHDL: 164.69
Total CHOL/HDL Ratio: 4
Triglycerides: 96 mg/dL (ref 0.0–149.0)
VLDL: 19.2 mg/dL (ref 0.0–40.0)

## 2020-02-16 LAB — COMPREHENSIVE METABOLIC PANEL
ALT: 27 U/L (ref 0–35)
AST: 23 U/L (ref 0–37)
Albumin: 4 g/dL (ref 3.5–5.2)
Alkaline Phosphatase: 91 U/L (ref 39–117)
BUN: 10 mg/dL (ref 6–23)
CO2: 27 mEq/L (ref 19–32)
Calcium: 9.3 mg/dL (ref 8.4–10.5)
Chloride: 100 mEq/L (ref 96–112)
Creatinine, Ser: 0.94 mg/dL (ref 0.40–1.20)
GFR: 65.51 mL/min (ref >60.00)
Glucose, Bld: 88 mg/dL (ref 70–99)
Potassium: 4.3 mEq/L (ref 3.5–5.1)
Sodium: 134 mEq/L — ABNORMAL LOW (ref 135–145)
Total Bilirubin: 0.3 mg/dL (ref 0.2–1.2)
Total Protein: 7.4 g/dL (ref 6.0–8.3)

## 2020-02-16 LAB — CBC
HCT: 43.1 % (ref 36.0–46.0)
Hemoglobin: 14.5 g/dL (ref 12.0–15.0)
MCHC: 33.6 g/dL (ref 30.0–36.0)
MCV: 89.3 fl (ref 78.0–100.0)
Platelets: 260 10*3/uL (ref 150.0–400.0)
RBC: 4.82 Mil/uL (ref 3.87–5.11)
RDW: 14 % (ref 11.5–15.5)
WBC: 8 10*3/uL (ref 4.0–10.5)

## 2020-02-16 LAB — TSH: TSH: 1.86 u[IU]/mL (ref 0.35–4.50)

## 2020-02-16 LAB — HEMOGLOBIN A1C: Hgb A1c MFr Bld: 5.4 % (ref 4.6–6.5)

## 2020-02-16 LAB — VITAMIN D 25 HYDROXY (VIT D DEFICIENCY, FRACTURES): VITD: 32.57 ng/mL (ref 30.00–100.00)

## 2020-02-17 LAB — CELIAC PANEL 10
Antigliadin Abs, IgA: 7 units (ref 0–19)
Endomysial IgA: NEGATIVE
Gliadin IgG: 3 units (ref 0–19)
IgA/Immunoglobulin A, Serum: 169 mg/dL (ref 87–352)
Tissue Transglut Ab: 5 U/mL (ref 0–5)
Transglutaminase IgA: <2 U/mL (ref 0–3)

## 2020-02-23 ENCOUNTER — Other Ambulatory Visit: Payer: Self-pay | Admitting: Family Medicine

## 2020-02-23 ENCOUNTER — Encounter: Payer: Self-pay | Admitting: Internal Medicine

## 2020-02-23 DIAGNOSIS — J4520 Mild intermittent asthma, uncomplicated: Secondary | ICD-10-CM

## 2020-02-23 NOTE — Telephone Encounter (Signed)
This is a duplicate request, msg has been sent to [facility] already

## 2020-02-26 DIAGNOSIS — J45909 Unspecified asthma, uncomplicated: Secondary | ICD-10-CM | POA: Insufficient documentation

## 2020-02-26 MED ORDER — METAXALONE 800 MG PO TABS: 800.0000 mg | ORAL_TABLET | Freq: Every day | ORAL | 0 refills | Status: DC | PRN

## 2020-02-26 MED ORDER — FLUTICASONE PROPIONATE 50 MCG/ACT NA SUSP: 2.0000 | Freq: Every day | NASAL | 6 refills | Status: AC

## 2020-02-26 MED ORDER — ALBUTEROL SULFATE HFA 108 (90 BASE) MCG/ACT IN AERS: 2.0000 | INHALATION_SPRAY | Freq: Four times a day (QID) | RESPIRATORY_TRACT | 5 refills | Status: AC | PRN

## 2020-02-26 NOTE — Addendum Note (Signed)
Addended by: Lurlean Nanny on: 02/26/2020 04:54 PM   Modules accepted: Orders

## 2020-02-26 NOTE — Addendum Note (Signed)
Addended by: Lurlean Nanny on: 02/26/2020 10:34 AM   Modules accepted: Orders

## 2020-03-21 ENCOUNTER — Encounter: Payer: Self-pay | Admitting: Internal Medicine

## 2020-03-22 ENCOUNTER — Ambulatory Visit
Admission: EM | Admit: 2020-03-22 | Discharge: 2020-03-22 | Disposition: A | Discharge status: Home / Self Care | Payer: BC Managed Care – PPO | Attending: Emergency Medicine | Admitting: Emergency Medicine

## 2020-03-22 DIAGNOSIS — N3001 Acute cystitis with hematuria: Secondary | ICD-10-CM | POA: Diagnosis present

## 2020-03-22 LAB — POCT URINALYSIS DIP (MANUAL ENTRY)
Bilirubin, UA: NEGATIVE
Glucose, UA: 100 mg/dL — AB
Ketones, POC UA: NEGATIVE mg/dL
Nitrite, UA: POSITIVE — AB
Protein Ur, POC: 30 mg/dL — AB
Spec Grav, UA: >=1.03 — AB (ref 1.010–1.025)
Urobilinogen, UA: 0.2 E.U./dL
pH, UA: 6 (ref 5.0–8.0)

## 2020-03-22 MED ORDER — FLUCONAZOLE 150 MG PO TABS: 150.0000 mg | ORAL_TABLET | Freq: Every day | ORAL | 0 refills | Status: DC

## 2020-03-22 MED ORDER — CEPHALEXIN 500 MG PO CAPS: 500.0000 mg | ORAL_CAPSULE | Freq: Two times a day (BID) | ORAL | 0 refills | Status: AC

## 2020-03-22 NOTE — Discharge Instructions (Addendum)
Take antibiotic twice daily with food. Important to drink plenty of water throughout the day. May continue Azo as needed for burning sensation. Return for worsening urinary symptoms, blood in urine, abdominal or back pain, fever. 

## 2020-03-22 NOTE — ED Triage Notes (Signed)
Pt c/o urinary burning, frequency and urgency for the past 9 days, now starting to have chills.

## 2020-03-22 NOTE — ED Provider Notes (Signed)
[facility]    CSN: [id number] Arrival date & time: 03/22/20  1804      History   Chief Complaint Chief Complaint  Patient presents with  . Urinary Tract Infection    HPI Maria Bright is a 44 y.o. female with history of obesity, hypertension, sleep apnea, allergies presenting for concern of UTI.  Endorsing burning with urination since Friday 4/16.  Has taken Azo with temporary relief.  Denies abdominal or back pain, fever.  Endorsing history of UTIs: States this feels similar.  Denies vaginal discharge, vaginal or pelvic pain.  Sexually active with 1 female partner who has undergone vasectomy.   Past Medical History:  Diagnosis Date  . ADHD (attention deficit hyperactivity disorder)   . Anxiety   . Complication of anesthesia    30 years ago ; woke up in middle of wisdom tooth extraction   . Depression   . GERD (gastroesophageal reflux disease)   . Hypertension   . Polycystic disease, ovaries    POC   . Seasonal allergies   . Sleep apnea    CPAP     Patient Active Problem List   Diagnosis Date Noted  . Allergy-induced asthma 02/26/2020  . 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 Anxiety and depression 06/07/2017  . ADHD 06/07/2017  . C6 radiculopathy 06/07/2017  . GERD (gastroesophageal reflux disease) 06/07/2017  . HTN (hypertension) 06/07/2017  . PCOS (polycystic ovarian syndrome) 06/07/2017  . Seasonal allergies 06/07/2017    Past Surgical History:  Procedure Laterality Date  . CHOLECYSTECTOMY N/A 06/30/2017   Procedure: LAPAROSCOPIC CHOLECYSTECTOMY;  Surgeon: Kinsinger, Arta Bruce, MD;  Location: [facility];  Service: General;  Laterality: N/A;  . WISDOM TOOTH EXTRACTION      OB History   No obstetric history on file.      Home Medications    Prior to Admission medications   Medication Sig Start Date End Date Taking? Authorizing Provider  albuterol (VENTOLIN HFA) 108 (90 Base) MCG/ACT inhaler Inhale 2 puffs into the lungs  every 6 (six) hours as needed for wheezing or shortness of breath. 02/26/20   Jearld Fenton, NP  buPROPion (WELLBUTRIN XL) 300 MG 24 hr tablet Take 1 tablet (300 mg total) by mouth daily. 12/04/19   Jearld Fenton, NP  cephALEXin (KEFLEX) 500 MG capsule Take 1 capsule (500 mg total) by mouth 2 (two) times daily for 5 days. 03/22/20 03/27/20  Hall-Potvin, [place], PA-C  cetirizine (ZYRTEC) 10 MG tablet Take 10 mg by mouth at bedtime.     [provider]  desvenlafaxine (PRISTIQ) 100 MG 24 hr tablet Take 1 tablet (100 mg total) by mouth every evening. 01/31/20   Jearld Fenton, NP  diphenhydrAMINE (BENADRYL) 25 MG tablet Take 50 mg by mouth every 6 (six) hours as needed for itching or allergies.    [provider]  fluconazole (DIFLUCAN) 150 MG tablet Take 1 tablet (150 mg total) by mouth daily. May repeat in 72 hours if needed 03/22/20   Hall-Potvin, [place], PA-C  fluticasone (FLONASE) 50 MCG/ACT nasal spray Place 2 sprays into both nostrils daily. 02/26/20   Jearld Fenton, NP  gabapentin (NEURONTIN) 300 MG capsule Take 300 mg by mouth. 1 capsule in the morning, 2 capsules at bedtime    [provider]  lisdexamfetamine (VYVANSE) 20 MG capsule Take 1 capsule (20 mg total) by mouth 2 (two) times daily. 01/31/20   Jearld Fenton, NP  metaxalone (SKELAXIN) 800 MG  tablet Take 1 tablet (800 mg total) by mouth daily as needed for muscle spasms. 02/26/20   Jearld Fenton, NP  omeprazole (PRILOSEC OTC) 20 MG tablet Take 20 mg by mouth as needed.     [provider]  propranolol (INDERAL) 10 MG tablet Take 10 mg by mouth daily. Anxiety 06/03/17   [provider]    Family History Family History  Problem Relation Age of Onset  . Arthritis Mother   . Hyperlipidemia Mother   . Heart disease Mother   . Hypertension Mother   . Rheum arthritis Maternal Aunt   . Hyperlipidemia Paternal Aunt   . Diabetes Paternal Aunt   . Rheum arthritis Maternal Grandmother   .  Heart disease Maternal Grandmother   . Arthritis Maternal Grandfather   . Colon cancer Maternal Grandfather   . Hyperlipidemia Maternal Grandfather   . Heart disease Maternal Grandfather   . Hypertension Maternal Grandfather     Social History Social History   Tobacco Use  . Smoking status: Never Smoker  . Smokeless tobacco: Never Used  Substance Use Topics  . Alcohol use: No    Comment: seldom   . Drug use: No     Allergies   Morphine and related, Adhesive [tape], and Nickel   Review of Systems As per HPI  Physical Exam Triage Vital Signs ED Triage Vitals  Enc Vitals Group     BP      Pulse      Resp      Temp      Temp src      SpO2      Weight      Height      Head Circumference      Peak Flow      Pain Score      Pain Loc      Pain Edu?      Excl. in [place]?    No data found.  Updated Vital Signs BP (!) 137/93 (BP Location: Left Arm)   Pulse 83   Temp 98.1 F (36.7 C) (Oral)   Resp 16   LMP 03/01/2020   SpO2 98%   Visual Acuity Right Eye Distance:   Left Eye Distance:   Bilateral Distance:    Right Eye Near:   Left Eye Near:    Bilateral Near:     Physical Exam Constitutional:      General: She is not in acute distress. HENT:     Head: Normocephalic and atraumatic.  Eyes:     General: No scleral icterus.    Pupils: Pupils are equal, round, and reactive to light.  Cardiovascular:     Rate and Rhythm: Normal rate.  Pulmonary:     Effort: Pulmonary effort is normal.  Abdominal:     Tenderness: There is no right CVA tenderness or left CVA tenderness.  Skin:    Coloration: Skin is not jaundiced or pale.  Neurological:     Mental Status: She is alert and oriented to person, place, and time.      UC Treatments / Results  Labs (all labs ordered are listed, but only abnormal results are displayed) Labs Reviewed  POCT URINALYSIS DIP (MANUAL ENTRY) - Abnormal; Notable for the following components:      Result Value   Clarity, UA  cloudy (*)    Glucose, UA =100 (*)    Spec Grav, UA >=1.030 (*)    Blood, UA small (*)    Protein  Ur, POC =30 (*)    Nitrite, UA Positive (*)    Leukocytes, UA Large (3+) (*)    All other components within normal limits  URINE CULTURE    EKG   Radiology No results found.  Procedures Procedures (including critical care time)  Medications Ordered in UC Medications - No data to display  Initial Impression / Assessment and Plan / UC Course  I have reviewed the triage vital signs and the nursing notes.  Pertinent labs & imaging results that were available during my care of the patient were reviewed by me and considered in my medical decision making (see chart for details).     Patient afebrile, nontoxic in office today.  Urine dipstick grossly abnormal, likely second to Azo use.  Will treat empirically for acute cystitis given symptoms.  Start Keflex, to follow with Diflucan as patient reports yeast infections s/p antibiotic use.  Urine culture pending: We will change antibiotic if indicated.  Return precautions discussed, patient verbalized understanding and is agreeable to plan. Final Clinical Impressions(s) / UC Diagnoses   Final diagnoses:  Acute cystitis with hematuria     Discharge Instructions     Take antibiotic twice daily with food. Important to drink plenty of water throughout the day. May continue Azo as needed for burning sensation. Return for worsening urinary symptoms, blood in urine, abdominal or back pain, fever.    ED Prescriptions    Medication Sig Dispense Auth. Provider   cephALEXin (KEFLEX) 500 MG capsule Take 1 capsule (500 mg total) by mouth 2 (two) times daily for 5 days. 10 capsule Hall-Potvin, [place], PA-C   fluconazole (DIFLUCAN) 150 MG tablet Take 1 tablet (150 mg total) by mouth daily. May repeat in 72 hours if needed 2 tablet Hall-Potvin, [place], PA-C     PDMP not reviewed this encounter.   Neldon [place], [place] 03/22/20  1832

## 2020-03-27 LAB — URINE CULTURE: Culture: 80000 — AB

## 2020-03-28 ENCOUNTER — Other Ambulatory Visit: Payer: Self-pay

## 2020-03-28 NOTE — Telephone Encounter (Signed)
Patient is requesting a refill on Vyvanse 20mg . Last refilled 01/31/20 for #60 with 0 refills. Patient was last seen 02/15/20 for a CPE and has no upcoming appts.

## 2020-03-29 ENCOUNTER — Other Ambulatory Visit: Payer: Self-pay | Admitting: Orthopedic Surgery

## 2020-03-29 DIAGNOSIS — M50122 Cervical disc disorder at C5-C6 level with radiculopathy: Secondary | ICD-10-CM

## 2020-03-29 MED ORDER — LISDEXAMFETAMINE DIMESYLATE 20 MG PO CAPS: 20.0000 mg | ORAL_CAPSULE | Freq: Two times a day (BID) | ORAL | 0 refills | Status: DC

## 2020-04-11 ENCOUNTER — Ambulatory Visit: Payer: BC Managed Care – PPO

## 2020-04-15 ENCOUNTER — Encounter: Payer: Self-pay | Admitting: Internal Medicine

## 2020-04-15 ENCOUNTER — Other Ambulatory Visit: Payer: Self-pay | Admitting: Internal Medicine

## 2020-04-16 MED ORDER — PROPRANOLOL HCL 10 MG PO TABS: 10.0000 mg | ORAL_TABLET | Freq: Every day | ORAL | 2 refills | Status: DC | PRN

## 2020-04-16 NOTE — Telephone Encounter (Signed)
I do not see where you have ever filled the Propanolol... please advise

## 2020-04-24 ENCOUNTER — Other Ambulatory Visit: Payer: Self-pay

## 2020-04-24 ENCOUNTER — Ambulatory Visit
Admission: RE | Admit: 2020-04-24 | Discharge: 2020-04-24 | Disposition: A | Discharge status: Home / Self Care | Payer: BC Managed Care – PPO | Source: Ambulatory Visit | Attending: Internal Medicine | Admitting: Internal Medicine

## 2020-04-24 DIAGNOSIS — Z1231 Encounter for screening mammogram for malignant neoplasm of breast: Secondary | ICD-10-CM

## 2020-04-25 ENCOUNTER — Other Ambulatory Visit: Payer: Self-pay | Admitting: Internal Medicine

## 2020-04-25 DIAGNOSIS — R928 Other abnormal and inconclusive findings on diagnostic imaging of breast: Secondary | ICD-10-CM

## 2020-04-28 ENCOUNTER — Other Ambulatory Visit: Payer: Self-pay | Admitting: Internal Medicine

## 2020-04-30 MED ORDER — LISDEXAMFETAMINE DIMESYLATE 20 MG PO CAPS: 20.0000 mg | ORAL_CAPSULE | Freq: Two times a day (BID) | ORAL | 0 refills | Status: DC

## 2020-04-30 NOTE — Telephone Encounter (Signed)
Last filled 03/29/2020... please advise

## 2020-05-07 ENCOUNTER — Other Ambulatory Visit: Payer: Self-pay

## 2020-05-07 ENCOUNTER — Ambulatory Visit
Admission: RE | Admit: 2020-05-07 | Discharge: 2020-05-07 | Disposition: A | Discharge status: Home / Self Care | Payer: BC Managed Care – PPO | Source: Ambulatory Visit | Attending: Orthopedic Surgery | Admitting: Orthopedic Surgery

## 2020-05-07 DIAGNOSIS — M50122 Cervical disc disorder at C5-C6 level with radiculopathy: Secondary | ICD-10-CM

## 2020-05-09 ENCOUNTER — Ambulatory Visit
Admission: RE | Admit: 2020-05-09 | Discharge: 2020-05-09 | Disposition: A | Discharge status: Home / Self Care | Payer: BC Managed Care – PPO | Source: Ambulatory Visit | Attending: Internal Medicine | Admitting: Internal Medicine

## 2020-05-09 ENCOUNTER — Other Ambulatory Visit: Payer: Self-pay

## 2020-05-09 DIAGNOSIS — R928 Other abnormal and inconclusive findings on diagnostic imaging of breast: Secondary | ICD-10-CM

## 2020-05-28 ENCOUNTER — Encounter: Payer: Self-pay | Admitting: Internal Medicine

## 2020-06-01 ENCOUNTER — Other Ambulatory Visit: Payer: Self-pay | Admitting: Internal Medicine

## 2020-06-11 ENCOUNTER — Other Ambulatory Visit: Payer: Self-pay

## 2020-06-11 MED ORDER — LISDEXAMFETAMINE DIMESYLATE 20 MG PO CAPS: 20.0000 mg | ORAL_CAPSULE | Freq: Two times a day (BID) | ORAL | 0 refills | Status: DC

## 2020-06-11 NOTE — Telephone Encounter (Signed)
Last filled 04/30/2020... please advise

## 2020-06-14 ENCOUNTER — Ambulatory Visit: Payer: BC Managed Care – PPO | Admitting: Internal Medicine

## 2020-06-14 ENCOUNTER — Encounter: Payer: Self-pay | Admitting: Internal Medicine

## 2020-06-14 ENCOUNTER — Other Ambulatory Visit: Payer: Self-pay

## 2020-06-14 VITALS — BP 130/84 | HR 74 | Temp 97.8°F | Wt 313.0 lb

## 2020-06-14 DIAGNOSIS — M5412 Radiculopathy, cervical region: Secondary | ICD-10-CM | POA: Diagnosis not present

## 2020-06-14 DIAGNOSIS — Z01818 Encounter for other preprocedural examination: Secondary | ICD-10-CM | POA: Diagnosis not present

## 2020-06-14 NOTE — Progress Notes (Signed)
Subjective:    Patient ID: Maria Bright, female    DOB: 04-Nov-1976, 44 y.o.   MRN: [id number]  HPI  Pt presents to the clinic today for presurgical clearance. She is scheduled to have C5-6 anterior discectomy with fusion by Dr. Sherlyn Lick on 06/24/20. She reports chronic neck pain that radiates into her BUE. She has no history of DM2, currently takes Ibuprofen as needed, no other blood thinners.  Review of Systems      Past Medical History:  Diagnosis Date  . ADHD (attention deficit hyperactivity disorder)   . Anxiety   . Complication of anesthesia    30 years ago ; woke up in middle of wisdom tooth extraction   . Depression   . GERD (gastroesophageal reflux disease)   . Hypertension   . Polycystic disease, ovaries    POC   . Seasonal allergies   . Sleep apnea    CPAP     Current Outpatient Medications  Medication Sig Dispense Refill  . albuterol (VENTOLIN HFA) 108 (90 Base) MCG/ACT inhaler Inhale 2 puffs into the lungs every 6 (six) hours as needed for wheezing or shortness of breath. 8 g 5  . buPROPion (WELLBUTRIN XL) 300 MG 24 hr tablet TAKE 1 TABLET BY MOUTH EVERY DAY 90 tablet 0  . cetirizine (ZYRTEC) 10 MG tablet Take 10 mg by mouth at bedtime.     Marland Kitchen desvenlafaxine (PRISTIQ) 100 MG 24 hr tablet TAKE 1 TABLET (100 MG TOTAL) BY MOUTH EVERY EVENING. 90 tablet 1  . diphenhydrAMINE (BENADRYL) 25 MG tablet Take 50 mg by mouth every 6 (six) hours as needed for itching or allergies.    . fluconazole (DIFLUCAN) 150 MG tablet Take 1 tablet (150 mg total) by mouth daily. May repeat in 72 hours if needed 2 tablet 0  . fluticasone (FLONASE) 50 MCG/ACT nasal spray Place 2 sprays into both nostrils daily. 16 g 6  . gabapentin (NEURONTIN) 300 MG capsule Take 300 mg by mouth. 1 capsule in the morning, 2 capsules at bedtime    . lisdexamfetamine (VYVANSE) 20 MG capsule Take 1 capsule (20 mg total) by mouth 2 (two) times daily. 60 capsule 0  . metaxalone (SKELAXIN) 800 MG tablet Take 1  tablet (800 mg total) by mouth daily as needed for muscle spasms. 30 tablet 0  . omeprazole (PRILOSEC OTC) 20 MG tablet Take 20 mg by mouth as needed.     . propranolol (INDERAL) 10 MG tablet Take 1 tablet (10 mg total) by mouth daily as needed (anxiety). Anxiety 30 tablet 2   No current facility-administered medications for this visit.    Allergies  Allergen Reactions  . Morphine And Related Other (See Comments)    Unknown   . Adhesive [Tape] Rash    blisters  . Nickel Rash    Family History  Problem Relation Age of Onset  . Arthritis Mother   . Hyperlipidemia Mother   . Heart disease Mother   . Hypertension Mother   . Rheum arthritis Maternal Aunt   . Hyperlipidemia Paternal Aunt   . Diabetes Paternal Aunt   . Rheum arthritis Maternal Grandmother   . Heart disease Maternal Grandmother   . Arthritis Maternal Grandfather   . Colon cancer Maternal Grandfather   . Hyperlipidemia Maternal Grandfather   . Heart disease Maternal Grandfather   . Hypertension Maternal Grandfather     Social History   Socioeconomic History  . Marital status: Married    Spouse name: Not on  file  . Number of children: Not on file  . Years of education: Not on file  . Highest education level: Not on file  Occupational History  . Not on file  Tobacco Use  . Smoking status: Never Smoker  . Smokeless tobacco: Never Used  Vaping Use  . Vaping Use: Never used  Substance and Sexual Activity  . Alcohol use: No    Comment: seldom   . Drug use: No  . Sexual activity: Yes    Birth control/protection: None  Other Topics Concern  . Not on file  Social History Narrative  . Not on file   Social Determinants of Health   Financial Resource Strain:   . Difficulty of Paying Living Expenses:   Food Insecurity:   . Worried About Charity fundraiser in the Last Year:   . Arboriculturist in the Last Year:   Transportation Needs:   . Film/video editor (Medical):   Marland Kitchen Lack of Transportation  (Non-Medical):   Physical Activity:   . Days of Exercise per Week:   . Minutes of Exercise per Session:   Stress:   . Feeling of Stress :   Social Connections:   . Frequency of Communication with Friends and Family:   . Frequency of Social Gatherings with Friends and Family:   . Attends Religious Services:   . Active Member of Clubs or Organizations:   . Attends Archivist Meetings:   Marland Kitchen Marital Status:   Intimate Partner Violence:   . Fear of Current or Ex-Partner:   . Emotionally Abused:   Marland Kitchen Physically Abused:   . Sexually Abused:      Constitutional: Denies fever, malaise, fatigue, headache or abrupt weight changes.  Respiratory: Denies difficulty breathing, shortness of breath, cough or sputum production.   Cardiovascular: Denies chest pain, chest tightness, palpitations or swelling in the hands or feet.  Gastrointestinal: Denies abdominal pain, bloating, constipation, diarrhea or blood in the stool.  GU: Denies urgency, frequency, pain with urination, burning sensation, blood in urine, odor or discharge. Musculoskeletal: Pt reports chronic neck and back pain. Denies difficulty with gait, muscle pain or joint swelling.  Skin: Denies redness, rashes, lesions or ulcercations.  Neurological: Denies dizziness, difficulty with memory, difficulty with speech or problems with balance and   No other specific complaints in a complete review of systems (except as listed in HPI above).  Objective:   Physical Exam BP 130/84   Pulse 74   Temp 97.8 F (36.6 C) (Temporal)   Wt (!) 313 lb (142 kg)   SpO2 98%   BMI 50.90 kg/m   Wt Readings from Last 3 Encounters:  02/15/20 (!) 308 lb (139.7 kg)  10/12/19 (!) 311 lb (141.1 kg)  09/15/18 (!) 307 lb 8 oz (139.5 kg)    General: Appears her stated age, obese, in NAD. HEENT: Head: normal shape and size; Eyes: EOMs intact;  Cardiovascular: Normal rate and rhythm. S1,S2 noted.  No murmur, rubs or gallops noted.    Pulmonary/Chest: Normal effort. Musculoskeletal:  No difficulty with gait.  Neurological: Alert and oriented. Coordination normal.    BMET    Component Value Date/Time   NA 134 (L) 02/15/2020 1514   K 4.3 02/15/2020 1514   CL 100 02/15/2020 1514   CO2 27 02/15/2020 1514   GLUCOSE 88 02/15/2020 1514   BUN 10 02/15/2020 1514   CREATININE 0.94 02/15/2020 1514   CALCIUM 9.3 02/15/2020 1514   GFRNONAA >60 06/21/2017  1553   GFRAA >60 06/21/2017 1553    Lipid Panel     Component Value Date/Time   CHOL 224 (H) 02/15/2020 1514   TRIG 96.0 02/15/2020 1514   HDL 59.30 02/15/2020 1514   CHOLHDL 4 02/15/2020 1514   VLDL 19.2 02/15/2020 1514   LDLCALC 145 (H) 02/15/2020 1514    CBC    Component Value Date/Time   WBC 8.0 02/15/2020 1514   RBC 4.82 02/15/2020 1514   HGB 14.5 02/15/2020 1514   HCT 43.1 02/15/2020 1514   PLT 260.0 02/15/2020 1514   MCV 89.3 02/15/2020 1514   MCH 29.5 06/04/2017 2253   MCHC 33.6 02/15/2020 1514   RDW 14.0 02/15/2020 1514   LYMPHSABS 2.7 06/04/2017 2253   MONOABS 0.9 06/04/2017 2253   EOSABS 0.4 06/04/2017 2253   BASOSABS 0.0 06/04/2017 2253    Hgb A1C Lab Results  Component Value Date   HGBA1C 5.4 02/15/2020           Assessment & Plan:   Preoperative Clearance:  Indication for ECG: preop clearance Interpretation of ECG: normal rate, normal rhythm Comparison of ECG: 05/2017, unchanged CBC, CMET, A1C and  PT/INR today  RTC as needed [name], NP This visit occurred during the SARS-CoV-2 public health emergency.  Safety protocols were in place, including screening questions prior to the visit, additional usage of staff PPE, and extensive cleaning of exam room while observing appropriate contact time as indicated for disinfecting solutions.

## 2020-06-15 LAB — COMPREHENSIVE METABOLIC PANEL
AG Ratio: 1.5 (calc) (ref 1.0–2.5)
ALT: 19 U/L (ref 6–29)
AST: 16 U/L (ref 10–30)
Albumin: 4 g/dL (ref 3.6–5.1)
Alkaline phosphatase (APISO): 82 U/L (ref 31–125)
BUN: 15 mg/dL (ref 7–25)
CO2: 26 mmol/L (ref 20–32)
Calcium: 9.1 mg/dL (ref 8.6–10.2)
Chloride: 105 mmol/L (ref 98–110)
Creat: 1.01 mg/dL (ref 0.50–1.10)
Globulin: 2.7 g/dL (calc) (ref 1.9–3.7)
Glucose, Bld: 82 mg/dL (ref 65–99)
Potassium: 4 mmol/L (ref 3.5–5.3)
Sodium: 141 mmol/L (ref 135–146)
Total Bilirubin: 0.3 mg/dL (ref 0.2–1.2)
Total Protein: 6.7 g/dL (ref 6.1–8.1)

## 2020-06-15 LAB — CBC
HCT: 39.5 % (ref 35.0–45.0)
Hemoglobin: 13.3 g/dL (ref 11.7–15.5)
MCH: 29.9 pg (ref 27.0–33.0)
MCHC: 33.7 g/dL (ref 32.0–36.0)
MCV: 88.8 fL (ref 80.0–100.0)
MPV: 10.9 fL (ref 7.5–12.5)
Platelets: 253 10*3/uL (ref 140–400)
RBC: 4.45 10*6/uL (ref 3.80–5.10)
RDW: 12.7 % (ref 11.0–15.0)
WBC: 6.4 10*3/uL (ref 3.8–10.8)

## 2020-06-15 LAB — HEMOGLOBIN A1C
Hgb A1c MFr Bld: 5.3 % of total Hgb (ref <5.7)
Mean Plasma Glucose: 105 (calc)
eAG (mmol/L): 5.8 (calc)

## 2020-06-15 LAB — PROTIME-INR
INR: 0.9
Prothrombin Time: 9.9 s (ref 9.0–11.5)

## 2020-06-17 ENCOUNTER — Encounter: Payer: Self-pay | Admitting: Internal Medicine

## 2020-06-17 NOTE — Patient Instructions (Signed)

## 2020-06-17 NOTE — Telephone Encounter (Signed)
Surgical clearance form faxed with attached labs, EKG and progress notes

## 2020-06-24 DIAGNOSIS — M4722 Other spondylosis with radiculopathy, cervical region: Secondary | ICD-10-CM | POA: Insufficient documentation

## 2020-06-24 HISTORY — PX: ANTERIOR CERVICAL DECOMP/DISCECTOMY FUSION: SHX1161

## 2020-06-26 ENCOUNTER — Emergency Department
Admission: EM | Admit: 2020-06-26 | Discharge: 2020-06-27 | Disposition: A | Discharge status: Other Acute Inpatient Hospital | Payer: BC Managed Care – PPO | Attending: Emergency Medicine | Admitting: Emergency Medicine

## 2020-06-26 ENCOUNTER — Emergency Department: Payer: BC Managed Care – PPO

## 2020-06-26 ENCOUNTER — Other Ambulatory Visit: Payer: Self-pay

## 2020-06-26 ENCOUNTER — Encounter: Payer: Self-pay | Admitting: Emergency Medicine

## 2020-06-26 DIAGNOSIS — I10 Essential (primary) hypertension: Secondary | ICD-10-CM | POA: Diagnosis not present

## 2020-06-26 DIAGNOSIS — F909 Attention-deficit hyperactivity disorder, unspecified type: Secondary | ICD-10-CM | POA: Diagnosis not present

## 2020-06-26 DIAGNOSIS — Z79899 Other long term (current) drug therapy: Secondary | ICD-10-CM | POA: Diagnosis not present

## 2020-06-26 DIAGNOSIS — Z20822 Contact with and (suspected) exposure to covid-19: Secondary | ICD-10-CM | POA: Diagnosis not present

## 2020-06-26 DIAGNOSIS — G8918 Other acute postprocedural pain: Secondary | ICD-10-CM | POA: Diagnosis present

## 2020-06-26 LAB — CBC WITH DIFFERENTIAL/PLATELET
Abs Immature Granulocytes: 0.03 10*3/uL (ref 0.00–0.07)
Basophils Absolute: 0.1 10*3/uL (ref 0.0–0.1)
Basophils Relative: 1 %
Eosinophils Absolute: 0.3 10*3/uL (ref 0.0–0.5)
Eosinophils Relative: 3 %
HCT: 39.2 % (ref 36.0–46.0)
Hemoglobin: 13 g/dL (ref 12.0–15.0)
Immature Granulocytes: 0 %
Lymphocytes Relative: 25 %
Lymphs Abs: 2.2 10*3/uL (ref 0.7–4.0)
MCH: 29.9 pg (ref 26.0–34.0)
MCHC: 33.2 g/dL (ref 30.0–36.0)
MCV: 90.1 fL (ref 80.0–100.0)
Monocytes Absolute: 1.1 10*3/uL — ABNORMAL HIGH (ref 0.1–1.0)
Monocytes Relative: 12 %
Neutro Abs: 5.4 10*3/uL (ref 1.7–7.7)
Neutrophils Relative %: 59 %
Platelets: 233 10*3/uL (ref 150–400)
RBC: 4.35 MIL/uL (ref 3.87–5.11)
RDW: 13.4 % (ref 11.5–15.5)
WBC: 9.1 10*3/uL (ref 4.0–10.5)
nRBC: 0 % (ref 0.0–0.2)

## 2020-06-26 LAB — BASIC METABOLIC PANEL
Anion gap: 9 (ref 5–15)
BUN: 14 mg/dL (ref 6–20)
CO2: 26 mmol/L (ref 22–32)
Calcium: 8.7 mg/dL — ABNORMAL LOW (ref 8.9–10.3)
Chloride: 102 mmol/L (ref 98–111)
Creatinine, Ser: 1.04 mg/dL — ABNORMAL HIGH (ref 0.44–1.00)
GFR calc Af Amer: >60 mL/min (ref >60)
GFR calc non Af Amer: >60 mL/min (ref >60)
Glucose, Bld: 105 mg/dL — ABNORMAL HIGH (ref 70–99)
Potassium: 5 mmol/L (ref 3.5–5.1)
Sodium: 137 mmol/L (ref 135–145)

## 2020-06-26 MED ORDER — IOHEXOL 300 MG/ML  SOLN
75.0000 mL | Freq: Once | INTRAMUSCULAR | Status: AC | PRN
  Administered 2020-06-26: 75 mL via INTRAVENOUS

## 2020-06-26 MED ORDER — SODIUM CHLORIDE 0.9 % IV BOLUS
1000.0000 mL | Freq: Once | INTRAVENOUS | Status: AC
  Administered 2020-06-27: 1000 mL via INTRAVENOUS

## 2020-06-26 MED ORDER — KETOROLAC TROMETHAMINE 30 MG/ML IJ SOLN
15.0000 mg | Freq: Once | INTRAMUSCULAR | Status: AC
  Administered 2020-06-26: 15 mg via INTRAVENOUS
  Filled 2020-06-26: qty 1

## 2020-06-26 NOTE — ED Triage Notes (Signed)
Pt presents to ED via POV with c/o post-op problem. Pt states had spinal fusion at C5-C6. Pt states since then has had progressively harder and harder time swallowing, states is now having difficulty maintaining her own secretions. Pt states frontal approach to spinal fusion.

## 2020-06-26 NOTE — ED Notes (Signed)
Pt sitting in lobby in recliner, tearful; pt reports surgical site pain and difficulty swallowing; st has been unable to swallow her PO meds for last 8hrs; pt updated on wait time; discussed pt's cc and results with Dr Clayton Bibles and order obtained for pain meds; pt given warm blanket & pillow for comfort; call bell within reach

## 2020-06-26 NOTE — ED Provider Notes (Signed)
[facility] Emergency Department Provider Note    ____________________________________________   First MD Initiated Contact with Patient 06/26/20 2335     (approximate)  I have reviewed the triage vital signs and the nursing notes.   HISTORY  Chief Complaint Post-op Problem     HPI Maria Bright is a 44 y.o. female who had surgery on her neck on Monday.  She had a CT 5 6 anterior fusion.  Patient reports increasing pain and difficulty swallowing.  This morning she could barely swallow any water at all so she came into the hospital this afternoon had to wait a long time before she was able to be seen.  She got some Toradol and is now able to swallow water better.  Pain with swallowing water is still severe.         Past Medical History:  Diagnosis Date  . ADHD (attention deficit hyperactivity disorder)   . Anxiety   . Complication of anesthesia    30 years ago ; woke up in middle of wisdom tooth extraction   . Depression   . GERD (gastroesophageal reflux disease)   . Hypertension   . Polycystic disease, ovaries    POC   . Seasonal allergies   . Sleep apnea    CPAP     Patient Active Problem List   Diagnosis Date Noted  . Allergy-induced asthma 02/26/2020  . 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 Anxiety and depression 06/07/2017  . ADHD 06/07/2017  . C6 radiculopathy 06/07/2017  . GERD (gastroesophageal reflux disease) 06/07/2017  . HTN (hypertension) 06/07/2017  . PCOS (polycystic ovarian syndrome) 06/07/2017  . Seasonal allergies 06/07/2017    Past Surgical History:  Procedure Laterality Date  . CHOLECYSTECTOMY N/A 06/30/2017   Procedure: LAPAROSCOPIC CHOLECYSTECTOMY;  Surgeon: Kinsinger, Arta Bruce, MD;  Location: [facility] ORS;  Service: General;  Laterality: N/A;  . WISDOM TOOTH EXTRACTION      Prior to Admission medications   Medication Sig Start Date End Date Taking? Authorizing Provider  albuterol  (VENTOLIN HFA) 108 (90 Base) MCG/ACT inhaler Inhale 2 puffs into the lungs every 6 (six) hours as needed for wheezing or shortness of breath. 02/26/20   Jearld Fenton, NP  buPROPion (WELLBUTRIN XL) 300 MG 24 hr tablet TAKE 1 TABLET BY MOUTH EVERY DAY 06/04/20   Jearld Fenton, NP  cetirizine (ZYRTEC) 10 MG tablet Take 10 mg by mouth at bedtime.     [provider]  desvenlafaxine (PRISTIQ) 100 MG 24 hr tablet TAKE 1 TABLET (100 MG TOTAL) BY MOUTH EVERY EVENING. 04/16/20   Jearld Fenton, NP  diphenhydrAMINE (BENADRYL) 25 MG tablet Take 50 mg by mouth every 6 (six) hours as needed for itching or allergies.    [provider]  fluconazole (DIFLUCAN) 150 MG tablet Take 1 tablet (150 mg total) by mouth daily. May repeat in 72 hours if needed 03/22/20   Hall-Potvin, [place], PA-C  fluticasone (FLONASE) 50 MCG/ACT nasal spray Place 2 sprays into both nostrils daily. 02/26/20   Jearld Fenton, NP  gabapentin (NEURONTIN) 300 MG capsule Take 300 mg by mouth. 1 capsule in the morning, 2 capsules at bedtime    [provider]  lisdexamfetamine (VYVANSE) 20 MG capsule Take 1 capsule (20 mg total) by mouth 2 (two) times daily. 06/11/20   Jearld Fenton, NP  metaxalone (SKELAXIN) 800 MG tablet Take 1 tablet (800 mg total) by mouth daily as needed for muscle  spasms. 02/26/20   Jearld Fenton, NP  omeprazole (PRILOSEC OTC) 20 MG tablet Take 20 mg by mouth as needed.     [provider]  propranolol (INDERAL) 10 MG tablet Take 1 tablet (10 mg total) by mouth daily as needed (anxiety). Anxiety 04/16/20   Jearld Fenton, NP    Allergies Morphine and related, Adhesive [tape], and Nickel  Family History  Problem Relation Age of Onset  . Arthritis Mother   . Hyperlipidemia Mother   . Heart disease Mother   . Hypertension Mother   . Rheum arthritis Maternal Aunt   . Hyperlipidemia Paternal Aunt   . Diabetes Paternal Aunt   . Rheum arthritis Maternal Grandmother   . Heart  disease Maternal Grandmother   . Arthritis Maternal Grandfather   . Colon cancer Maternal Grandfather   . Hyperlipidemia Maternal Grandfather   . Heart disease Maternal Grandfather   . Hypertension Maternal Grandfather     Social History Social History   Tobacco Use  . Smoking status: Never Smoker  . Smokeless tobacco: Never Used  Vaping Use  . Vaping Use: Never used  Substance Use Topics  . Alcohol use: No    Comment: seldom   . Drug use: No    Review of Systems  Constitutional: No fever/chills Eyes: No visual changes. ENT: Pain with swallowing from her surgery Cardiovascular: Denies chest pain. Respiratory: Denies shortness of breath. Gastrointestinal: No abdominal pain.  No nausea, no vomiting.  No diarrhea.  No constipation. Genitourinary: Negative for dysuria. Musculoskeletal: Negative for back pain. Skin: Negative for rash. Neurological: Negative for headaches, focal weakness  ____________________________________________   PHYSICAL EXAM:  VITAL SIGNS: ED Triage Vitals [06/26/20 1649]  Enc Vitals Group     BP (!) 146/88     Pulse Rate 104     Resp 20     Temp 99.8 F (37.7 C)     Temp Source Oral     SpO2 100 %     Weight (!) 313 lb (142 kg)     Height 5' 7.5" (1.715 m)     Head Circumference      Peak Flow      Pain Score 9     Pain Loc      Pain Edu?      Excl. in [place]?     Constitutional: Alert and oriented.  Holding her neck stiffly. Eyes: Conjunctivae are normal. Head: Atraumatic. Nose: No congestion/rhinnorhea. Mouth/Throat: Mucous membranes are moist.  Oropharynx non-erythematous. Neck: No stridor.  Surgical site does not look infected.  Cardiovascular: Normal rate, regular rhythm. Grossly normal heart sounds.  Good peripheral circulation. Respiratory: Normal respiratory effort.  No retractions. Lungs CTAB. Gastrointestinal: Soft and nontender. No distention. Musculoskeletal: No lower extremity tenderness nor edema.   Neurologic:  Normal  speech and language. No gross focal neurologic deficits are appreciated.  Skin:  Skin is warm, dry and intact. No rash noted.   ____________________________________________   LABS (all labs ordered are listed, but only abnormal results are displayed)  Labs Reviewed  CBC WITH DIFFERENTIAL/PLATELET - Abnormal; Notable for the following components:      Result Value   Monocytes Absolute 1.1 (*)    All other components within normal limits  BASIC METABOLIC PANEL - Abnormal; Notable for the following components:   Glucose, Bld 105 (*)    Creatinine, Ser 1.04 (*)    Calcium 8.7 (*)    All other components within normal limits   ____________________________________________  EKG   ____________________________________________  RADIOLOGY  ED MD interpretation: CT read by radiology is changes most consistent with recent surgery no sign of inflammation  Official radiology report(s): CT Soft Tissue Neck W Contrast  Result Date: 06/26/2020 CLINICAL DATA:  C5-6 spinal fusion two days ago. Progressive worsening swallowing difficulty. EXAM: CT NECK WITH CONTRAST TECHNIQUE: Multidetector CT imaging of the neck was performed using the standard protocol following the bolus administration of intravenous contrast. CONTRAST:  36mL OMNIPAQUE IOHEXOL 300 MG/ML  SOLN COMPARISON:  Operative and perioperative imaging 06/24/2020 and 06/25/2020. FINDINGS: Pharynx and larynx: Patient has head recent anterior cervical discectomy and fusion at the C5-6 level from a left approach. There is mild edema and a few air bubbles along the operative approach. No evidence of superficial hematoma. There is mild soft tissue swelling in the retropharyngeal space with a small amount of fluid, typical at postop day 2 by gross appearance. There is no evidence of a loculated collection or any collection that would appear to represent a focal hematoma or abscess. Salivary glands: Parotid and submandibular glands are normal. Thyroid:  Normal Lymph nodes: Normal Vascular: Normal Limited intracranial: Normal Visualized orbits: Normal Mastoids and visualized paranasal sinuses: Clear Skeleton: Otherwise normal Upper chest: Normal Other: None IMPRESSION: Recent ACDF at C5-6 from a left-sided approach. Hardware appears well position. There is what I would consider to be a normal amount mild edema and residual air bubbles along the operative approach. There is what I would consider to be a normal amount of prevertebral soft tissue swelling and edema in the retropharyngeal space. There does not appear to be a large focal hematoma or a localized or loculated collection. Electronically Signed   By: Nelson Chimes M.D.   On: 06/26/2020 18:28    ____________________________________________   PROCEDURES  Procedure(s) performed (including Critical Care):  Procedures   ____________________________________________   INITIAL IMPRESSION / ASSESSMENT AND PLAN / ED COURSE  Patient's CBC differential lab work looks normal we will try and get a hold of neurosurgery at [facility]. ----------------------------------------- 12:36 AM on 06/27/2020 -----------------------------------------  Mina Marble reports that orthopedics spine did her surgery at [facility].  I talked to Dr. Octavio Manns, orthopedics at [facility] who will accept her in transfer to [facility] ER.  I spoke with Dr. Caryn Section at [facility] ER as well.  The patient is still having a lot of pain and difficulty swallowing even sips of water.  Additionally we tried to get her to take one of her oxycodones and that is now stuck in the area of the incision.  It has not gone down in spite of forcing herself to take multiple sips of water.             ____________________________________________   FINAL CLINICAL IMPRESSION(S) / ED DIAGNOSES  Final diagnoses:  Post-operative pain   No actual discharges difficulty swallowing solids or liquids after surgery but of course this is not in the  list.  ED Discharge Orders    None       Note:  This document was prepared using Dragon voice recognition software and may include unintentional dictation errors.    Nena Polio, MD 06/27/20 [phone]

## 2020-06-27 ENCOUNTER — Encounter: Payer: Self-pay | Admitting: Internal Medicine

## 2020-06-27 ENCOUNTER — Ambulatory Visit (HOSPITAL_COMMUNITY)
Admission: AD | Admit: 2020-06-27 | Discharge: 2020-06-27 | Disposition: A | Discharge status: Home / Self Care | Payer: BC Managed Care – PPO | Source: Other Acute Inpatient Hospital | Attending: Emergency Medicine | Admitting: Emergency Medicine

## 2020-06-27 DIAGNOSIS — Z7689 Persons encountering health services in other specified circumstances: Secondary | ICD-10-CM | POA: Insufficient documentation

## 2020-06-27 DIAGNOSIS — G8918 Other acute postprocedural pain: Secondary | ICD-10-CM | POA: Diagnosis present

## 2020-06-27 LAB — SARS CORONAVIRUS 2 BY RT PCR (HOSPITAL ORDER, PERFORMED IN ~~LOC~~ HOSPITAL LAB): SARS Coronavirus 2: NEGATIVE

## 2020-06-27 MED ORDER — DESVENLAFAXINE SUCCINATE ER 50 MG PO TB24
100.0000 mg | ORAL_TABLET | Freq: Every day | ORAL | 0 refills | Status: DC
Start: 2020-06-27 — End: 2020-10-03

## 2020-06-27 NOTE — ED Notes (Signed)
[facility] to ED to transport pt to [facility]

## 2020-07-06 DIAGNOSIS — I10 Essential (primary) hypertension: Secondary | ICD-10-CM | POA: Insufficient documentation

## 2020-07-06 DIAGNOSIS — J45909 Unspecified asthma, uncomplicated: Secondary | ICD-10-CM | POA: Insufficient documentation

## 2020-07-06 DIAGNOSIS — Z79899 Other long term (current) drug therapy: Secondary | ICD-10-CM | POA: Insufficient documentation

## 2020-07-06 DIAGNOSIS — M79604 Pain in right leg: Secondary | ICD-10-CM | POA: Diagnosis present

## 2020-07-06 DIAGNOSIS — I82451 Acute embolism and thrombosis of right peroneal vein: Secondary | ICD-10-CM | POA: Diagnosis not present

## 2020-07-06 DIAGNOSIS — I82401 Acute embolism and thrombosis of unspecified deep veins of right lower extremity: Secondary | ICD-10-CM

## 2020-07-06 DIAGNOSIS — Z7951 Long term (current) use of inhaled steroids: Secondary | ICD-10-CM | POA: Insufficient documentation

## 2020-07-06 HISTORY — DX: Acute embolism and thrombosis of unspecified deep veins of right lower extremity: I82.401

## 2020-07-07 ENCOUNTER — Other Ambulatory Visit: Payer: Self-pay

## 2020-07-07 ENCOUNTER — Encounter (HOSPITAL_COMMUNITY): Payer: Self-pay | Admitting: *Deleted

## 2020-07-07 ENCOUNTER — Emergency Department (HOSPITAL_COMMUNITY)
Admission: EM | Admit: 2020-07-07 | Discharge: 2020-07-07 | Disposition: A | Discharge status: Home / Self Care | Payer: BC Managed Care – PPO | Attending: Emergency Medicine | Admitting: Emergency Medicine

## 2020-07-07 ENCOUNTER — Emergency Department (HOSPITAL_BASED_OUTPATIENT_CLINIC_OR_DEPARTMENT_OTHER): Payer: BC Managed Care – PPO

## 2020-07-07 DIAGNOSIS — M79609 Pain in unspecified limb: Secondary | ICD-10-CM | POA: Diagnosis not present

## 2020-07-07 DIAGNOSIS — I82451 Acute embolism and thrombosis of right peroneal vein: Secondary | ICD-10-CM

## 2020-07-07 LAB — I-STAT CHEM 8, ED
BUN: 12 mg/dL (ref 6–20)
Calcium, Ion: 1.08 mmol/L — ABNORMAL LOW (ref 1.15–1.40)
Chloride: 104 mmol/L (ref 98–111)
Creatinine, Ser: 0.9 mg/dL (ref 0.44–1.00)
Glucose, Bld: 114 mg/dL — ABNORMAL HIGH (ref 70–99)
HCT: 40 % (ref 36.0–46.0)
Hemoglobin: 13.6 g/dL (ref 12.0–15.0)
Potassium: 3.6 mmol/L (ref 3.5–5.1)
Sodium: 137 mmol/L (ref 135–145)
TCO2: 22 mmol/L (ref 22–32)

## 2020-07-07 MED ORDER — RIVAROXABAN 15 MG PO TABS
15.0000 mg | ORAL_TABLET | Freq: Once | ORAL | Status: DC
  Filled 2020-07-07: qty 1

## 2020-07-07 MED ORDER — RIVAROXABAN (XARELTO) VTE STARTER PACK (15 & 20 MG)
ORAL_TABLET | ORAL | 0 refills | Status: DC
Start: 2020-07-07 — End: 2020-11-12

## 2020-07-07 MED ORDER — RIVAROXABAN (XARELTO) EDUCATION KIT FOR DVT/PE PATIENTS
PACK | Freq: Once | Status: DC
  Filled 2020-07-07: qty 1

## 2020-07-07 NOTE — Progress Notes (Signed)
VASCULAR LAB   Right lower extremity venous duplex completed.    Preliminary report:  See CV proc for preliminary results.  Called Domenic Moras, PA-C with report  Deauna Yaw, RVT 07/07/2020, 10:50 AM

## 2020-07-07 NOTE — ED Triage Notes (Signed)
Pt states that she had spinal fusion surgery n 7/26 and then on Thursday started having right leg cramping and it has continued.  No swelling to LE.  Palpated and dopplered RLE DPP.

## 2020-07-07 NOTE — ED Provider Notes (Signed)
[facility] EMERGENCY DEPARTMENT Provider Note   CSN: [id number] Arrival date & time: 07/06/20  2312     History Chief Complaint  Patient presents with  . Leg Pain    Maria Bright is a 44 y.o. female.  The history is provided by the patient and medical records. No language interpreter was used.  Leg Pain    44 year old female with significant history of polycystic ovarian disease, ADHD, anxiety, depression, presenting complaining of right leg pain.  Patient report for the past 3 to 4 days she has noticed pain and spasm about her right lower leg specifically her right calf.  Pain described as "charley horse" sensation that has been waxing waning but presents both with exertion and at rest.  Pain is moderate in severity rates as 5 out of 10.  Pain is mild improved with pain medication that she is currently taking.  No associated fever or chills no chest pain or shortness of breath and no back pain.  Patient had a spinal fusion surgery on 7/26.  She also report taking oral birth control pills.  She denies any prior history of PE or DVT.  No history of malignancy.  She denies any leg injury.  Past Medical History:  Diagnosis Date  . ADHD (attention deficit hyperactivity disorder)   . Anxiety   . Complication of anesthesia    30 years ago ; woke up in middle of wisdom tooth extraction   . Depression   . GERD (gastroesophageal reflux disease)   . Hypertension   . Polycystic disease, ovaries    POC   . Seasonal allergies   . Sleep apnea    CPAP     Patient Active Problem List   Diagnosis Date Noted  . Allergy-induced asthma 02/26/2020  . 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 Anxiety and depression 06/07/2017  . ADHD 06/07/2017  . C6 radiculopathy 06/07/2017  . GERD (gastroesophageal reflux disease) 06/07/2017  . HTN (hypertension) 06/07/2017  . PCOS (polycystic ovarian syndrome) 06/07/2017  . Seasonal allergies 06/07/2017    Past  Surgical History:  Procedure Laterality Date  . BACK SURGERY    . CHOLECYSTECTOMY N/A 06/30/2017   Procedure: LAPAROSCOPIC CHOLECYSTECTOMY;  Surgeon: Kinsinger, Arta Bruce, MD;  Location: [facility];  Service: General;  Laterality: N/A;  . WISDOM TOOTH EXTRACTION       OB History   No obstetric history on file.     Family History  Problem Relation Age of Onset  . Arthritis Mother   . Hyperlipidemia Mother   . Heart disease Mother   . Hypertension Mother   . Rheum arthritis Maternal Aunt   . Hyperlipidemia Paternal Aunt   . Diabetes Paternal Aunt   . Rheum arthritis Maternal Grandmother   . Heart disease Maternal Grandmother   . Arthritis Maternal Grandfather   . Colon cancer Maternal Grandfather   . Hyperlipidemia Maternal Grandfather   . Heart disease Maternal Grandfather   . Hypertension Maternal Grandfather     Social History   Tobacco Use  . Smoking status: Never Smoker  . Smokeless tobacco: Never Used  Vaping Use  . Vaping Use: Never used  Substance Use Topics  . Alcohol use: No    Comment: seldom   . Drug use: No    Home Medications Prior to Admission medications   Medication Sig Start Date End Date Taking? Authorizing Provider  albuterol (VENTOLIN HFA) 108 (90 Base) MCG/ACT inhaler Inhale 2 puffs into the  lungs every 6 (six) hours as needed for wheezing or shortness of breath. 02/26/20   Jearld Fenton, NP  buPROPion (WELLBUTRIN XL) 300 MG 24 hr tablet TAKE 1 TABLET BY MOUTH EVERY DAY 06/04/20   Jearld Fenton, NP  cetirizine (ZYRTEC) 10 MG tablet Take 10 mg by mouth at bedtime.     [provider]  desvenlafaxine (PRISTIQ) 100 MG 24 hr tablet TAKE 1 TABLET (100 MG TOTAL) BY MOUTH EVERY EVENING. 04/16/20   Jearld Fenton, NP  desvenlafaxine (PRISTIQ) 50 MG 24 hr tablet Take 2 tablets (100 mg total) by mouth daily. 06/27/20   Jearld Fenton, NP  diphenhydrAMINE (BENADRYL) 25 MG tablet Take 50 mg by mouth every 6 (six) hours as needed for itching or  allergies.    [provider]  fluconazole (DIFLUCAN) 150 MG tablet Take 1 tablet (150 mg total) by mouth daily. May repeat in 72 hours if needed 03/22/20   Hall-Potvin, [place], PA-C  fluticasone (FLONASE) 50 MCG/ACT nasal spray Place 2 sprays into both nostrils daily. 02/26/20   Jearld Fenton, NP  gabapentin (NEURONTIN) 300 MG capsule Take 300 mg by mouth. 1 capsule in the morning, 2 capsules at bedtime    [provider]  lisdexamfetamine (VYVANSE) 20 MG capsule Take 1 capsule (20 mg total) by mouth 2 (two) times daily. 06/11/20   Jearld Fenton, NP  metaxalone (SKELAXIN) 800 MG tablet Take 1 tablet (800 mg total) by mouth daily as needed for muscle spasms. 02/26/20   Jearld Fenton, NP  omeprazole (PRILOSEC OTC) 20 MG tablet Take 20 mg by mouth as needed.     [provider]  propranolol (INDERAL) 10 MG tablet Take 1 tablet (10 mg total) by mouth daily as needed (anxiety). Anxiety 04/16/20   Jearld Fenton, NP    Allergies    Morphine and related, Adhesive [tape], and Nickel  Review of Systems   Review of Systems  All other systems reviewed and are negative.   Physical Exam Updated Vital Signs BP (!) 133/114 (BP Location: Right Arm)   Pulse 93   Temp 98 F (36.7 C)   Resp 18   SpO2 100%   Physical Exam Vitals and nursing note reviewed.  Constitutional:      General: She is not in acute distress.    Appearance: She is well-developed. She is obese.  HENT:     Head: Atraumatic.  Eyes:     Conjunctiva/sclera: Conjunctivae normal.  Cardiovascular:     Rate and Rhythm: Normal rate and regular rhythm.     Pulses: Normal pulses.     Heart sounds: Normal heart sounds.  Pulmonary:     Effort: Pulmonary effort is normal.     Breath sounds: Normal breath sounds. No wheezing, rhonchi or rales.  Abdominal:     Palpations: Abdomen is soft.     Tenderness: There is no abdominal tenderness.  Musculoskeletal:        General: Tenderness (Right lower  extremity: Tenderness to posterior calf on palpation.  No obvious peripheral edema appreciated.  Dorsalis pedis pulse palpable with brisk cap refill.  No overlying skin changes.  No deformity noted.) present.     Cervical back: Neck supple.  Skin:    Findings: No rash.  Neurological:     Mental Status: She is alert.     ED Results / Procedures / Treatments   Labs (all labs ordered are listed, but only abnormal results are displayed)  Labs Reviewed  I-STAT CHEM 8, ED - Abnormal; Notable for the following components:      Result Value   Glucose, Bld 114 (*)    Calcium, Ion 1.08 (*)    All other components within normal limits    EKG None  Radiology No results found.  Procedures Procedures (including critical care time)  Medications Ordered in ED Medications  Rivaroxaban (XARELTO) tablet 15 mg (has no administration in time range)  rivaroxaban (XARELTO) Education Kit for DVT/PE patients (has no administration in time range)    ED Course  I have reviewed the triage vital signs and the nursing notes.  Pertinent labs & imaging results that were available during my care of the patient were reviewed by me and considered in my medical decision making (see chart for details).    MDM Rules/Calculators/A&P                          BP (!) 133/114 (BP Location: Right Arm)   Pulse 93   Temp 98 F (36.7 C)   Resp 18   SpO2 100%   Final Clinical Impression(s) / ED Diagnoses Final diagnoses:  Acute deep vein thrombosis (DVT) of right peroneal vein ([place])    Rx / DC Orders ED Discharge Orders    None     9:11 AM Patient here with complaints of atraumatic pain to her right lower extremity about her calf for the past several days.  Due to recent surgical history including spinal fusion over a week ago as well as obesity, history of PCOS, currently on oral birth control, it would be beneficial to obtain a venous Doppler study to rule out DVT of the right lower extremity.  No  evidence of infectious etiology and no recent trauma.  She is neurovascularly intact.  10:57 AM Venous Doppler ultrasound of the right lower extremity did confirm evidence of an acute DVT at the tibial/peroneal region.  Will initiate anticoagulant, Xarelto as treatment.  Patient provided educational kit with appropriate outpatient follow-up.  Renal function is within normal limit.  Return precaution discussed.  Encourage patient to follow-up with PCP for further evaluation and management of her DVT.   Pt does not have any sob and hypoxia, doubt PE.     Domenic Moras, PA-C 07/07/20 1109    Long, Wonda Olds, MD 07/07/20 [phone]

## 2020-07-07 NOTE — Discharge Instructions (Addendum)
You have been diagnosed with a blood clot in your right leg in the deep vein.  Please start taking Xarelto blood thinner medication as prescribed.  Call and follow-up closely with your primary care doctor for further management.  Avoid taking anti-inflammatory medication while taking blood thinner medication.  Return to the ER if you develop shortness of breath, chest pain or if you have any other concerns.

## 2020-07-07 NOTE — ED Notes (Signed)
Recent (2 weeks ago) [place], anterior [place], has had cramping in right leg started a couple days ago- cramping in calf now radiating to thigh.

## 2020-07-08 ENCOUNTER — Encounter: Payer: Self-pay | Admitting: Internal Medicine

## 2020-07-08 DIAGNOSIS — I82451 Acute embolism and thrombosis of right peroneal vein: Secondary | ICD-10-CM

## 2020-07-09 NOTE — Telephone Encounter (Signed)
Referral will be placed at hospital followup.

## 2020-07-09 NOTE — Telephone Encounter (Signed)
Pt called asking to have an urgent referral placed to see Dr Trula Slade or any other provider at [facility] in [place]. She is having severe leg pain. They said she needs a referral.   Call pt [phone]. Please let pt know when referral is sent.

## 2020-07-10 ENCOUNTER — Telehealth: Payer: Self-pay | Admitting: *Deleted

## 2020-07-10 NOTE — Telephone Encounter (Signed)
Spoke with patient regarding her referral to vein specialist. We were unable to get th patient in with [facility] quickly, the soonest appt was Jul 30, 2020.  Maria Bright from [facility] reached out to me stating that the patient had called and canceled her appt for 8/31 - stating that she got in somewhere sooner.   Called and spoke with patient and she states she was able to get herself scheduled with [name] - Dr. Renaldo Reel on 07/11/20 at 9:00AM. (We have sent referral notes to his office)  Patient requested to speak with office manager. Patient states that she does not feel that this process was handled appropriately and she does not feel like she received good patient care or communication. See MyChart encounter on 07/08/20.   I offered to let her speak with [name], CMA - but she stated she would much rather speak with office manager.

## 2020-07-10 NOTE — Telephone Encounter (Signed)
I received a phone call back from [place], referral coordinator with [facility]. They are unable to get patient in until about 2-3 weeks from now. [name], is this ok?

## 2020-07-10 NOTE — Telephone Encounter (Signed)
Disregard previous message.  Patient is being seen 07/11/20 at 9:00 at [facility] - Dr. Renaldo Reel

## 2020-07-12 NOTE — Telephone Encounter (Addendum)
Contacted patient to discuss her concerns.    Patient was very calm and pleasant but feeling sad and disappointed. She reports that she was in the ER over the prior weekend and diagnosed with a DVT.  This was very scary to her and did not feel that they gave her all information upon her discharge as to how to manage at home until follow up with PCP.  In addition, she was in a lot of pain after discharge and did not know whether she could apply heat/ice or what she should be taking to manage her pain.   She began my chart messaging [name], NP and [name] to ask to get in as soon as possible for the hospital follow up as she had a lot of questions, was in pain and afraid.  She felt that her concerns were not being addressed and she felt "alone and that no one cared about what she was going through".  She goes on to state that she loves [name] but did not understand what was happening or why it was taking her so long to get an appointment and a referral to follow up with vascular as this was a very concerning matter to her.  Patient was made aware that [name] was out of the office most of that week.  She says she would have been happy to have seen someone else but that was never offered to her.   Patient and I discussed that with the concerns and symptoms she was having, especially if she felt the pain was urgent, then a phone message would have been better.  Tone and level of concern cannot always be picked up on in an email type message and certainly my chart is not designed for urgent concerns.  But, I am sorry that she felt alone as that is never any of our intentions and I am sure her care team of [name] would never want their patients to feel that way.  I have asked that she call in to our office for urgent needs moving further.    I told her that I would make [name] and [name] aware as I am sure they would never intend for their conversations to be perceived as  uncaring or unfeeling.  She thanked me for the conversation and looks forward to follow ing up with [name] next week at her hospital follow up.  Also, she does see vascular in the morning (which was an appt she had to get on her own because of the delay in hearing back from [name]) as she is still needing instructions on how to care for her leg and how to best manage the pain moving forward.    If she has any questions or concerns moving forward, patient is aware that she can call me.

## 2020-07-16 ENCOUNTER — Telehealth: Payer: Self-pay | Admitting: Internal Medicine

## 2020-07-16 ENCOUNTER — Ambulatory Visit: Payer: BC Managed Care – PPO | Admitting: Internal Medicine

## 2020-07-16 ENCOUNTER — Other Ambulatory Visit: Payer: Self-pay

## 2020-07-16 ENCOUNTER — Encounter: Payer: Self-pay | Admitting: Internal Medicine

## 2020-07-16 VITALS — BP 136/84 | HR 104 | Temp 97.7°F | Ht 65.75 in | Wt 312.0 lb

## 2020-07-16 DIAGNOSIS — Z981 Arthrodesis status: Secondary | ICD-10-CM | POA: Diagnosis not present

## 2020-07-16 DIAGNOSIS — R1310 Dysphagia, unspecified: Secondary | ICD-10-CM

## 2020-07-16 DIAGNOSIS — I82451 Acute embolism and thrombosis of right peroneal vein: Secondary | ICD-10-CM

## 2020-07-16 NOTE — Progress Notes (Signed)
.  rwb

## 2020-07-16 NOTE — Patient Instructions (Signed)

## 2020-07-16 NOTE — Telephone Encounter (Signed)
After evaluating her this morning, there is no indication for a handicap placard.

## 2020-07-16 NOTE — Progress Notes (Signed)
Subjective:    Patient ID: Maria Bright, female    DOB: [birth year], 44 y.o.   MRN: [id number]  HPI  Patient presents the clinic today for hospital follow-up.  She had a C5-C6 anterior discectomy with fusion on 06/24/2020.  She presented to the ER 06/26/2020 with complaint of worsening postoperative pain.  CT soft tissue of the neck showed mild swelling but otherwise intact hardware.  She was transferred to [facility] ER for further evaluation.  She was treated with Toradol, fentanyl and Zofran.  She was discharged home advised to follow-up with her neurosurgeon.  She presented back to the ER 07/07/2020 with complaint of right lower extremity pain and swelling.  Venous ultrasound right lower extremity showed DVT in the right tibial/peroneal vein.  She was started on Xarelto.  She was discharged and follows to follow-up with neurosurgery and PCP.  Since discharge, she has already seen the vascular specialist. She reports Dr. Renaldo Reel, they gave compression hose. She continues her Xarelto and has a follow up appt them scheduled. She has not seen neurosurgery but has an appt scheduled for week. She reports her pain is moderate, managed with Oxycodone as needed. She has not started physical therapy. She reports the pain in the right leg is improving.   Review of Systems  Past Medical History:  Diagnosis Date  . ADHD (attention deficit hyperactivity disorder)   . Anxiety   . Complication of anesthesia    30 years ago ; woke up in middle of wisdom tooth extraction   . Depression   . GERD (gastroesophageal reflux disease)   . Hypertension   . Polycystic disease, ovaries    POC   . Seasonal allergies   . Sleep apnea    CPAP     Current Outpatient Medications  Medication Sig Dispense Refill  . albuterol (VENTOLIN HFA) 108 (90 Base) MCG/ACT inhaler Inhale 2 puffs into the lungs every 6 (six) hours as needed for wheezing or shortness of breath. 8 g 5  . buPROPion (WELLBUTRIN XL) 300 MG 24 hr  tablet TAKE 1 TABLET BY MOUTH EVERY DAY 90 tablet 0  . cetirizine (ZYRTEC) 10 MG tablet Take 10 mg by mouth at bedtime.     Marland Kitchen desvenlafaxine (PRISTIQ) 100 MG 24 hr tablet TAKE 1 TABLET (100 MG TOTAL) BY MOUTH EVERY EVENING. 90 tablet 1  . desvenlafaxine (PRISTIQ) 50 MG 24 hr tablet Take 2 tablets (100 mg total) by mouth daily. 14 tablet 0  . diphenhydrAMINE (BENADRYL) 25 MG tablet Take 50 mg by mouth every 6 (six) hours as needed for itching or allergies.    . fluconazole (DIFLUCAN) 150 MG tablet Take 1 tablet (150 mg total) by mouth daily. May repeat in 72 hours if needed 2 tablet 0  . fluticasone (FLONASE) 50 MCG/ACT nasal spray Place 2 sprays into both nostrils daily. 16 g 6  . gabapentin (NEURONTIN) 300 MG capsule Take 300 mg by mouth. 1 capsule in the morning, 2 capsules at bedtime    . lisdexamfetamine (VYVANSE) 20 MG capsule Take 1 capsule (20 mg total) by mouth 2 (two) times daily. 60 capsule 0  . metaxalone (SKELAXIN) 800 MG tablet Take 1 tablet (800 mg total) by mouth daily as needed for muscle spasms. 30 tablet 0  . omeprazole (PRILOSEC OTC) 20 MG tablet Take 20 mg by mouth as needed.     . propranolol (INDERAL) 10 MG tablet Take 1 tablet (10 mg total) by mouth daily as needed (anxiety). Anxiety  30 tablet 2  . RIVAROXABAN (XARELTO) VTE STARTER PACK (15 & 20 MG TABLETS) Follow package directions: Take one 15mg  tablet by mouth twice a day. On day 22, switch to one 20mg  tablet once a day. Take with food. 51 each 0   No current facility-administered medications for this visit.    Allergies  Allergen Reactions  . Morphine And Related Other (See Comments)    Unknown   . Adhesive [Tape] Rash    blisters  . Nickel Rash    Family History  Problem Relation Age of Onset  . Arthritis Mother   . Hyperlipidemia Mother   . Heart disease Mother   . Hypertension Mother   . Rheum arthritis Maternal Aunt   . Hyperlipidemia Paternal Aunt   . Diabetes Paternal Aunt   . Rheum arthritis  Maternal Grandmother   . Heart disease Maternal Grandmother   . Arthritis Maternal Grandfather   . Colon cancer Maternal Grandfather   . Hyperlipidemia Maternal Grandfather   . Heart disease Maternal Grandfather   . Hypertension Maternal Grandfather     Social History   Socioeconomic History  . Marital status: Married    Spouse name: Not on file  . Number of children: Not on file  . Years of education: Not on file  . Highest education level: Not on file  Occupational History  . Not on file  Tobacco Use  . Smoking status: Never Smoker  . Smokeless tobacco: Never Used  Vaping Use  . Vaping Use: Never used  Substance and Sexual Activity  . Alcohol use: No    Comment: seldom   . Drug use: No  . Sexual activity: Yes    Birth control/protection: None  Other Topics Concern  . Not on file  Social History Narrative  . Not on file   Social Determinants of Health   Financial Resource Strain:   . Difficulty of Paying Living Expenses:   Food Insecurity:   . Worried About Charity fundraiser in the Last Year:   . Arboriculturist in the Last Year:   Transportation Needs:   . Film/video editor (Medical):   Marland Kitchen Lack of Transportation (Non-Medical):   Physical Activity:   . Days of Exercise per Week:   . Minutes of Exercise per Session:   Stress:   . Feeling of Stress :   Social Connections:   . Frequency of Communication with Friends and Family:   . Frequency of Social Gatherings with Friends and Family:   . Attends Religious Services:   . Active Member of Clubs or Organizations:   . Attends Archivist Meetings:   Marland Kitchen Marital Status:   Intimate Partner Violence:   . Fear of Current or Ex-Partner:   . Emotionally Abused:   Marland Kitchen Physically Abused:   . Sexually Abused:      Constitutional: Denies fever, malaise, fatigue, headache or abrupt weight changes.  Respiratory: Denies difficulty breathing, shortness of breath, cough or sputum production.    Cardiovascular: Denies chest pain, chest tightness, palpitations or swelling in the hands or feet.  Musculoskeletal: Pt reports neck pain, right leg pain. Denies decrease in range of motion, difficulty with gait, muscle pain or joint pain and swelling.  Skin: Denies redness, rashes, lesions or ulcercations.  Neurological: Denies dizziness, difficulty with memory, difficulty with speech or problems with balance and coordination.    No other specific complaints in a complete review of systems (except as listed in HPI above).  Objective:   Physical Exam   BP 136/84 (BP Location: Left Arm, Patient Position: Sitting, Cuff Size: Large)   Pulse (!) 104   Temp 97.7 F (36.5 C) (Temporal)   Ht 5' 5.75" (1.67 m)   Wt (!) 312 lb (141.5 kg)   LMP 06/13/2020   SpO2 98%   BMI 50.74 kg/m   Wt Readings from Last 3 Encounters:  06/26/20 (!) 313 lb (142 kg)  06/14/20 (!) 313 lb (142 kg)  02/15/20 (!) 308 lb (139.7 kg)    General: Appears her stated age, obese, in NAD. Skin: Incision to anterior neck healed. Cardiovascular: Normal rate and rhythm. S1,S2 noted.  No murmur, rubs or gallops noted. No JVD or BLE edema.  Pulmonary/Chest: Normal effort and positive vesicular breath sounds. No respiratory distress. No wheezes, rales or ronchi noted.  Musculoskeletal: Limited ROM post op, has not started PT. She wears a soft collar as needed. Neurological: Alert and oriented.   BMET    Component Value Date/Time   NA 137 07/07/2020 0943   K 3.6 07/07/2020 0943   CL 104 07/07/2020 0943   CO2 26 06/26/2020 1701   GLUCOSE 114 (H) 07/07/2020 0943   BUN 12 07/07/2020 0943   CREATININE 0.90 07/07/2020 0943   CREATININE 1.01 06/14/2020 1637   CALCIUM 8.7 (L) 06/26/2020 1701   GFRNONAA >60 06/26/2020 1701   GFRAA >60 06/26/2020 1701    Lipid Panel     Component Value Date/Time   CHOL 224 (H) 02/15/2020 1514   TRIG 96.0 02/15/2020 1514   HDL 59.30 02/15/2020 1514   CHOLHDL 4 02/15/2020  1514   VLDL 19.2 02/15/2020 1514   LDLCALC 145 (H) 02/15/2020 1514    CBC    Component Value Date/Time   WBC 9.1 06/26/2020 1701   RBC 4.35 06/26/2020 1701   HGB 13.6 07/07/2020 0943   HCT 40.0 07/07/2020 0943   PLT 233 06/26/2020 1701   MCV 90.1 06/26/2020 1701   MCH 29.9 06/26/2020 1701   MCHC 33.2 06/26/2020 1701   RDW 13.4 06/26/2020 1701   LYMPHSABS 2.2 06/26/2020 1701   MONOABS 1.1 (H) 06/26/2020 1701   EOSABS 0.3 06/26/2020 1701   BASOSABS 0.1 06/26/2020 1701    Hgb A1C Lab Results  Component Value Date   HGBA1C 5.3 06/14/2020           Assessment & Plan:   ER Follow Up for Postoperative Pain s/p C5-C6 Cervical Fusion, Dysphagia, DVT RLE:  ER notes, labs and imaging reviewed. She will continue Xarelto per vascular She will continue Oxycodone as needed She will follow up with neurosurgery as scheduled She will follow up with vascular as scheduled  Return precautions discussed [name], NP This visit occurred during the SARS-CoV-2 public health emergency.  Safety protocols were in place, including screening questions prior to the visit, additional usage of staff PPE, and extensive cleaning of exam room while observing appropriate contact time as indicated for disinfecting solutions.

## 2020-07-16 NOTE — Telephone Encounter (Signed)
Patient said she's hopefully going back to work next week. Patient's requesting a temporary handicapped placard form. Please call patient when form is complete.

## 2020-07-19 ENCOUNTER — Encounter: Payer: Self-pay | Admitting: Internal Medicine

## 2020-07-19 NOTE — Telephone Encounter (Signed)
msg sent via my chart

## 2020-07-30 ENCOUNTER — Encounter: Payer: BC Managed Care – PPO | Admitting: Vascular Surgery

## 2020-08-01 ENCOUNTER — Other Ambulatory Visit: Payer: Self-pay | Admitting: Internal Medicine

## 2020-08-02 ENCOUNTER — Encounter: Payer: Self-pay | Admitting: Internal Medicine

## 2020-08-02 MED ORDER — LISDEXAMFETAMINE DIMESYLATE 20 MG PO CAPS: 20.0000 mg | ORAL_CAPSULE | Freq: Two times a day (BID) | ORAL | 0 refills | Status: DC

## 2020-08-02 NOTE — Telephone Encounter (Signed)
Last filled 06/11/2020...Marland Kitchen please advise

## 2020-09-02 ENCOUNTER — Other Ambulatory Visit: Payer: Self-pay

## 2020-09-02 ENCOUNTER — Encounter: Payer: Self-pay | Admitting: Internal Medicine

## 2020-09-02 MED ORDER — LISDEXAMFETAMINE DIMESYLATE 20 MG PO CAPS: 20.0000 mg | ORAL_CAPSULE | Freq: Two times a day (BID) | ORAL | 0 refills | Status: DC

## 2020-09-02 NOTE — Telephone Encounter (Signed)
Last filled 08/02/2020...Marland Kitchen please advise

## 2020-09-03 ENCOUNTER — Other Ambulatory Visit: Payer: Self-pay | Admitting: Internal Medicine

## 2020-09-29 ENCOUNTER — Encounter: Payer: Self-pay | Admitting: Internal Medicine

## 2020-09-30 ENCOUNTER — Other Ambulatory Visit: Payer: Self-pay

## 2020-09-30 NOTE — Telephone Encounter (Signed)
Last filled 09/02/2020... note [facility] on or after 10/02/2020

## 2020-10-01 MED ORDER — LISDEXAMFETAMINE DIMESYLATE 20 MG PO CAPS: 20.0000 mg | ORAL_CAPSULE | Freq: Two times a day (BID) | ORAL | 0 refills | Status: DC

## 2020-10-02 ENCOUNTER — Other Ambulatory Visit: Payer: Self-pay | Admitting: Internal Medicine

## 2020-10-03 ENCOUNTER — Other Ambulatory Visit: Payer: Self-pay

## 2020-10-03 ENCOUNTER — Encounter: Payer: Self-pay | Admitting: Family Medicine

## 2020-10-03 ENCOUNTER — Ambulatory Visit (INDEPENDENT_AMBULATORY_CARE_PROVIDER_SITE_OTHER): Payer: BC Managed Care – PPO | Admitting: Family Medicine

## 2020-10-03 VITALS — BP 159/96 | HR 93 | Ht 66.0 in | Wt 319.0 lb

## 2020-10-03 DIAGNOSIS — N920 Excessive and frequent menstruation with regular cycle: Secondary | ICD-10-CM

## 2020-10-03 DIAGNOSIS — N83201 Unspecified ovarian cyst, right side: Secondary | ICD-10-CM | POA: Diagnosis not present

## 2020-10-03 DIAGNOSIS — D250 Submucous leiomyoma of uterus: Secondary | ICD-10-CM

## 2020-10-03 MED ORDER — NORETHINDRONE ACETATE 5 MG PO TABS: 5.0000 mg | ORAL_TABLET | Freq: Every day | ORAL | 2 refills | Status: DC

## 2020-10-03 NOTE — Progress Notes (Signed)
Fibroid tumor and cyst to discuss removal, records in care everywhere form her Dr in [place]

## 2020-10-03 NOTE — Progress Notes (Signed)
Subjective:    Patient ID: Maria Bright is a 44 y.o. female presenting with surgical consult  on 10/03/2020  HPI: Transfers in from [place]. Has had extensive w/u there. On OCs for 17 years. Stopped them last year following her husband's vasectomy. Cycles have been so much worse with significant hemorrhaging through clothes, bed clothes and is quite a hindrance to her ADLs. Cycles last 7-10 days.  She has also had increasing pain. Had resumed the OCs for 1 month to have c-spine surgery to push the timing out to after the surgery was complete, then post-operatively had DVT, August 7.  On Xarelto for 6 months then will taper off in February. She continues to have bleeding  Now worse with anti-coagulation and u/s reveals submucosal fibroid. Estrogen would be contraindicated. IUD is less desirable due to abnormal cavity. Has h/o SVD x 2. Has had negative EMB with OB/GYN in [place]. Also with 5 cm simple cyst on right found on last u/s, without symptoms. For prophylaxis post operatively.  Review of Systems  Constitutional: Negative for chills and fever.  Respiratory: Negative for shortness of breath.   Cardiovascular: Negative for chest pain.  Gastrointestinal: Negative for abdominal pain, nausea and vomiting.  Genitourinary: Negative for dysuria.  Skin: Negative for rash.      Objective:    BP (!) 159/96   Pulse 93   Ht 5\' 6"  (1.676 m)   Wt (!) 319 lb (144.7 kg)   LMP 09/23/2020 (Exact Date)   BMI 51.49 kg/m  Physical Exam Constitutional:      General: She is not in acute distress.    Appearance: She is well-developed.  HENT:     Head: Normocephalic and atraumatic.  Eyes:     General: No scleral icterus. Cardiovascular:     Rate and Rhythm: Normal rate.  Pulmonary:     Effort: Pulmonary effort is normal.  Abdominal:     Palpations: Abdomen is soft.  Musculoskeletal:     Cervical back: Neck supple.  Skin:    General: Skin is warm and dry.  Neurological:     Mental  Status: She is alert and oriented to person, place, and time.         Assessment & Plan:   Problem List Items Addressed This Visit      Unprioritized   Menorrhagia - Primary    Likely related to submucosal fibroid. Options reviewed. For D & C with HTA. On Aygestin for now to control cycles. Likely ok with Xarelto though risks reviewed. Discussed option of TVH with blood thinners, timing, risks of DVT post surgically vs. Minor procedure and not having to hold her blood thinners. After careful consideration, she has opted for HTA with Myosure resection of fibroid. Risks include but are not limited to bleeding, infection, injury to surrounding structures, including bowel, bladder and ureters, blood clots, and death.  Likelihood of success is moderate.       Relevant Medications   norethindrone (AYGESTIN) 5 MG tablet   Fibroids, submucosal    Will attempt Myosure resection      Cyst of right ovary    will f/u with u/s in 6 wks      Relevant Orders   US PELVIC COMPLETE WITH TRANSVAGINAL      Total time in review of prior notes, pathology, labs, history taking, review with patient, exam, note writing, discussion of options, plan for next steps, alternatives and risks of treatment: 40 minutes.  Return in about 3  months (around 01/03/2021) for postop check.  Maria Bright 10/04/2020 4:50 PM

## 2020-10-04 ENCOUNTER — Encounter: Payer: Self-pay | Admitting: Family Medicine

## 2020-10-04 DIAGNOSIS — D250 Submucous leiomyoma of uterus: Secondary | ICD-10-CM | POA: Insufficient documentation

## 2020-10-04 DIAGNOSIS — N920 Excessive and frequent menstruation with regular cycle: Secondary | ICD-10-CM | POA: Insufficient documentation

## 2020-10-04 DIAGNOSIS — N83201 Unspecified ovarian cyst, right side: Secondary | ICD-10-CM | POA: Insufficient documentation

## 2020-10-04 NOTE — Assessment & Plan Note (Signed)
will f/u with u/s in 6 wks

## 2020-10-04 NOTE — Assessment & Plan Note (Signed)
Will attempt Myosure resection

## 2020-10-04 NOTE — Assessment & Plan Note (Addendum)
Likely related to submucosal fibroid. Options reviewed. For D & C with HTA. On Aygestin for now to control cycles. Likely ok with Xarelto though risks reviewed. Discussed option of TVH with blood thinners, timing, risks of DVT post surgically vs. Minor procedure and not having to hold her blood thinners. After careful consideration, she has opted for HTA with Myosure resection of fibroid. Risks include but are not limited to bleeding, infection, injury to surrounding structures, including bowel, bladder and ureters, blood clots, and death.  Likelihood of success is moderate.

## 2020-10-09 ENCOUNTER — Encounter: Payer: Self-pay | Admitting: Internal Medicine

## 2020-10-16 ENCOUNTER — Other Ambulatory Visit: Payer: Self-pay | Admitting: Internal Medicine

## 2020-10-30 ENCOUNTER — Ambulatory Visit: Payer: BC Managed Care – PPO

## 2020-11-07 ENCOUNTER — Ambulatory Visit
Admission: RE | Admit: 2020-11-07 | Discharge: 2020-11-07 | Disposition: A | Discharge status: Home / Self Care | Payer: BC Managed Care – PPO | Source: Ambulatory Visit | Attending: Family Medicine | Admitting: Family Medicine

## 2020-11-07 ENCOUNTER — Other Ambulatory Visit: Payer: Self-pay | Admitting: *Deleted

## 2020-11-07 ENCOUNTER — Other Ambulatory Visit: Payer: Self-pay

## 2020-11-07 ENCOUNTER — Telehealth: Payer: Self-pay

## 2020-11-07 DIAGNOSIS — N838 Other noninflammatory disorders of ovary, fallopian tube and broad ligament: Secondary | ICD-10-CM

## 2020-11-07 DIAGNOSIS — N83201 Unspecified ovarian cyst, right side: Secondary | ICD-10-CM | POA: Insufficient documentation

## 2020-11-07 NOTE — Telephone Encounter (Signed)
Does she have a GYN she can follow up with? Does she want to see GYN or proceed with MRI.

## 2020-11-07 NOTE — Telephone Encounter (Signed)
Results in Epic: Posterior RIGHT upper uterine leiomyoma 5.0 cm greatest size.  Complicated cystic lesion of RIGHT ovary 6.3 cm diameter containing low level internal echogenicity, 2 thin septations containing internal blood flow on color Doppler imaging and a questionable area of mural nodularity.  Either surgical evaluation or further characterization by MR imaging with and without contrast recommended.

## 2020-11-08 DIAGNOSIS — N83201 Unspecified ovarian cyst, right side: Secondary | ICD-10-CM

## 2020-11-08 NOTE — Telephone Encounter (Signed)
MRI ordered. Will hold off on referral until MRI results are available.

## 2020-11-08 NOTE — Addendum Note (Signed)
Addended by: Jearld Fenton on: 11/08/2020 01:33 PM   Modules accepted: Orders

## 2020-11-12 ENCOUNTER — Encounter: Payer: Self-pay | Admitting: Gynecologic Oncology

## 2020-11-12 ENCOUNTER — Telehealth: Payer: Self-pay | Admitting: *Deleted

## 2020-11-12 NOTE — Telephone Encounter (Signed)
Patient called and was scheduled for a new patient tomorrow

## 2020-11-13 ENCOUNTER — Encounter: Payer: Self-pay | Admitting: Gynecologic Oncology

## 2020-11-13 ENCOUNTER — Inpatient Hospital Stay: Payer: BC Managed Care – PPO | Attending: Gynecologic Oncology

## 2020-11-13 ENCOUNTER — Other Ambulatory Visit: Payer: Self-pay

## 2020-11-13 ENCOUNTER — Inpatient Hospital Stay (HOSPITAL_BASED_OUTPATIENT_CLINIC_OR_DEPARTMENT_OTHER): Payer: BC Managed Care – PPO | Admitting: Gynecologic Oncology

## 2020-11-13 ENCOUNTER — Ambulatory Visit (HOSPITAL_COMMUNITY)
Admission: RE | Admit: 2020-11-13 | Discharge: 2020-11-13 | Disposition: A | Discharge status: Home / Self Care | Payer: BC Managed Care – PPO | Source: Ambulatory Visit | Attending: Internal Medicine | Admitting: Internal Medicine

## 2020-11-13 VITALS — BP 159/97 | HR 98 | Temp 98.5°F | Resp 20 | Ht 66.0 in | Wt 313.0 lb

## 2020-11-13 DIAGNOSIS — F909 Attention-deficit hyperactivity disorder, unspecified type: Secondary | ICD-10-CM | POA: Diagnosis not present

## 2020-11-13 DIAGNOSIS — N838 Other noninflammatory disorders of ovary, fallopian tube and broad ligament: Secondary | ICD-10-CM

## 2020-11-13 DIAGNOSIS — I10 Essential (primary) hypertension: Secondary | ICD-10-CM | POA: Diagnosis not present

## 2020-11-13 DIAGNOSIS — Z79899 Other long term (current) drug therapy: Secondary | ICD-10-CM | POA: Insufficient documentation

## 2020-11-13 DIAGNOSIS — Z7901 Long term (current) use of anticoagulants: Secondary | ICD-10-CM | POA: Insufficient documentation

## 2020-11-13 DIAGNOSIS — K219 Gastro-esophageal reflux disease without esophagitis: Secondary | ICD-10-CM | POA: Insufficient documentation

## 2020-11-13 DIAGNOSIS — N939 Abnormal uterine and vaginal bleeding, unspecified: Secondary | ICD-10-CM | POA: Insufficient documentation

## 2020-11-13 DIAGNOSIS — F32A Depression, unspecified: Secondary | ICD-10-CM | POA: Insufficient documentation

## 2020-11-13 DIAGNOSIS — E282 Polycystic ovarian syndrome: Secondary | ICD-10-CM | POA: Diagnosis not present

## 2020-11-13 DIAGNOSIS — N83201 Unspecified ovarian cyst, right side: Secondary | ICD-10-CM | POA: Diagnosis present

## 2020-11-13 DIAGNOSIS — Z86718 Personal history of other venous thrombosis and embolism: Secondary | ICD-10-CM | POA: Diagnosis not present

## 2020-11-13 MED ORDER — GADOBUTROL 1 MMOL/ML IV SOLN
10.0000 mL | Freq: Once | INTRAVENOUS | Status: AC | PRN
  Administered 2020-11-13: 10 mL via INTRAVENOUS

## 2020-11-13 NOTE — Progress Notes (Signed)
GYNECOLOGIC ONCOLOGY NEW PATIENT CONSULTATION   Patient Name: Maria Bright  Patient Age: 44 y.o. Date of Service: 11/13/2020 Referring Provider: Dr. Kennon Rounds  Primary Care Provider: Jearld Fenton, NP Consulting Provider: Jeral Pinch, MD   Assessment/Plan:  Premenopausal woman with abnormal uterine bleeding with known uterine fibroid scheduled to undergo transvaginal myomectomy with prior imaging showing simple ovarian cyst now noted to have some complexity on follow-up ultrasound.  Kaeya is scheduled to undergo an MRI this evening.  We discussed the features on her most recent ultrasound that raise the concern for a possible borderline or malignant process.  It is difficult for me to directly compare this to the ultrasound she had in [place] since I do not have access to these images.  Given her age, history consistent with polycystic ovarian syndrome, and recent ultrasounds, my suspicion is that this is a benign process.  I recommended getting a CA-125 today.  We discussed the use of this test and the difficulty as it is not very sensitive or specific.  I reviewed that it may be elevated in noncancerous processes such as fibroids and endometriosis and can be normal in up to 30-50% of early stage cancer.  I will plan to call the patient tomorrow once I have time to review her MRI.  If there are features that are concerning for possible malignancy, then I suggest we move forward with getting her scheduled for surgery and likely delaying or canceling her surgery scheduled next week.  If the MRI does not have features that are concerning for malignancy, as I suspect will be the case, then I think it would be very reasonable for her to continue with the surgery scheduled next week.  We would then discuss the options of continued close imaging surveillance with surgery if she became symptomatic or the cyst grew/developed concerning features versus surgery sooner to remove the cyst or  ovary.  In addition to hysteroscopy, myomectomy, and D&C, she has discussed possible endometrial ablation in the setting of her heavy and abnormal uterine bleeding.  Today, we also discussed placement of an MRI if she decides not to proceed with ablation as an option to keep her uterine lining thin.  Anntionette is in agreement that hysterectomy, or an extensive surgery would put her at increased risk given her recent DVT and current anticoagulation therapy.  Her blood clot likely developed in the setting of spine surgery and being on low-dose combination birth control pills.  A copy of this note was sent to the patient's referring provider.   65 minutes of total time was spent for this patient encounter, including preparation, face-to-face counseling with the patient and coordination of care, and documentation of the encounter.   Jeral Pinch, MD  Division of Gynecologic Oncology  Department of Obstetrics and Gynecology  [facility]  ___________________________________________  Chief Complaint: Chief Complaint  Patient presents with  . Cyst of right ovary    Second Opinion    History of Present Illness:  Maria Bright is a 45 y.o. y.o. female who is seen in consultation at the request of Dr. Kennon Rounds for an evaluation of a complex adnexal mass.  Patient was followed in [place] for her GYN care and recently transferred care to a provider here in [place].  She was on oral birth control pills for approximately 17 years and then came off of them at the beginning of the year after her husband had a vasectomy.  Afterwards, she had very frequent vaginal  spotting and significant increase in bleeding during her menses.  She now has regular menses every 30 days but her bleeding will last anywhere from 7 to 14 days and is associated with very heavy bleeding, cramping and pain.  She resumed OCPs for 1 month to be able to have back surgery.  After her back surgery this  summer, she had a postoperative DVT in early August.  She was taken off OCPs and had an increase in her bleeding.  She was then subsequently started on anticoagulation.  She is taking Xarelto with a plan to taper off in February.  She describes having to a significant number of pads and tampons when she is menstruating and frequently bleeding through her clothing.  Ultrasound in mid October showed a thickened endometrium with a mass measuring 4.1 cm consistent with a submucosal fibroid.  Endometrial biopsy performed in [place] with her OB/GYN was negative for atypia or malignancy.  Plan currently is to proceed with hysteroscopy, vaginal myomectomy, D&C, and endometrial ablation next week.  On her ultrasound in October in [place], a simple 5 cm cyst was described on the right ovary.  Follow-up ultrasound on 12/9 showed a right uterine fibroid measuring up to 5 cm and a complicated cyst on the right ovary measuring 6.3 cm containing low-level internal echogenicity, to the been septations containing internal blood flow and questionable area of mural nodularity.  Maria Bright is scheduled to undergo MRI of the pelvis this evening.  Patient reports having a good appetite without any nausea or emesis.  She denies constipation or diarrhea.  She has some abdominal pain as well as dyspareunia.  She denies any urinary symptoms.  She lives in [place] with her husband and two children.  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  Patient has significant food sensitivities and environmental sensitivities.  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  She gets allergy shots every week.  PAST MEDICAL HISTORY:  Past Medical History:  Diagnosis Date  . Abnormal uterine bleeding   . ADHD (attention deficit hyperactivity disorder)   . Anxiety   . Complication of anesthesia    30 years ago ; woke up in middle of wisdom tooth extraction   . Depression   . DVT, lower  extremity, recurrent, right ([facility]) 07/06/2020  . GERD (gastroesophageal reflux disease)   . Hypertension   . Ovarian cyst   . Polycystic disease, ovaries    POC   . Seasonal allergies   . Sleep apnea    CPAP   . Uterine fibroid      PAST SURGICAL HISTORY:  Past Surgical History:  Procedure Laterality Date  . BACK SURGERY  05/2020   fUSION C5 AND C6  . CHOLECYSTECTOMY N/A 06/30/2017   Procedure: LAPAROSCOPIC CHOLECYSTECTOMY;  Surgeon: Kinsinger, Arta Bruce, MD;  Location: [facility]S;  Service: General;  Laterality: N/A;  . WISDOM TOOTH EXTRACTION      OB/GYN HISTORY:  OB History  Gravida Para Term Preterm AB Living  6 2     2 2   SAB IAB Ectopic Multiple Live Births  2       2    # Outcome Date GA Lbr Len/2nd Weight Sex Delivery Anes PTL Lv  6 Para           5 Para           4 Gravida      Vag-Spont     3 [place]  Vag-Spont     2 SAB           1 SAB             No LMP recorded.  Age at menarche: 24 Age at menopause: N/A Hx of HRT: N/A Hx of STDs: No Last pap: 2020, negative History of abnormal pap smears: No  SCREENING STUDIES:  Last mammogram: 04/2020   MEDICATIONS: Outpatient Encounter Medications as of 11/13/2020  Medication Sig  . buPROPion (WELLBUTRIN XL) 300 MG 24 hr tablet TAKE 1 TABLET BY MOUTH EVERY DAY  . cetirizine (ZYRTEC) 10 MG tablet Take 10 mg by mouth at bedtime.  Marland Kitchen desvenlafaxine (PRISTIQ) 100 MG 24 hr tablet TAKE 1 TABLET (100 MG TOTAL) BY MOUTH EVERY EVENING.  . diphenhydrAMINE (BENADRYL) 25 MG tablet Take 50 mg by mouth every 6 (six) hours as needed for itching or allergies.  . fluticasone (FLONASE) 50 MCG/ACT nasal spray Place 2 sprays into both nostrils daily. (Patient taking differently: Place 2 sprays into both nostrils daily. As needed)  . Fluticasone Furoate (ARNUITY ELLIPTA) 100 MCG/ACT AEPB Take 1 Inhaler by mouth daily.  Marland Kitchen gabapentin (NEURONTIN) 300 MG capsule Take 300 mg by mouth. 300 mg am, mid day and 600 mg at hs  . ibuprofen  (ADVIL) 800 MG tablet Take 800 mg by mouth every 8 (eight) hours as needed.  Marland Kitchen lisdexamfetamine (VYVANSE) 20 MG capsule Take 1 capsule (20 mg total) by mouth 2 (two) times daily.  . metaxalone (SKELAXIN) 800 MG tablet Take 1 tablet (800 mg total) by mouth daily as needed for muscle spasms.  . norethindrone (AYGESTIN) 5 MG tablet Take 1 tablet (5 mg total) by mouth daily.  Marland Kitchen omeprazole (PRILOSEC OTC) 20 MG tablet Take 20 mg by mouth as needed.   . propranolol (INDERAL) 10 MG tablet TAKE 1 TABLET (10 MG TOTAL) BY MOUTH DAILY AS NEEDED (ANXIETY). ANXIETY  . XARELTO 20 MG TABS tablet Take 20 mg by mouth daily.  Marland Kitchen albuterol (VENTOLIN HFA) 108 (90 Base) MCG/ACT inhaler Inhale 2 puffs into the lungs every 6 (six) hours as needed for wheezing or shortness of breath. (Patient not taking: Reported on 11/12/2020)  . EPINEPHrine 0.3 mg/0.3 mL IJ SOAJ injection auto-injector into outer thigh (Patient not taking: Reported on 11/12/2020)  . [DISCONTINUED] gabapentin (NEURONTIN) 300 MG capsule Take 300 mg by mouth. 1 capsule in the morning, 2 capsules at bedtime  . [DISCONTINUED] RIVAROXABAN (XARELTO) VTE STARTER PACK (15 & 20 MG TABLETS) Follow package directions: Take one 15mg  tablet by mouth twice a day. On day 22, switch to one 20mg  tablet once a day. Take with food.   No facility-administered encounter medications on file as of 11/13/2020.    ALLERGIES:  Allergies  Allergen Reactions  . Morphine And Related Other (See Comments)    Unknown   . Adhesive [Tape] Rash    blisters  . Nickel Rash     FAMILY HISTORY:  Family History  Problem Relation Age of Onset  . Arthritis Mother   . Hyperlipidemia Mother   . Heart disease Mother   . Hypertension Mother   . Rheum arthritis Maternal Aunt   . Hyperlipidemia Paternal Aunt   . Diabetes Paternal Aunt   . Rheum arthritis Maternal Grandmother   . Heart disease Maternal Grandmother   . Arthritis Maternal Grandfather   . Hyperlipidemia Maternal  Grandfather   . Heart disease Maternal Grandfather   . Hypertension Maternal Grandfather   . Prostate cancer Paternal  Uncle   . Colon cancer Neg Hx   . Breast cancer Neg Hx   . Pancreatic cancer Neg Hx   . Endometrial cancer Neg Hx   . Ovarian cancer Neg Hx      SOCIAL HISTORY:  Social Connections: Not on file    REVIEW OF SYSTEMS:  Reports abdominal pain, dyspareunia, menstrual problem, vaginal bleeding, back pain, muscle pain/cramps. Denies appetite changes, fevers, chills, fatigue, unexplained weight changes. Denies hearing loss, neck lumps or masses, mouth sores, ringing in ears or voice changes. Denies cough or wheezing.  Denies shortness of breath. Denies chest pain or palpitations. Denies leg swelling. Denies abdominal distention, blood in stools, constipation, diarrhea, nausea, vomiting, or early satiety. Denies dysuria, frequency, hematuria or incontinence. Denies hot flashes, pelvic pain, or vaginal discharge.   Denies joint pain. Denies itching, rash, or wounds. Denies dizziness, headaches, numbness or seizures. Denies swollen lymph nodes or glands, denies easy bruising or bleeding. Denies anxiety, depression, confusion, or decreased concentration.  Physical Exam:  Vital Signs for this encounter:  Blood pressure (!) 159/97, pulse 98, temperature 98.5 F (36.9 C), temperature source Tympanic, resp. rate 20, height 5\' 6"  (1.676 m), weight (!) 313 lb (142 kg), SpO2 99 %. Body mass index is 50.52 kg/m. General: Alert, oriented, no acute distress.  HEENT: Normocephalic, atraumatic. Sclera anicteric.  Chest: Clear to auscultation bilaterally. No wheezes, rhonchi, or rales. Cardiovascular: Regular rate and rhythm, no murmurs, rubs, or gallops.  Abdomen: Obese. Normoactive bowel sounds. Soft, nondistended, nontender to palpation. No masses or hepatosplenomegaly appreciated. No palpable fluid wave.  Extremities: Grossly normal range of motion. Warm, well perfused. No edema  bilaterally.  Skin: No rashes or lesions.  Lymphatics: No cervical, supraclavicular, or inguinal adenopathy.  GU:  Normal external female genitalia. No lesions. No discharge or bleeding.             Bladder/urethra:  No lesions or masses, well supported bladder             Vagina: Well rugated, no lesions or masses.             Cervix: Normal appearing, no lesions.             Uterus: Small, mobile, no parametrial involvement or nodularity.  Exam somewhat limited by body habitus.             Adnexa: Cyst fullness appreciated in the midline of the cul-de-sac, smooth, no nodularity noted.  LABORATORY AND RADIOLOGIC DATA:  Outside medical records were reviewed to synthesize the above history, along with the history and physical obtained during the visit.   Lab Results  Component Value Date   WBC 9.1 06/26/2020   HGB 13.6 07/07/2020   HCT 40.0 07/07/2020   PLT 233 06/26/2020   GLUCOSE 114 (H) 07/07/2020   CHOL 224 (H) 02/15/2020   TRIG 96.0 02/15/2020   HDL 59.30 02/15/2020   LDLCALC 145 (H) 02/15/2020   ALT 19 06/14/2020   AST 16 06/14/2020   NA 137 07/07/2020   K 3.6 07/07/2020   CL 104 07/07/2020   CREATININE 0.90 07/07/2020   BUN 12 07/07/2020   CO2 26 06/26/2020   TSH 1.86 02/15/2020   INR 0.9 06/14/2020   HGBA1C 5.3 06/14/2020   Endometrial biopsy 10/13: Proliferative endometrium, no atypia or carcinoma.  Pelvic ultrasound 12/9: Uterus  Measurements: 10.1 x 5.2 x 6.2 cm = volume: 170 mL. Heterogeneous myometrium. Posterior RIGHT leiomyoma at upper uterus 5.0 x 4.8 x 3.3 cm,  transmural. No additional masses.  Endometrium  Thickness: 14 mm.  No endometrial fluid or focal abnormality  Right ovary  Measurements: 5.8 x 3.1 x 5.0 cm = volume: 47.3 mL. Mildly complicated cyst within RIGHT ovary 6.3 x 5.2 x 4.8 cm, containing scattered low level internal echogenicity, 2 thin septations containing internal blood flow on color Doppler imaging and an area of mural  nodularity 2 cm diameter versus less likely residual normal ovary.  Left ovary  Measurements: 3.5 x 2.4 x 2.5 cm = volume: 10.6 mL. Numerous follicles. Small exophytic follicle 13 mm diameter; no further imaging recommended.  Other findings  No free pelvic fluid.  No adnexal masses.  IMPRESSION: Posterior RIGHT upper uterine leiomyoma 5.0 cm greatest size.  Complicated cystic lesion of RIGHT ovary 6.3 cm diameter containing low level internal echogenicity, 2 thin septations containing internal blood flow on color Doppler imaging and a questionable area of mural nodularity.   Pelvic ultrasound 09/10/20: Transvaginal ultrasound: Anteverted uterus. Thickened endometrium/mass c/w 4.1 cm submucosal fibroid. Left ovary normal. Right ovary 5.1 cm simple cyst. No free fluid noted.

## 2020-11-13 NOTE — Patient Instructions (Signed)
It was a pleasure meeting you today. I will call you with the MRI results once I am out of surgery tomorrow. Depending on the results, we will discuss continued monitoring of the cyst with imaging or surgery (and if surgery, who best to do the surgery and when).

## 2020-11-14 ENCOUNTER — Telehealth: Payer: Self-pay | Admitting: Gynecologic Oncology

## 2020-11-14 ENCOUNTER — Encounter: Payer: Self-pay | Admitting: Gynecologic Oncology

## 2020-11-14 LAB — CA 125: Cancer Antigen (CA) 125: 12.8 U/mL (ref 0.0–38.1)

## 2020-11-14 NOTE — Progress Notes (Signed)
Given patient factors for laparoscopic procedure addition next week that would require moving the surgery from [facility] to either [facility] or [facility], adding laparoscopic cystectomy or a oophorectomy will not be possible. After discussing this with Dr. Kennon Rounds, my recommendation is for a follow up ultrasound and 6-8 weeks. Give him Jamar significant bleeding, I recommend moving forward with surger y as scheduled next week. I communicated this to the patient who was very understanding.  Berline Lopes MD

## 2020-11-14 NOTE — Telephone Encounter (Signed)
I called Maria Bright to review MRI results from yesterday evening.  Overall, I think the ovary is very benign in appearance.  There is 1 or maybe 2 thin septations, otherwise the mass on the ovary is cystic.  I see normal ovarian tissue, no other abnormalities of that or the other ovary noted.  I reviewed the images in person with one of the radiologist today.  I offered to paths moving forward.  One would be to perform laparoscopic assessment and potentially remove the cyst or ovary at the time of her scheduled surgery next week.  That would only be possible if Dr. Kennon Rounds is available to add that procedure on.  I could be available in the event that borderline or cancer was found.  The other option would be to continue with imaging surveillance and to move forward with scheduling surgery if the patient were to become symptomatic, if the cyst enlarges significantly, or developed very concerning features.  Maria Bright is thinking she would like to do the surgery to assess the ovary next week if possible.  I have encouraged her to reach out to Dr. Virginia Crews office tomorrow.  Jeral Pinch MD Gynecologic Oncology

## 2020-11-16 ENCOUNTER — Inpatient Hospital Stay (HOSPITAL_COMMUNITY): Admission: RE | Admit: 2020-11-16 | Payer: BC Managed Care – PPO | Source: Ambulatory Visit

## 2020-11-18 ENCOUNTER — Other Ambulatory Visit (HOSPITAL_COMMUNITY)
Admission: RE | Admit: 2020-11-18 | Discharge: 2020-11-18 | Disposition: A | Discharge status: Home / Self Care | Payer: BC Managed Care – PPO | Source: Ambulatory Visit | Attending: Family Medicine | Admitting: Family Medicine

## 2020-11-18 DIAGNOSIS — Z885 Allergy status to narcotic agent status: Secondary | ICD-10-CM | POA: Diagnosis not present

## 2020-11-18 DIAGNOSIS — L501 Idiopathic urticaria: Secondary | ICD-10-CM | POA: Insufficient documentation

## 2020-11-18 DIAGNOSIS — Z20822 Contact with and (suspected) exposure to covid-19: Secondary | ICD-10-CM | POA: Diagnosis not present

## 2020-11-18 DIAGNOSIS — J309 Allergic rhinitis, unspecified: Secondary | ICD-10-CM | POA: Insufficient documentation

## 2020-11-18 DIAGNOSIS — J301 Allergic rhinitis due to pollen: Secondary | ICD-10-CM | POA: Insufficient documentation

## 2020-11-18 DIAGNOSIS — Z01812 Encounter for preprocedural laboratory examination: Secondary | ICD-10-CM | POA: Insufficient documentation

## 2020-11-18 DIAGNOSIS — N920 Excessive and frequent menstruation with regular cycle: Secondary | ICD-10-CM | POA: Diagnosis present

## 2020-11-18 DIAGNOSIS — Z86718 Personal history of other venous thrombosis and embolism: Secondary | ICD-10-CM

## 2020-11-18 DIAGNOSIS — D250 Submucous leiomyoma of uterus: Secondary | ICD-10-CM | POA: Diagnosis not present

## 2020-11-18 DIAGNOSIS — Z9049 Acquired absence of other specified parts of digestive tract: Secondary | ICD-10-CM | POA: Diagnosis not present

## 2020-11-18 NOTE — H&P (Signed)
Maria Bright is an 44 y.o. [phone] female.   Chief Complaint: abnormal uterine bleeding HPI: patient with h/o fibroid uterus and recent DVT postsurgical and on OCPs. Now on blood thinners, and having excessive bleeding. Has 4.5 cm submucosal fibroid. On Aygestin daily.  Past Medical History:  Diagnosis Date  . Abnormal uterine bleeding   . ADHD (attention deficit hyperactivity disorder)   . Anxiety   . Complication of anesthesia    30 years ago ; woke up in middle of wisdom tooth extraction   . Depression   . 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 GERD (gastroesophageal reflux disease)   . Hypertension   . Ovarian cyst   . Polycystic disease, ovaries    POC   . Seasonal allergies   . Sleep apnea    CPAP   . Uterine fibroid     Past Surgical History:  Procedure Laterality Date  . BACK SURGERY  05/2020   fUSION C5 AND C6  . CHOLECYSTECTOMY N/A 06/30/2017   Procedure: LAPAROSCOPIC CHOLECYSTECTOMY;  Surgeon: Kieth Brightly Arta Bruce, MD;  Location: [facility];  Service: General;  Laterality: N/A;  . WISDOM TOOTH EXTRACTION      Family History  Problem Relation Age of Onset  . Arthritis Mother   . Hyperlipidemia Mother   . Heart disease Mother   . Hypertension Mother   . Rheum arthritis Maternal Aunt   . Hyperlipidemia Paternal Aunt   . Diabetes Paternal Aunt   . Rheum arthritis Maternal Grandmother   . Heart disease Maternal Grandmother   . Arthritis Maternal Grandfather   . Hyperlipidemia Maternal Grandfather   . Heart disease Maternal Grandfather   . Hypertension Maternal Grandfather   . Prostate cancer Paternal Uncle   . Colon cancer Neg Hx   . Breast cancer Neg Hx   . Pancreatic cancer Neg Hx   . Endometrial cancer Neg Hx   . Ovarian cancer Neg Hx    Social History:  reports that she has never smoked. She has never used smokeless tobacco. She reports that she does not drink alcohol and does not use drugs.  Allergies:  Allergies  Allergen  Reactions  . Morphine And Related Other (See Comments)    Unknown   . Adhesive [Tape] Rash    blisters  . Nickel Rash    No medications prior to admission.    A comprehensive review of systems was negative.  There were no vitals taken for this visit. General appearance: alert, cooperative and appears stated age Head: Normocephalic, without obvious abnormality, atraumatic Neck: supple, symmetrical, trachea midline Lungs: normal effort Heart: regular rate and rhythm Abdomen: soft, non-tender; bowel sounds normal; no masses,  no organomegaly Extremities: extremities normal, atraumatic, no cyanosis or edema Skin: Skin color, texture, turgor normal. No rashes or lesions Neurologic: Grossly normal   Lab Results  Component Value Date   WBC 9.1 06/26/2020   HGB 13.6 07/07/2020   HCT 40.0 07/07/2020   MCV 90.1 06/26/2020   PLT 233 06/26/2020   Lab Results  Component Value Date   PREGTESTUR NEGATIVE 05/23/2017   PREGSERUM NEGATIVE 06/21/2017   HCG <5.0 06/04/2017     Assessment/Plan Principal Problem:   Fibroids, submucosal Active Problems:   Menorrhagia   History of DVT (deep vein thrombosis)  For HTA with resection of fibroid using Myosure following the ablation. Risks include but are not limited to bleeding, infection, injury to surrounding structures, including bowel, bladder and ureters, blood clots, and death.  Likelihood of success is moderate. Also has right ovarian cyst and has seen GYN/Oncology for 2nd opinion. They are sure it is benign and we will continue to follow-up on this with serial imaging.    Maria Bright 11/18/2020, 9:31 AM

## 2020-11-19 ENCOUNTER — Encounter (HOSPITAL_COMMUNITY): Payer: Self-pay | Admitting: Family Medicine

## 2020-11-19 ENCOUNTER — Telehealth: Payer: Self-pay

## 2020-11-19 LAB — SARS CORONAVIRUS 2 (TAT 6-24 HRS): SARS Coronavirus 2: NEGATIVE

## 2020-11-19 NOTE — Progress Notes (Signed)
Spoke with pt for pre-op call. Pt states she has HTN "at times" usually in stressful situations, but has never been prescribed medications for HTN. Pt states she is not diabetic and does not have a cardiac history. Pt had neck surgery in July, 2021 and had a DVT in August. Pt is on Xarelto. She states Dr. Kennon Rounds told her to continue the Xarelto and to take it day of surgery.   Covid test done on 11/18/20 and it's negative.  Pt states she's been in quarantine since the test was done and understands that she stays in quarantine until she comes to the hospital tomorrow.

## 2020-11-19 NOTE — Anesthesia Preprocedure Evaluation (Addendum)
Anesthesia Evaluation  Patient identified by MRN, date of birth, ID band Patient awake    Reviewed: Allergy & Precautions, NPO status , Patient's Chart, lab work & pertinent test results  Airway Mallampati: II  TM Distance: >3 FB Neck ROM: Full    Dental no notable dental hx. (+) Teeth Intact, Dental Advisory Given   Pulmonary asthma , sleep apnea and Continuous Positive Airway Pressure Ventilation ,    Pulmonary exam normal breath sounds clear to auscultation       Cardiovascular hypertension, Pt. on home beta blockers and Pt. on medications + DVT (hx of on xarelto)  Normal cardiovascular exam Rhythm:Regular Rate:Normal     Neuro/Psych Anxiety Depression negative neurological ROS     GI/Hepatic GERD  Controlled and Medicated,  Endo/Other  negative endocrine ROS  Renal/GU negative Renal ROS     Musculoskeletal  (+) Arthritis ,   Abdominal (+) + obese,   Peds  Hematology   Anesthesia Other Findings   Reproductive/Obstetrics                           Anesthesia Physical Anesthesia Plan  ASA: III  Anesthesia Plan: General   Post-op Pain Management:    Induction: Intravenous  PONV Risk Score and Plan: 4 or greater and Treatment may vary due to age or medical condition, Ondansetron, Dexamethasone, Scopolamine patch - Pre-op and Midazolam  Airway Management Planned: LMA  Additional Equipment: None  Intra-op Plan:   Post-operative Plan:   Informed Consent: I have reviewed the patients History and Physical, chart, labs and discussed the procedure including the risks, benefits and alternatives for the proposed anesthesia with the patient or authorized representative who has indicated his/her understanding and acceptance.     Dental advisory given  Plan Discussed with: CRNA and Anesthesiologist  Anesthesia Plan Comments: (See PAT note written 11/19/2020 by Myra Gianotti, PA-C. )       Anesthesia Quick Evaluation

## 2020-11-19 NOTE — Progress Notes (Signed)
Anesthesia Chart Review:  Case: [id number] Date/Time: 11/20/20 1159   Procedure: DILATATION & CURETTAGE/HYSTEROSCOPY WITH HYDROTHERMAL ABLATION AND RESECTION OF FIBROID WITH MYOSURE (N/A )   Anesthesia type: Choice   Pre-op diagnosis: AUB   Location: [facility] OR ROOM 02 / [place] [facility]   Surgeons: [name], MD      DISCUSSION: Patient is a 44 year old female scheduled for the above procedure.  History includes never smoker, PCOS, anxiety, depression, GERD, OSA, asthma, HTN (no on medication), ADHD, uterine fibroids, cholecystectomy (2018), neck surgery (C5-6 ACDF 05/2620, Dr. Ivan Croft), post-operative RLE DVT (07/07/20 in setting of recent back surgery and taking BCP), obesity. She woke up doing wisdom teeth extractions > 20 years ago.  According to Dr. Virginia Crews 10/03/20 office note, she discussed management options for uterine fibroids with menorrhagia. She wrote, "Likely related to submucosal fibroid. Options reviewed. For D & C with HTA. On Aygestin for now to control cycles. Likely ok with Xarelto though risks reviewed. Discussed option of TVH with blood thinners, timing, risks of DVT post surgically vs. Minor procedure and not having to hold her blood thinners. After careful consideration, she has opted for HTA with Myosure resection of fibroid." Patient reported to PAT phone RN that she was instructed to continue Xarelto for procedure.    She was recently evaluated by GYN ONC Dr. Jeral Pinch for finding of ovarian cyst on [name]. MRI done on 11/13/20 and showed a 6.7 cm complex cystic lesion of right ovary with probably benign characteristics, but is suspicious for cystic ovarian neoplasm. Dr. Berline Lopes is going to get a follow-up US in 6-8 weeks and given significant vaginal bleeding, patient to proceed with already scheduled GYN surgery with Dr. Kennon Rounds.   11/18/20 presurgical COVID-19 test negative. She is for labs and anesthesia team evaluation on the day of surgery.   VS:   Wt Readings from Last 3  Encounters:  11/13/20 (!) 142 kg  10/03/20 (!) 144.7 kg  07/16/20 (!) 141.5 kg   BP Readings from Last 3 Encounters:  11/13/20 (!) 159/97  10/03/20 (!) 159/96  07/16/20 136/84   Pulse Readings from Last 3 Encounters:  11/13/20 98  10/03/20 93  07/16/20 (!) 104    PROVIDERS: Jearld Fenton, NP is PCP    LABS: Last lab results currently available include: Lab Results  Component Value Date   WBC 9.1 06/26/2020   HGB 13.6 07/07/2020   HCT 40.0 07/07/2020   PLT 233 06/26/2020   GLUCOSE 114 (H) 07/07/2020   ALT 19 06/14/2020   AST 16 06/14/2020   NA 137 07/07/2020   K 3.6 07/07/2020   CL 104 07/07/2020   CREATININE 0.90 07/07/2020   BUN 12 07/07/2020   CO2 26 06/26/2020   TSH 1.86 02/15/2020   INR 0.9 06/14/2020   HGBA1C 5.3 06/14/2020    IMAGES: MRI Pelvis 11/13/20: IMPRESSION: - 6.7 cm complex cystic lesion of right ovary. This has probably benign characteristics, but is suspicious for cystic ovarian neoplasm. Surgical evaluation should be considered. - 4.5 cm uterine fibroid in the right anterior uterine corpus.  CT soft tissue neck 06/26/20: IMPRESSION: Recent ACDF at C5-6 from a left-sided approach. Hardware appears well position. There is what I would consider to be a normal amount mild edema and residual air bubbles along the operative approach. There is what I would consider to be a normal amount of prevertebral soft tissue swelling and edema in the retropharyngeal space. There does not appear to be a large  focal hematoma or a localized or loculated collection.   EKG: 06/14/20: SR   CV: LE Venous US 07/07/20: Summary:  RIGHT:  - Findings consistent with acute deep vein thrombosis involving the right  posterior tibial veins, and right peroneal veins.  LEFT:  - No evidence of common femoral vein obstruction.     Past Medical History:  Diagnosis Date  . Abnormal uterine bleeding   . ADHD (attention deficit hyperactivity disorder)   . Anxiety    . Asthma   . Complication of anesthesia    30 years ago ; woke up in middle of wisdom tooth extraction   . Depression   . 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 GERD (gastroesophageal reflux disease)   . Hypertension    never has been treated with medications  . Ovarian cyst   . Polycystic disease, ovaries    POC   . Seasonal allergies   . Sleep apnea    CPAP   . Uterine fibroid     Past Surgical History:  Procedure Laterality Date  . ANTERIOR CERVICAL DECOMP/DISCECTOMY FUSION  06/24/2020   C5-6 ACDF (Dr. Ivan Croft)  . CHOLECYSTECTOMY N/A 06/30/2017   Procedure: LAPAROSCOPIC CHOLECYSTECTOMY;  Surgeon: Kinsinger, Arta Bruce, MD;  Location: [facility];  Service: General;  Laterality: N/A;  . WISDOM TOOTH EXTRACTION      MEDICATIONS: No current facility-administered medications for this encounter.   Marland Kitchen acetaminophen (TYLENOL) 500 MG tablet  . albuterol (VENTOLIN HFA) 108 (90 Base) MCG/ACT inhaler  . buPROPion (WELLBUTRIN XL) 300 MG 24 hr tablet  . cetirizine (ZYRTEC) 10 MG tablet  . desvenlafaxine (PRISTIQ) 100 MG 24 hr tablet  . diphenhydrAMINE (BENADRYL) 25 MG tablet  . EPINEPHrine 0.3 mg/0.3 mL IJ SOAJ injection  . fluticasone (FLONASE) 50 MCG/ACT nasal spray  . Fluticasone Furoate (ARNUITY ELLIPTA) 100 MCG/ACT AEPB  . gabapentin (NEURONTIN) 300 MG capsule  . ibuprofen (ADVIL) 800 MG tablet  . lisdexamfetamine (VYVANSE) 20 MG capsule  . metaxalone (SKELAXIN) 800 MG tablet  . norethindrone (AYGESTIN) 5 MG tablet  . olopatadine (PATADAY) 0.1 % ophthalmic solution  . omeprazole (PRILOSEC OTC) 20 MG tablet  . propranolol (INDERAL) 10 MG tablet  . XARELTO 20 MG TABS tablet    Myra Gianotti, PA-C Surgical Short Stay/Anesthesiology [facility] Phone [phone] [facility] Phone [phone] 11/19/2020 2:33 PM

## 2020-11-19 NOTE — Telephone Encounter (Signed)
Her surgery location and time had changed several times, called patient to close the loop.  No answer, left message, surgery is scheduled for [facility] on 11/20/2020 @1200pm , arrive at 10:30am, NPO after midnight, no lotion, no powder, no perfume, no jewelry, and have someone available to drive you. Phone number left to call back if she had any questions.

## 2020-11-20 ENCOUNTER — Ambulatory Visit (HOSPITAL_COMMUNITY): Payer: BC Managed Care – PPO | Admitting: Certified Registered Nurse Anesthetist

## 2020-11-20 ENCOUNTER — Ambulatory Visit (HOSPITAL_COMMUNITY)
Admission: RE | Admit: 2020-11-20 | Discharge: 2020-11-20 | Disposition: A | Discharge status: Home / Self Care | Payer: BC Managed Care – PPO | Attending: Family Medicine | Admitting: Family Medicine

## 2020-11-20 ENCOUNTER — Encounter (HOSPITAL_COMMUNITY): Payer: Self-pay | Admitting: Family Medicine

## 2020-11-20 ENCOUNTER — Other Ambulatory Visit: Payer: Self-pay

## 2020-11-20 ENCOUNTER — Encounter (HOSPITAL_COMMUNITY)
Admission: RE | Disposition: A | Discharge status: Home / Self Care | Payer: Self-pay | Source: Home / Self Care | Attending: Family Medicine

## 2020-11-20 DIAGNOSIS — Z885 Allergy status to narcotic agent status: Secondary | ICD-10-CM | POA: Insufficient documentation

## 2020-11-20 DIAGNOSIS — D250 Submucous leiomyoma of uterus: Secondary | ICD-10-CM | POA: Diagnosis not present

## 2020-11-20 DIAGNOSIS — N920 Excessive and frequent menstruation with regular cycle: Secondary | ICD-10-CM

## 2020-11-20 DIAGNOSIS — Z86718 Personal history of other venous thrombosis and embolism: Secondary | ICD-10-CM | POA: Insufficient documentation

## 2020-11-20 DIAGNOSIS — Z20822 Contact with and (suspected) exposure to covid-19: Secondary | ICD-10-CM | POA: Insufficient documentation

## 2020-11-20 DIAGNOSIS — Z9049 Acquired absence of other specified parts of digestive tract: Secondary | ICD-10-CM | POA: Insufficient documentation

## 2020-11-20 HISTORY — DX: Unspecified asthma, uncomplicated: J45.909

## 2020-11-20 HISTORY — PX: DILITATION & CURRETTAGE/HYSTROSCOPY WITH HYDROTHERMAL ABLATION: SHX5570

## 2020-11-20 LAB — CBC
HCT: 39.2 % (ref 36.0–46.0)
Hemoglobin: 12.6 g/dL (ref 12.0–15.0)
MCH: 28.4 pg (ref 26.0–34.0)
MCHC: 32.1 g/dL (ref 30.0–36.0)
MCV: 88.5 fL (ref 80.0–100.0)
Platelets: 266 10*3/uL (ref 150–400)
RBC: 4.43 MIL/uL (ref 3.87–5.11)
RDW: 13.1 % (ref 11.5–15.5)
WBC: 6.4 10*3/uL (ref 4.0–10.5)
nRBC: 0 % (ref 0.0–0.2)

## 2020-11-20 LAB — BASIC METABOLIC PANEL
Anion gap: 9 (ref 5–15)
BUN: 12 mg/dL (ref 6–20)
CO2: 20 mmol/L — ABNORMAL LOW (ref 22–32)
Calcium: 8.9 mg/dL (ref 8.9–10.3)
Chloride: 107 mmol/L (ref 98–111)
Creatinine, Ser: 1.11 mg/dL — ABNORMAL HIGH (ref 0.44–1.00)
GFR, Estimated: >60 mL/min (ref >60)
Glucose, Bld: 90 mg/dL (ref 70–99)
Potassium: 4.1 mmol/L (ref 3.5–5.1)
Sodium: 136 mmol/L (ref 135–145)

## 2020-11-20 LAB — POCT PREGNANCY, URINE: Preg Test, Ur: NEGATIVE

## 2020-11-20 SURGERY — DILATATION & CURETTAGE/HYSTEROSCOPY WITH HYDROTHERMAL ABLATION
Anesthesia: General | Site: Uterus

## 2020-11-20 MED ORDER — MEPERIDINE HCL 25 MG/ML IJ SOLN: 6.2500 mg | INTRAMUSCULAR | Status: DC | PRN

## 2020-11-20 MED ORDER — SODIUM CHLORIDE 0.9 % IR SOLN
Status: DC | PRN
  Administered 2020-11-20 (×2): 3000 mL

## 2020-11-20 MED ORDER — LIDOCAINE 2% (20 MG/ML) 5 ML SYRINGE
INTRAMUSCULAR | Status: DC | PRN
  Administered 2020-11-20: 100 mg via INTRAVENOUS

## 2020-11-20 MED ORDER — SCOPOLAMINE 1 MG/3DAYS TD PT72
1.0000 | MEDICATED_PATCH | TRANSDERMAL | Status: DC
  Administered 2020-11-20: 1.5 mg via TRANSDERMAL
  Filled 2020-11-20: qty 1

## 2020-11-20 MED ORDER — CHLORHEXIDINE GLUCONATE 0.12 % MT SOLN: 15.0000 mL | Freq: Once | OROMUCOSAL | Status: AC

## 2020-11-20 MED ORDER — PROMETHAZINE HCL 25 MG/ML IJ SOLN: 6.2500 mg | INTRAMUSCULAR | Status: DC | PRN

## 2020-11-20 MED ORDER — LIDOCAINE HCL 1 % IJ SOLN
INTRAMUSCULAR | Status: DC | PRN
  Administered 2020-11-20: 20 mL

## 2020-11-20 MED ORDER — AMISULPRIDE (ANTIEMETIC) 5 MG/2ML IV SOLN: 10.0000 mg | Freq: Once | INTRAVENOUS | Status: DC | PRN

## 2020-11-20 MED ORDER — DEXAMETHASONE SODIUM PHOSPHATE 4 MG/ML IJ SOLN
INTRAMUSCULAR | Status: DC | PRN
  Administered 2020-11-20: 5 mg via INTRAVENOUS

## 2020-11-20 MED ORDER — FENTANYL CITRATE (PF) 250 MCG/5ML IJ SOLN
INTRAMUSCULAR | Status: AC
  Filled 2020-11-20: qty 5

## 2020-11-20 MED ORDER — MIDAZOLAM HCL 5 MG/5ML IJ SOLN
INTRAMUSCULAR | Status: DC | PRN
  Administered 2020-11-20: 2 mg via INTRAVENOUS

## 2020-11-20 MED ORDER — LACTATED RINGERS IV SOLN: INTRAVENOUS | Status: DC

## 2020-11-20 MED ORDER — LACTATED RINGERS IV SOLN: INTRAVENOUS | Status: DC | PRN

## 2020-11-20 MED ORDER — EPHEDRINE SULFATE 50 MG/ML IJ SOLN
INTRAMUSCULAR | Status: DC | PRN
  Administered 2020-11-20: 5 mg via INTRAVENOUS

## 2020-11-20 MED ORDER — ACETAMINOPHEN 500 MG PO TABS
1000.0000 mg | ORAL_TABLET | ORAL | Status: AC
  Administered 2020-11-20: 1000 mg via ORAL
  Filled 2020-11-20: qty 2

## 2020-11-20 MED ORDER — POVIDONE-IODINE 10 % EX SWAB
2.0000 "application " | Freq: Once | CUTANEOUS | Status: AC
  Administered 2020-11-20: 2 via TOPICAL

## 2020-11-20 MED ORDER — CHLORHEXIDINE GLUCONATE 0.12 % MT SOLN
OROMUCOSAL | Status: AC
  Administered 2020-11-20: 15 mL via OROMUCOSAL
  Filled 2020-11-20: qty 15

## 2020-11-20 MED ORDER — OXYCODONE HCL 5 MG PO TABS: 5.0000 mg | ORAL_TABLET | Freq: Four times a day (QID) | ORAL | 0 refills | Status: DC | PRN

## 2020-11-20 MED ORDER — OXYCODONE HCL 5 MG/5ML PO SOLN: 5.0000 mg | Freq: Once | ORAL | Status: DC | PRN

## 2020-11-20 MED ORDER — FENTANYL CITRATE (PF) 250 MCG/5ML IJ SOLN
INTRAMUSCULAR | Status: DC | PRN
  Administered 2020-11-20 (×2): 25 ug via INTRAVENOUS

## 2020-11-20 MED ORDER — HYDROMORPHONE HCL 1 MG/ML IJ SOLN
INTRAMUSCULAR | Status: AC
  Filled 2020-11-20: qty 1

## 2020-11-20 MED ORDER — GABAPENTIN 300 MG PO CAPS
300.0000 mg | ORAL_CAPSULE | ORAL | Status: DC
  Filled 2020-11-20: qty 1

## 2020-11-20 MED ORDER — PROPOFOL 10 MG/ML IV BOLUS
INTRAVENOUS | Status: AC
  Filled 2020-11-20: qty 20

## 2020-11-20 MED ORDER — ORAL CARE MOUTH RINSE: 15.0000 mL | Freq: Once | OROMUCOSAL | Status: AC

## 2020-11-20 MED ORDER — OXYCODONE HCL 5 MG PO TABS
5.0000 mg | ORAL_TABLET | Freq: Once | ORAL | Status: DC | PRN
Start: 2020-11-20 — End: 2020-11-20

## 2020-11-20 MED ORDER — MIDAZOLAM HCL 2 MG/2ML IJ SOLN
INTRAMUSCULAR | Status: AC
  Filled 2020-11-20: qty 2

## 2020-11-20 MED ORDER — ONDANSETRON HCL 4 MG/2ML IJ SOLN
INTRAMUSCULAR | Status: DC | PRN
  Administered 2020-11-20: 4 mg via INTRAVENOUS

## 2020-11-20 MED ORDER — PROPOFOL 10 MG/ML IV BOLUS
INTRAVENOUS | Status: DC | PRN
  Administered 2020-11-20: 200 mg via INTRAVENOUS

## 2020-11-20 MED ORDER — ACETAMINOPHEN 10 MG/ML IV SOLN: 1000.0000 mg | Freq: Once | INTRAVENOUS | Status: DC | PRN

## 2020-11-20 MED ORDER — HYDROMORPHONE HCL 1 MG/ML IJ SOLN
0.2500 mg | INTRAMUSCULAR | Status: DC | PRN
  Administered 2020-11-20: 0.5 mg via INTRAVENOUS

## 2020-11-20 SURGICAL SUPPLY — 15 items
CATH ROBINSON RED A/P 16FR (CATHETERS) IMPLANT
CATH SILICON 18FR 30CC (CATHETERS) ×2 IMPLANT
CONTAINER PREFILL 10% NBF 60ML (FORM) ×2 IMPLANT
GAUZE 4X4 16PLY RFD (DISPOSABLE) ×2 IMPLANT
GLOVE BIOGEL PI IND STRL 7.0 (GLOVE) ×2 IMPLANT
GLOVE BIOGEL PI INDICATOR 7.0 (GLOVE) ×2
GLOVE SURG LTX SZ7 (GLOVE) ×2 IMPLANT
GOWN STRL REUS W/TWL LRG LVL3 (GOWN DISPOSABLE) ×4 IMPLANT
KIT PROCEDURE FLUENT (KITS) ×2 IMPLANT
PACK VAGINAL MINOR WOMEN LF (CUSTOM PROCEDURE TRAY) ×2 IMPLANT
PAD OB MATERNITY 4.3X12.25 (PERSONAL CARE ITEMS) ×2 IMPLANT
PAD PREP 24X48 CUFFED NSTRL (MISCELLANEOUS) ×2 IMPLANT
SET GENESYS HTA PROCERVA (MISCELLANEOUS) ×2 IMPLANT
SLEEVE SCD COMPRESS KNEE MED (MISCELLANEOUS) ×2 IMPLANT
TOWEL GREEN STERILE FF (TOWEL DISPOSABLE) ×4 IMPLANT

## 2020-11-20 NOTE — Op Note (Signed)
Preoperative diagnosis: Menorrhagia, submucosal fibroid  Postoperative diagnosis: Same  Procedure: HTA endometrial ablation, hysteroscopy, D & C  Surgeon: Standley Dakins. Kennon Rounds, M.D.  Anesthesia: MAC Valma Cava, MD, Paracervical block  Findings: Normal appearing cervix and uterine cavity with both ostia seen.  Estimated blood loss: Minimal  Specimens: Endometrial curettings  Disposition of specimen: Pathology  Reason for procedure: Patient is a 44 y.o. [id number] with    Procedure: Patient was taken to the OR where she was placed in dorsal lithotomy in [place]. SCDs were in place. An adequate timeout was obtained. The patient was prepped and draped in the usual sterile fashion. A red rubber catheter is used to drain her bladder. A speculum was placed inside the vagina and the cervix visualized. The cervix was grasped anteriorly with a single-tooth tenaculum. 20 cc of quarter percent Marcaine were injected for paracervical block. Sequential dilation was done to a #8 dilator, and the HTA with hysteroscope was introduced into the uterine cavity. The cervix and endometrial lining appeared normal, there was a lot of blood in the cavity and ostia could not be seen. There was no deformity of the cavity. A seal test was done and was passed. The uterine cavity was heated to between 80 and 90C and HTA was performed for 10 minutes. Cooling was then performed and hysteroscopy continued, no further pathology was noted in the cavity, specifically no fibroid was noted and the instrument removed. Sharp curettage was then performed and sample sent to pathology. All instrument, needle, and lap counts were correct x2. The patient was awakened taken to recovery room in stable condition.  [name] MD 11/20/2020 1:23 PM

## 2020-11-20 NOTE — Transfer of Care (Signed)
Immediate Anesthesia Transfer of Care Note  Patient: Maria Bright  Procedure(s) Performed: DILATATION & CURETTAGE/HYSTEROSCOPY WITH HYDROTHERMAL ABLATION AND RESECTION OF FIBROID WITH MYOSURE (N/A Uterus)  Patient Location: PACU  Anesthesia Type:General  Level of Consciousness: drowsy, patient cooperative and responds to stimulation  Airway & Oxygen Therapy: Patient Spontanous Breathing and Patient connected to face mask oxygen  Post-op Assessment: Report given to RN and Post -op Vital signs reviewed and stable  Post vital signs: Reviewed and stable  Last Vitals:  Vitals Value Taken Time  BP    Temp    Pulse    Resp    SpO2      Last Pain:  Vitals:   11/20/20 1032  TempSrc:   PainSc: 5       Patients Stated Pain Goal: 2 (25/18/98 4210)  Complications: No complications documented.

## 2020-11-20 NOTE — Anesthesia Postprocedure Evaluation (Signed)
Anesthesia Post Note  Patient: Maria Bright  Procedure(s) Performed: DILATATION & CURETTAGE/HYSTEROSCOPY WITH HYDROTHERMAL ABLATION AND RESECTION OF FIBROID WITH MYOSURE (N/A Uterus)     Patient location during evaluation: PACU Anesthesia Type: General Level of consciousness: awake and alert Pain management: pain level controlled Vital Signs Assessment: post-procedure vital signs reviewed and stable Respiratory status: spontaneous breathing, nonlabored ventilation, respiratory function stable and patient connected to nasal cannula oxygen Cardiovascular status: blood pressure returned to baseline and stable Postop Assessment: no apparent nausea or vomiting Anesthetic complications: no   No complications documented.  Last Vitals:  Vitals:   11/20/20 1422 11/20/20 1430  BP: 115/73   Pulse: 60 67  Resp: 19 19  Temp: 36.7 C   SpO2: 94% 95%    Last Pain:  Vitals:   11/20/20 1407  TempSrc:   PainSc: [place]

## 2020-11-20 NOTE — Discharge Instructions (Signed)
Endometrial Ablation, Care After This sheet gives you information about how to care for yourself after your procedure. Your health care provider may also give you more specific instructions. If you have problems or questions, contact your health care provider. What can I expect after the procedure? After the procedure, it is common to have:  A need to urinate more frequently than usual for the first 24 hours.  Cramps similar to menstrual cramps. These may last for 1-2 days.  Thin, watery vaginal discharge that is light pink or brown in color. This may last a few weeks. Discharge will be heavy for the first few days after your procedure. You may need to wear a sanitary pad.  Nausea.  Vaginal bleeding for 4-6 weeks after the procedure, as tissue healing occurs. Follow these instructions at home: Activity  Do not drive for 24 hours if you were given amedicine to help you relax (sedative) during your procedure.  Do not have sex or put anything into your vagina until your health care provider approves.  Do not lift anything that is heavier than 10 lb (4.5 kg), or the limit that you are told, until your health care provider says that it is safe.  Return to your normal activities as told by your health care provider. Ask your health care provider what activities are safe for you. General instructions   Take over-the-counter and prescription medicines only as told by your health care provider.  Do not take baths, swim, or use a hot tub until your health care provider approves. You will be able to take showers.  Check your vaginal area every day for signs of infection. Check for: ? Redness, swelling, or pain. ? More discharge or blood, instead of less. ? Bad-smelling discharge.  Keep all follow-up visits as told by your health care provider. This is important.  Drink enough fluid to keep your urine pale yellow. Contact a health care provider if you have:  Vaginal redness, swelling, or  pain.  Vaginal discharge or bleeding that gets worse instead of getting better.  Bad-smelling vaginal discharge.  A fever or chills.  Trouble urinating. Get help right away if you have:  Heavy vaginal bleeding.  Severe cramps. Summary  After endometrial ablation, it is normal to have thin, watery vaginal discharge that is light pink or brown in color. This may last a few weeks and may be heavier right after the procedure.  Vaginal bleeding is also normal after the procedure and should get better with time.  Check your vaginal area every day for signs of infection, such as bad-smelling discharge.  Keep all follow-up visits as told by your health care provider. This is important. This information is not intended to replace advice given to you by your health care provider. Make sure you discuss any questions you have with your health care provider. Document Revised: 03/09/2019 Document Reviewed: 09/28/2017 Elsevier Patient Education  2020 [employer]  

## 2020-11-20 NOTE — Anesthesia Procedure Notes (Signed)
Procedure Name: LMA Insertion Date/Time: 11/20/2020 12:34 PM Performed by: Glynda Jaeger, CRNA Pre-anesthesia Checklist: Patient identified, Emergency Drugs available, Suction available, Timeout performed and Patient being monitored Patient Re-evaluated:Patient Re-evaluated prior to induction Oxygen Delivery Method: Circle system utilized Preoxygenation: Pre-oxygenation with 100% oxygen Induction Type: IV induction Ventilation: Mask ventilation without difficulty LMA: LMA with gastric port inserted LMA Size: 4.0 Number of attempts: 1 Tube secured with: Tape Dental Injury: Teeth and Oropharynx as per pre-operative assessment

## 2020-11-20 NOTE — OR Nursing (Signed)
Pt is awake,alert and oriented. Pt is in NAD at this time. Pt and family verbalized understanding of poc and discharge instructions. instructions given to family and reviewed prior to discharge.Pt is ambulatory to wheelchair with stand by assist but not needed with steady gait.  Pt taken to front lobby and placed in car with family.   

## 2020-11-20 NOTE — Interval H&P Note (Signed)
History and Physical Interval Note:  11/20/2020 11:55 AM  Maria Bright  has presented today for surgery, with the diagnosis of AUB.  The various methods of treatment have been discussed with the patient and family. After consideration of risks, benefits and other options for treatment, the patient has consented to  Procedure(s): DILATATION & CURETTAGE/HYSTEROSCOPY WITH HYDROTHERMAL ABLATION AND RESECTION OF FIBROID WITH MYOSURE (N/A) as a surgical intervention.  The patient's history has been reviewed, patient examined, no change in status, stable for surgery.  I have reviewed the patient's chart and labs.  Questions were answered to the patient's satisfaction.     [name]

## 2020-11-21 ENCOUNTER — Encounter (HOSPITAL_COMMUNITY): Payer: Self-pay | Admitting: Family Medicine

## 2020-11-21 LAB — SURGICAL PATHOLOGY

## 2020-11-26 ENCOUNTER — Other Ambulatory Visit: Payer: Self-pay

## 2020-11-26 ENCOUNTER — Ambulatory Visit (INDEPENDENT_AMBULATORY_CARE_PROVIDER_SITE_OTHER): Payer: BC Managed Care – PPO | Admitting: Internal Medicine

## 2020-11-26 ENCOUNTER — Encounter: Payer: Self-pay | Admitting: Internal Medicine

## 2020-11-26 DIAGNOSIS — I10 Essential (primary) hypertension: Secondary | ICD-10-CM

## 2020-11-26 MED ORDER — EPINEPHRINE 0.3 MG/0.3ML IJ SOAJ: 0.3000 mg | INTRAMUSCULAR | 2 refills | Status: DC | PRN

## 2020-11-26 MED ORDER — BUPROPION HCL ER (XL) 300 MG PO TB24: 300.0000 mg | ORAL_TABLET | Freq: Every day | ORAL | 1 refills | Status: DC

## 2020-11-26 MED ORDER — LISDEXAMFETAMINE DIMESYLATE 20 MG PO CAPS: 20.0000 mg | ORAL_CAPSULE | Freq: Two times a day (BID) | ORAL | 0 refills | Status: DC

## 2020-11-26 NOTE — Assessment & Plan Note (Signed)
Currently controlled off meds Reinforced DASH diet and exercise for weight loss We will monitor

## 2020-11-26 NOTE — Patient Instructions (Signed)

## 2020-11-26 NOTE — Progress Notes (Signed)
Subjective:    Patient ID: Maria Bright, female    DOB: [birth year], 44 y.o.   MRN: [id number]  HPI  Patient presents the clinic today with concern for elevated blood pressure.  She reports over the last few weeks her blood pressure has been ranging 140-150/80-90.  She reports during this time she was dealing with heavy vaginal bleeding, undergoing consultation for uterine ablation, with development of complex cyst that has been evaluated by oncology.  She reports over the past few weeks she has been in a lot of pain and under a lot of stress and thinks this could be why her blood pressure has been so elevated.  She reports checking her blood pressure yesterday and it was 120s/70s.  Her BP today is 120/80  She also needs refills of Vyvanse, Epipen and Wellbutrin.   Review of Systems      Past Medical History:  Diagnosis Date  . Abnormal uterine bleeding   . ADHD (attention deficit hyperactivity disorder)   . Anxiety   . Asthma   . Complication of anesthesia    30 years ago ; woke up in middle of wisdom tooth extraction   . Depression   . DVT, lower extremity, recurrent, right ([place]) 07/06/2020  . GERD (gastroesophageal reflux disease)   . Hypertension    never has been treated with medications  . Ovarian cyst   . Polycystic disease, ovaries    POC   . Seasonal allergies   . Sleep apnea    CPAP   . Uterine fibroid     Current Outpatient Medications  Medication Sig Dispense Refill  . acetaminophen (TYLENOL) 500 MG tablet Take 1,000 mg by mouth every 6 (six) hours as needed for mild pain.    Marland Kitchen albuterol (VENTOLIN HFA) 108 (90 Base) MCG/ACT inhaler Inhale 2 puffs into the lungs every 6 (six) hours as needed for wheezing or shortness of breath. 8 g 5  . cetirizine (ZYRTEC) 10 MG tablet Take 10 mg by mouth at bedtime.    Marland Kitchen desvenlafaxine (PRISTIQ) 100 MG 24 hr tablet TAKE 1 TABLET (100 MG TOTAL) BY MOUTH EVERY EVENING. (Patient taking differently: Take 100 mg by mouth at  bedtime.) 90 tablet 0  . diphenhydrAMINE (BENADRYL) 25 MG tablet Take 50 mg by mouth every 6 (six) hours as needed for itching or allergies.    . fluticasone (FLONASE) 50 MCG/ACT nasal spray Place 2 sprays into both nostrils daily. (Patient taking differently: Place 2 sprays into both nostrils daily as needed for allergies or rhinitis.) 16 g 6  . Fluticasone Furoate (ARNUITY ELLIPTA) 100 MCG/ACT AEPB Take 1 Inhaler by mouth daily.    Marland Kitchen gabapentin (NEURONTIN) 300 MG capsule Take 300-600 mg by mouth See admin instructions. Take 300mg  in the AM and 600mg  at bedtime. Take additional 300mg  at midday if needed for pain.    Marland Kitchen ibuprofen (ADVIL) 800 MG tablet Take 800 mg by mouth every 8 (eight) hours as needed (pain).    . metaxalone (SKELAXIN) 800 MG tablet Take 1 tablet (800 mg total) by mouth daily as needed for muscle spasms. 30 tablet 0  . norethindrone (AYGESTIN) 5 MG tablet Take 1 tablet (5 mg total) by mouth daily. 30 tablet 2  . olopatadine (PATANOL) 0.1 % ophthalmic solution Place 1 drop into both eyes 2 (two) times daily as needed for allergies.    Marland Kitchen omeprazole (PRILOSEC OTC) 20 MG tablet Take 20 mg by mouth daily as needed (heartburn).    Marland Kitchen  oxyCODONE (OXY IR/ROXICODONE) 5 MG immediate release tablet Take 1 tablet (5 mg total) by mouth every 6 (six) hours as needed for severe pain. 5 tablet 0  . propranolol (INDERAL) 10 MG tablet TAKE 1 TABLET (10 MG TOTAL) BY MOUTH DAILY AS NEEDED (ANXIETY). ANXIETY (Patient taking differently: Take 10 mg by mouth daily as needed (anxiety).) 90 tablet 0  . XARELTO 20 MG TABS tablet Take 20 mg by mouth daily.    Marland Kitchen buPROPion (WELLBUTRIN XL) 300 MG 24 hr tablet Take 1 tablet (300 mg total) by mouth daily. 90 tablet 1  . EPINEPHrine 0.3 mg/0.3 mL IJ SOAJ injection Inject 0.3 mg into the muscle as needed for anaphylaxis. 1 each 2  . lisdexamfetamine (VYVANSE) 20 MG capsule Take 1 capsule (20 mg total) by mouth 2 (two) times daily. 60 capsule 0   No current  facility-administered medications for this visit.    Allergies  Allergen Reactions  . Morphine And Related Nausea And Vomiting       . Adhesive [Tape] Rash    blisters  . Nickel Rash    Family History  Problem Relation Age of Onset  . Arthritis Mother   . Hyperlipidemia Mother   . Heart disease Mother   . Hypertension Mother   . Rheum arthritis Maternal Aunt   . Hyperlipidemia Paternal Aunt   . Diabetes Paternal Aunt   . Rheum arthritis Maternal Grandmother   . Heart disease Maternal Grandmother   . Arthritis Maternal Grandfather   . Hyperlipidemia Maternal Grandfather   . Heart disease Maternal Grandfather   . Hypertension Maternal Grandfather   . Prostate cancer Paternal Uncle   . Colon cancer Neg Hx   . Breast cancer Neg Hx   . Pancreatic cancer Neg Hx   . Endometrial cancer Neg Hx   . Ovarian cancer Neg Hx     Social History   Socioeconomic History  . Marital status: Married    Spouse name: Not on file  . Number of children: Not on file  . Years of education: Not on file  . Highest education level: Not on file  Occupational History  . Not on file  Tobacco Use  . Smoking status: Never Smoker  . Smokeless tobacco: Never Used  Vaping Use  . Vaping Use: Never used  Substance and Sexual Activity  . Alcohol use: No    Comment: seldom   . Drug use: No  . Sexual activity: Yes    Birth control/protection: None, Surgical    Comment: hUSBAND VASECTOMY  Other Topics Concern  . Not on file  Social History Narrative  . Not on file   Social Determinants of Health   Financial Resource Strain: Not on file  Food Insecurity: Not on file  Transportation Needs: Not on file  Physical Activity: Not on file  Stress: Not on file  Social Connections: Not on file  Intimate Partner Violence: Not on file     Constitutional: Denies fever, malaise, fatigue, headache or abrupt weight changes.  Respiratory: Denies difficulty breathing, shortness of breath, cough or  sputum production.   Cardiovascular: Denies chest pain, chest tightness, palpitations or swelling in the hands or feet.  Neurological: Denies dizziness, difficulty with memory, difficulty with speech or problems with balance and coordination.    No other specific complaints in a complete review of systems (except as listed in HPI above).  Objective:   Physical Exam   BP 120/80 (BP Location: Right Arm, Patient Position: Sitting, Cuff  Size: Large)   Pulse 96   Temp 98.4 F (36.9 C) (Temporal)   Ht 5\' 6"  (1.676 m)   Wt (!) 318 lb 12.8 oz (144.6 kg)   SpO2 99%   BMI 51.46 kg/m  Wt Readings from Last 3 Encounters:  11/26/20 (!) 318 lb 12.8 oz (144.6 kg)  11/20/20 (!) 315 lb (142.9 kg)  11/13/20 (!) 313 lb (142 kg)    General: Appears her stated age, obese, in NAD. Cardiovascular: Normal rate. Pulmonary/Chest: Normal effort. Neurological: Alert and oriented.    BMET    Component Value Date/Time   NA 136 11/20/2020 1010   K 4.1 11/20/2020 1010   CL 107 11/20/2020 1010   CO2 20 (L) 11/20/2020 1010   GLUCOSE 90 11/20/2020 1010   BUN 12 11/20/2020 1010   CREATININE 1.11 (H) 11/20/2020 1010   CREATININE 1.01 06/14/2020 1637   CALCIUM 8.9 11/20/2020 1010   GFRNONAA >60 11/20/2020 1010   GFRAA >60 06/26/2020 1701    Lipid Panel     Component Value Date/Time   CHOL 224 (H) 02/15/2020 1514   TRIG 96.0 02/15/2020 1514   HDL 59.30 02/15/2020 1514   CHOLHDL 4 02/15/2020 1514   VLDL 19.2 02/15/2020 1514   LDLCALC 145 (H) 02/15/2020 1514    CBC    Component Value Date/Time   WBC 6.4 11/20/2020 1010   RBC 4.43 11/20/2020 1010   HGB 12.6 11/20/2020 1010   HCT 39.2 11/20/2020 1010   PLT 266 11/20/2020 1010   MCV 88.5 11/20/2020 1010   MCH 28.4 11/20/2020 1010   MCHC 32.1 11/20/2020 1010   RDW 13.1 11/20/2020 1010   LYMPHSABS 2.2 06/26/2020 1701   MONOABS 1.1 (H) 06/26/2020 1701   EOSABS 0.3 06/26/2020 1701   BASOSABS 0.1 06/26/2020 1701    Hgb A1C Lab Results   Component Value Date   HGBA1C 5.3 06/14/2020           Assessment & Plan:

## 2020-11-30 ENCOUNTER — Encounter: Payer: Self-pay | Admitting: Internal Medicine

## 2020-12-02 ENCOUNTER — Emergency Department (HOSPITAL_COMMUNITY)
Admission: EM | Admit: 2020-12-02 | Discharge: 2020-12-02 | Disposition: A | Discharge status: Home / Self Care | Payer: BC Managed Care – PPO | Attending: Emergency Medicine | Admitting: Emergency Medicine

## 2020-12-02 ENCOUNTER — Other Ambulatory Visit: Payer: Self-pay | Admitting: Family Medicine

## 2020-12-02 ENCOUNTER — Encounter (HOSPITAL_COMMUNITY): Payer: Self-pay | Admitting: Pediatrics

## 2020-12-02 DIAGNOSIS — I82409 Acute embolism and thrombosis of unspecified deep veins of unspecified lower extremity: Secondary | ICD-10-CM | POA: Insufficient documentation

## 2020-12-02 DIAGNOSIS — Z7901 Long term (current) use of anticoagulants: Secondary | ICD-10-CM | POA: Insufficient documentation

## 2020-12-02 DIAGNOSIS — R109 Unspecified abdominal pain: Secondary | ICD-10-CM | POA: Insufficient documentation

## 2020-12-02 DIAGNOSIS — Z5321 Procedure and treatment not carried out due to patient leaving prior to being seen by health care provider: Secondary | ICD-10-CM | POA: Insufficient documentation

## 2020-12-02 DIAGNOSIS — R509 Fever, unspecified: Secondary | ICD-10-CM | POA: Diagnosis not present

## 2020-12-02 LAB — CBC
HCT: 39.9 % (ref 36.0–46.0)
Hemoglobin: 12.6 g/dL (ref 12.0–15.0)
MCH: 27.8 pg (ref 26.0–34.0)
MCHC: 31.6 g/dL (ref 30.0–36.0)
MCV: 87.9 fL (ref 80.0–100.0)
Platelets: 390 10*3/uL (ref 150–400)
RBC: 4.54 MIL/uL (ref 3.87–5.11)
RDW: 13.7 % (ref 11.5–15.5)
WBC: 15.5 10*3/uL — ABNORMAL HIGH (ref 4.0–10.5)
nRBC: 0 % (ref 0.0–0.2)

## 2020-12-02 LAB — I-STAT BETA HCG BLOOD, ED (MC, WL, AP ONLY): I-stat hCG, quantitative: 7.4 m[IU]/mL — ABNORMAL HIGH (ref <5)

## 2020-12-02 LAB — COMPREHENSIVE METABOLIC PANEL
ALT: 41 U/L (ref 0–44)
AST: 28 U/L (ref 15–41)
Albumin: 3.4 g/dL — ABNORMAL LOW (ref 3.5–5.0)
Alkaline Phosphatase: 78 U/L (ref 38–126)
Anion gap: 11 (ref 5–15)
BUN: 9 mg/dL (ref 6–20)
CO2: 22 mmol/L (ref 22–32)
Calcium: 8.9 mg/dL (ref 8.9–10.3)
Chloride: 101 mmol/L (ref 98–111)
Creatinine, Ser: 1.14 mg/dL — ABNORMAL HIGH (ref 0.44–1.00)
GFR, Estimated: >60 mL/min (ref >60)
Glucose, Bld: 93 mg/dL (ref 70–99)
Potassium: 3.5 mmol/L (ref 3.5–5.1)
Sodium: 134 mmol/L — ABNORMAL LOW (ref 135–145)
Total Bilirubin: 0.8 mg/dL (ref 0.3–1.2)
Total Protein: 7.9 g/dL (ref 6.5–8.1)

## 2020-12-02 LAB — LIPASE, BLOOD: Lipase: 21 U/L (ref 11–51)

## 2020-12-02 MED ORDER — DOXYCYCLINE HYCLATE 100 MG PO CAPS: 100.0000 mg | ORAL_CAPSULE | Freq: Two times a day (BID) | ORAL | 0 refills | Status: DC

## 2020-12-02 NOTE — ED Triage Notes (Signed)
Patient c/o severe abdominal pain. Stated was seen on Saturday at another facility and was dx w/ bladder infection. Here today d/t unresolved symptoms and worsening pain. C/O sharp pain when taking a deep breath. Reported hx of endometrial ablation, chole and DVT and is currently on xarelto.

## 2020-12-02 NOTE — Progress Notes (Signed)
rx for presumptive infection

## 2020-12-03 ENCOUNTER — Encounter: Payer: Self-pay | Admitting: Family Medicine

## 2020-12-03 ENCOUNTER — Other Ambulatory Visit: Payer: Self-pay

## 2020-12-03 ENCOUNTER — Ambulatory Visit (INDEPENDENT_AMBULATORY_CARE_PROVIDER_SITE_OTHER): Payer: BC Managed Care – PPO | Admitting: Family Medicine

## 2020-12-03 VITALS — BP 175/95 | HR 110 | Temp 99.0°F | Wt 312.2 lb

## 2020-12-03 DIAGNOSIS — Z4889 Encounter for other specified surgical aftercare: Secondary | ICD-10-CM

## 2020-12-03 DIAGNOSIS — N719 Inflammatory disease of uterus, unspecified: Secondary | ICD-10-CM

## 2020-12-03 DIAGNOSIS — N3000 Acute cystitis without hematuria: Secondary | ICD-10-CM

## 2020-12-03 NOTE — Progress Notes (Signed)
   Subjective:    Patient ID: Maria Bright is a 45 y.o. female presenting with No chief complaint on file.  on 12/03/2020  HPI: Pt. Is s/p HTA on 12/22. Since that time, has had worsening abdominal pain, fever, cramping and bloody vaginal discharge. She was seen in the [facility] ED with CT scanning, which showed air in the uterus, small amount of free fluid, the ovarian cyst and a UTI. There was no abnormality with her bowels. Culture from that visit reveals E.coli and another Gram Positive organism. She reports some nausea, no emesis, some constipation. Went to ED again on 1/3 but wait was too long. Her fever broke 2 days ago. Was started on Keflex, and began Doxy, which I called in, in case there was some endometritis. Reports severe abdominal pain with speed bumps.  Review of Systems  Constitutional: Positive for fever. Negative for chills.  Respiratory: Negative for shortness of breath.   Cardiovascular: Negative for chest pain.  Gastrointestinal: Positive for abdominal pain and nausea. Negative for vomiting.  Genitourinary: Negative for dysuria.  Skin: Negative for rash.      Objective:    BP (!) 175/95   Pulse (!) 110   Temp 99 F (37.2 C)   Wt (!) 312 lb 3.2 oz (141.6 kg)   BMI 50.39 kg/m  Physical Exam Constitutional:      General: She is not in acute distress.    Appearance: She is well-developed and well-nourished.  HENT:     Head: Normocephalic and atraumatic.  Eyes:     General: No scleral icterus. Cardiovascular:     Rate and Rhythm: Normal rate.  Pulmonary:     Effort: Pulmonary effort is normal.  Abdominal:     General: There is no distension.     Palpations: Abdomen is soft.     Tenderness: There is abdominal tenderness.  Genitourinary:    Comments: Tissue and blood noted in vault, diffusely tender pelvis Musculoskeletal:     Cervical back: Neck supple.     Comments: No CVA tenderness  Skin:    General: Skin is warm and dry.  Neurological:      Mental Status: She is alert and oriented to person, place, and time.  Psychiatric:        Mood and Affect: Mood and affect normal.         Assessment & Plan:  Encounter for postoperative care  Endometritis - vs. PID, given tenderness, add doxy--consider addition of Flagyl if not improving.  Acute cystitis without hematuria - continue keflex given 2 bacteria present.   Return in about 1 week (around 12/10/2020).  Reva Bores 12/04/2020 8:09 AM

## 2020-12-04 ENCOUNTER — Encounter: Payer: Self-pay | Admitting: Family Medicine

## 2020-12-19 ENCOUNTER — Ambulatory Visit (INDEPENDENT_AMBULATORY_CARE_PROVIDER_SITE_OTHER): Payer: BC Managed Care – PPO | Admitting: Family Medicine

## 2020-12-19 ENCOUNTER — Encounter: Payer: Self-pay | Admitting: Family Medicine

## 2020-12-19 ENCOUNTER — Other Ambulatory Visit: Payer: Self-pay

## 2020-12-19 VITALS — BP 136/81 | HR 93

## 2020-12-19 DIAGNOSIS — N83201 Unspecified ovarian cyst, right side: Secondary | ICD-10-CM

## 2020-12-19 DIAGNOSIS — Z86718 Personal history of other venous thrombosis and embolism: Secondary | ICD-10-CM

## 2020-12-19 DIAGNOSIS — B354 Tinea corporis: Secondary | ICD-10-CM | POA: Diagnosis not present

## 2020-12-19 DIAGNOSIS — Z4889 Encounter for other specified surgical aftercare: Secondary | ICD-10-CM

## 2020-12-19 DIAGNOSIS — N920 Excessive and frequent menstruation with regular cycle: Secondary | ICD-10-CM

## 2020-12-19 MED ORDER — NYSTATIN 100000 UNIT/GM EX POWD: 1.0000 "application " | Freq: Three times a day (TID) | CUTANEOUS | 0 refills | Status: DC

## 2020-12-19 NOTE — Assessment & Plan Note (Signed)
Continue blood thinners, to f/u with vein and vascular soon.

## 2020-12-19 NOTE — Progress Notes (Signed)
    Subjective:    Patient ID: Maria Bright is a 45 y.o. female presenting with Follow-up  on 12/19/2020  HPI: Seen following HTA with UTI and possible endometritis. S/p completion of Abx. Went back to work on last Monday and feeling much better. Stopped bleeding over the weekend. No further fevers. Has some itching tenderness under her breasts. Took a Diflucan for vaginal yeast, and using Lotrimin which is helping some, but not enough.   Review of Systems  Constitutional: Negative for chills and fever.  Respiratory: Negative for shortness of breath.   Cardiovascular: Negative for chest pain.  Gastrointestinal: Negative for abdominal pain, nausea and vomiting.  Genitourinary: Negative for dysuria.  Skin: Negative for rash.      Objective:    BP 136/81   Pulse 93  Physical Exam Constitutional:      General: She is not in acute distress.    Appearance: She is well-developed and well-nourished.  HENT:     Head: Normocephalic and atraumatic.  Eyes:     General: No scleral icterus. Cardiovascular:     Rate and Rhythm: Normal rate.  Pulmonary:     Effort: Pulmonary effort is normal.  Abdominal:     Palpations: Abdomen is soft.  Musculoskeletal:     Cervical back: Neck supple.  Skin:    General: Skin is warm and dry.  Neurological:     Mental Status: She is alert and oriented to person, place, and time.  Psychiatric:        Mood and Affect: Mood and affect normal.         Assessment & Plan:   Problem List Items Addressed This Visit      Unprioritized   Cyst of right ovary - Primary    Still on blood thinners, and just getting over her illness. Will repeat u/s in 3 months with s/u then to decide next steps.      Relevant Orders   US PELVIS (TRANSABDOMINAL ONLY)   History of DVT (deep vein thrombosis)    Continue blood thinners, to f/u with vein and vascular soon.       Other Visit Diagnoses    Menorrhagia with regular cycle       improved and no further  bleeding   Tinea corporis       following Abx use--trial of nystatin powder   Relevant Medications   nystatin (MYCOSTATIN/NYSTOP) powder   Encounter for postoperative care       feeling much better, infection treated, pain is resolved. Bleeding is resolved.      Total time in review of prior notes, pathology, labs, history taking, review with patient, exam, note writing, discussion of options, plan for next steps, alternatives and risks of treatment: 21 minutes.  Return in about 3 months (around 03/19/2021).  [name] 12/19/2020 10:29 AM

## 2020-12-19 NOTE — Assessment & Plan Note (Signed)
Still on blood thinners, and just getting over her illness. Will repeat u/s in 3 months with s/u then to decide next steps.

## 2020-12-19 NOTE — Progress Notes (Signed)
Doing much better

## 2020-12-28 ENCOUNTER — Other Ambulatory Visit: Payer: Self-pay | Admitting: Internal Medicine

## 2020-12-28 ENCOUNTER — Other Ambulatory Visit: Payer: Self-pay | Admitting: Family Medicine

## 2020-12-28 DIAGNOSIS — N920 Excessive and frequent menstruation with regular cycle: Secondary | ICD-10-CM

## 2021-01-02 ENCOUNTER — Telehealth: Payer: BC Managed Care – PPO | Admitting: Family Medicine

## 2021-01-02 DIAGNOSIS — H5789 Other specified disorders of eye and adnexa: Secondary | ICD-10-CM | POA: Diagnosis not present

## 2021-01-02 MED ORDER — ERYTHROMYCIN 5 MG/GM OP OINT: 1.0000 "application " | TOPICAL_OINTMENT | Freq: Three times a day (TID) | OPHTHALMIC | 0 refills | Status: DC

## 2021-01-02 NOTE — Patient Instructions (Signed)
-  I sent the medication(s) we discussed to your pharmacy: Meds ordered this encounter  Medications  . erythromycin ophthalmic ointment    Sig: Place 1 application into both eyes 3 (three) times daily.    Dispense:  3.5 g    Refill:  0     I hope you are feeling better soon!  Seek in person care promptly if your symptoms worsen, new concerns arise or you are not improving with treatment.  It was nice to meet you today. I help [place] out with telemedicine visits on Tuesdays and Thursdays and am available for visits on those days. If you have any concerns or questions following this visit please schedule a follow up visit with your Primary Care doctor or seek care at a local urgent care clinic to avoid delays in care.   

## 2021-01-02 NOTE — Progress Notes (Signed)
Virtual Visit via Video Note  I connected with Maria Bright  on 01/02/21 at  4:20 PM EST by a video enabled telemedicine application and verified that I am speaking with the correct person using two identifiers.  Location patient: home, [place] Location provider:work or home office Persons participating in the virtual visit: patient, provider  I discussed the limitations of evaluation and management by telemedicine and the availability of in person appointments. The patient expressed understanding and agreed to proceed.   HPI:  Acute telemedicine visit for eye issues: -Onset: for 2-3 weeks -has a lot of allergies - using oral antihistamine and allergy eye drops and saline drops in the eyes and compresses -Symptoms include: R > [place] now with irritation, a little crusting in the morning and a little drainage most days, eyes are itchy -Denies: sinus issues, fevers, eye pain, visual loss, headaches, new eye make up, known pink eye exposure -Has tried:compresses, allergy eye drops, saline eye drops, allergy pill -Pertinent medication allergies:denies - morphine, nickel and tape listed   ROS: See pertinent positives and negatives per HPI.  Past Medical History:  Diagnosis Date  . Abnormal uterine bleeding   . ADHD (attention deficit hyperactivity disorder)   . Anxiety   . Asthma   . Complication of anesthesia    30 years ago ; woke up in middle of wisdom tooth extraction   . Depression   . DVT, lower extremity, recurrent, right ([place]) 07/06/2020  . GERD (gastroesophageal reflux disease)   . Hypertension    never has been treated with medications  . Ovarian cyst   . Polycystic disease, ovaries    POC   . Seasonal allergies   . Sleep apnea    CPAP   . Uterine fibroid     Past Surgical History:  Procedure Laterality Date  . ANTERIOR CERVICAL DECOMP/DISCECTOMY FUSION  06/24/2020   C5-6 ACDF (Dr. Ivan Croft)  . CHOLECYSTECTOMY N/A 06/30/2017   Procedure: LAPAROSCOPIC CHOLECYSTECTOMY;   Surgeon: Kinsinger, Arta Bruce, MD;  Location: [facility];  Service: General;  Laterality: N/A;  . DILITATION & CURRETTAGE/HYSTROSCOPY WITH HYDROTHERMAL ABLATION N/A 11/20/2020   Procedure: DILATATION & CURETTAGE/HYSTEROSCOPY WITH HYDROTHERMAL ABLATION AND RESECTION OF FIBROID WITH MYOSURE;  Surgeon: [name], MD;  Location: [facility];  Service: Gynecology;  Laterality: N/A;  . WISDOM TOOTH EXTRACTION       Current Outpatient Medications:  .  erythromycin ophthalmic ointment, Place 1 application into both eyes 3 (three) times daily., Disp: 3.5 g, Rfl: 0 .  acetaminophen (TYLENOL) 500 MG tablet, Take 1,000 mg by mouth every 6 (six) hours as needed for mild pain., Disp: , Rfl:  .  albuterol (VENTOLIN HFA) 108 (90 Base) MCG/ACT inhaler, Inhale 2 puffs into the lungs every 6 (six) hours as needed for wheezing or shortness of breath. (Patient not taking: Reported on 12/03/2020), Disp: 8 g, Rfl: 5 .  buPROPion (WELLBUTRIN XL) 300 MG 24 hr tablet, Take 1 tablet (300 mg total) by mouth daily., Disp: 90 tablet, Rfl: 1 .  cetirizine (ZYRTEC) 10 MG tablet, Take 10 mg by mouth at bedtime., Disp: , Rfl:  .  desvenlafaxine (PRISTIQ) 100 MG 24 hr tablet, TAKE 1 TABLET (100 MG TOTAL) BY MOUTH EVERY EVENING. (Patient taking differently: Take 100 mg by mouth at bedtime.), Disp: 90 tablet, Rfl: 0 .  diphenhydrAMINE (BENADRYL) 25 MG tablet, Take 50 mg by mouth every 6 (six) hours as needed for itching or allergies., Disp: , Rfl:  .  doxycycline (VIBRAMYCIN) 100  MG capsule, Take 1 capsule (100 mg total) by mouth 2 (two) times daily., Disp: 20 capsule, Rfl: 0 .  EPINEPHrine 0.3 mg/0.3 mL IJ SOAJ injection, Inject 0.3 mg into the muscle as needed for anaphylaxis., Disp: 1 each, Rfl: 2 .  fluticasone (FLONASE) 50 MCG/ACT nasal spray, Place 2 sprays into both nostrils daily. (Patient taking differently: Place 2 sprays into both nostrils daily as needed for allergies or rhinitis.), Disp: 16 g, Rfl: 6 .  Fluticasone Furoate  (ARNUITY ELLIPTA) 100 MCG/ACT AEPB, Take 1 Inhaler by mouth daily., Disp: , Rfl:  .  gabapentin (NEURONTIN) 300 MG capsule, Take 300-600 mg by mouth See admin instructions. Take 300mg  in the AM and 600mg  at bedtime. Take additional 300mg  at midday if needed for pain. (Patient not taking: Reported on 12/19/2020), Disp: , Rfl:  .  ibuprofen (ADVIL) 800 MG tablet, Take 800 mg by mouth every 8 (eight) hours as needed (pain)., Disp: , Rfl:  .  lisdexamfetamine (VYVANSE) 20 MG capsule, Take 1 capsule (20 mg total) by mouth 2 (two) times daily., Disp: 60 capsule, Rfl: 0 .  metaxalone (SKELAXIN) 800 MG tablet, Take 1 tablet (800 mg total) by mouth daily as needed for muscle spasms., Disp: 30 tablet, Rfl: 0 .  norethindrone (AYGESTIN) 5 MG tablet, Take 1 tablet (5 mg total) by mouth daily. (Patient not taking: Reported on 12/19/2020), Disp: 30 tablet, Rfl: 2 .  nystatin (MYCOSTATIN/NYSTOP) powder, Apply 1 application topically 3 (three) times daily., Disp: 15 g, Rfl: 0 .  olopatadine (PATANOL) 0.1 % ophthalmic solution, Place 1 drop into both eyes 2 (two) times daily as needed for allergies. (Patient not taking: Reported on 12/03/2020), Disp: , Rfl:  .  omeprazole (PRILOSEC OTC) 20 MG tablet, Take 20 mg by mouth daily as needed (heartburn). (Patient not taking: Reported on 12/03/2020), Disp: , Rfl:  .  oxyCODONE (OXY IR/ROXICODONE) 5 MG immediate release tablet, Take 1 tablet (5 mg total) by mouth every 6 (six) hours as needed for severe pain., Disp: 5 tablet, Rfl: 0 .  propranolol (INDERAL) 10 MG tablet, TAKE 1 TABLET (10 MG TOTAL) BY MOUTH DAILY AS NEEDED (ANXIETY). ANXIETY, Disp: 90 tablet, Rfl: 0 .  XARELTO 20 MG TABS tablet, Take 20 mg by mouth daily., Disp: , Rfl:   EXAM:  VITALS per patient if applicable:  GENERAL: alert, oriented, appears well and in no acute distress  HEENT: Exam limited somewhat due to poor video quality, conjunctive a seems to be fairly clear, mild erythema of the inner lids, no  significant discharge or crusting on this exam  NECK: normal movements of the head and neck  LUNGS: on inspection no signs of respiratory distress, breathing rate appears normal, no obvious gross SOB, gasping or wheezing  CV: no obvious cyanosis  MS: moves all visible extremities without noticeable abnormality  PSYCH/NEURO: pleasant and cooperative, no obvious depression or anxiety, speech and thought processing grossly intact  ASSESSMENT AND PLAN:  Discussed the following assessment and plan:  Eye irritation  -we discussed possible serious and likely etiologies, options for evaluation and workup, limitations of telemedicine visit vs in person visit, treatment, treatment risks and precautions. Pt prefers to treat via telemedicine empirically rather than in person at this moment.  Given the duration of symptoms and reported discharge/crusting, opted to treat with erythromycin ophthalmic ointment.  Advised in person evaluation if worsening, new symptoms arise, or if is not improving with treatment. Discussed options for inperson care if PCP office not available.  Did let this patient know that I only do telemedicine on Tuesdays and Thursdays for [place]. Advised to schedule follow up visit with PCP or [facility] if any further questions or concerns to avoid delays in care.   I discussed the assessment and treatment plan with the patient. The patient was provided an opportunity to ask questions and all were answered. The patient agreed with the plan and demonstrated an understanding of the instructions.     Lucretia Kern, DO

## 2021-01-08 ENCOUNTER — Other Ambulatory Visit: Payer: Self-pay | Admitting: Internal Medicine

## 2021-02-13 ENCOUNTER — Encounter: Payer: Self-pay | Admitting: Internal Medicine

## 2021-02-14 ENCOUNTER — Other Ambulatory Visit: Payer: Self-pay

## 2021-02-14 MED ORDER — LISDEXAMFETAMINE DIMESYLATE 20 MG PO CAPS: 20.0000 mg | ORAL_CAPSULE | Freq: Two times a day (BID) | ORAL | 0 refills | Status: DC

## 2021-02-14 NOTE — Telephone Encounter (Signed)
Last filled 11/26/2020... please advise  

## 2021-03-30 ENCOUNTER — Other Ambulatory Visit: Payer: Self-pay | Admitting: Internal Medicine

## 2021-04-01 NOTE — Telephone Encounter (Signed)
Attempted to contact patient to see if she would be following provider but no answer and vm is full. Review for courtesy supply

## 2021-04-06 ENCOUNTER — Other Ambulatory Visit: Payer: Self-pay | Admitting: Internal Medicine

## 2021-04-07 ENCOUNTER — Other Ambulatory Visit: Payer: Self-pay | Admitting: Internal Medicine

## 2021-04-07 ENCOUNTER — Other Ambulatory Visit: Payer: Self-pay | Admitting: Family

## 2021-04-07 DIAGNOSIS — Z1231 Encounter for screening mammogram for malignant neoplasm of breast: Secondary | ICD-10-CM

## 2021-04-07 MED ORDER — DESVENLAFAXINE SUCCINATE ER 100 MG PO TB24: 100.0000 mg | ORAL_TABLET | Freq: Every evening | ORAL | 0 refills | Status: DC

## 2021-04-07 NOTE — Telephone Encounter (Signed)
Pristiq last filled 01/08/2021 90 day supply   Vyvanse last filled 02/14/2021, to my understanding, pt does not take when not at work or weekends if I may recall... pt plans on staying at the practice...Marland Kitchen please advise

## 2021-04-08 ENCOUNTER — Encounter: Payer: Self-pay | Admitting: Internal Medicine

## 2021-04-08 NOTE — Telephone Encounter (Signed)
Please schedule virtual visit for tomorrow, around noon if you can

## 2021-04-09 ENCOUNTER — Telehealth (INDEPENDENT_AMBULATORY_CARE_PROVIDER_SITE_OTHER): Payer: BC Managed Care – PPO | Admitting: Internal Medicine

## 2021-04-09 DIAGNOSIS — U071 COVID-19: Secondary | ICD-10-CM

## 2021-04-09 DIAGNOSIS — J4520 Mild intermittent asthma, uncomplicated: Secondary | ICD-10-CM | POA: Diagnosis not present

## 2021-04-09 MED ORDER — NIRMATRELVIR/RITONAVIR (PAXLOVID)TABLET: ORAL_TABLET | ORAL | 0 refills | Status: DC

## 2021-04-09 MED ORDER — PREDNISONE 20 MG PO TABS: 20.0000 mg | ORAL_TABLET | Freq: Every day | ORAL | 0 refills | Status: DC

## 2021-04-09 NOTE — Progress Notes (Signed)
Virtual Visit via Video Note  I connected with Maria Bright on 04/09/21 at 11:20 AM EDT by a video enabled telemedicine application and verified that I am speaking with the correct person using two identifiers.  Location: Patient: Home Provider: Office  Person's participating in this video call: [name] NP and Maria Bright.   I discussed the limitations of evaluation and management by telemedicine and the availability of in person appointments. The patient expressed understanding and agreed to proceed.  History of Present Illness:  Patient reports headache, facial pain and pressure, nasal congestion, ear fullness, cough and shortness of breath.  She reports this started 3 days ago.  The headache is located in her face.  She describes the pain as pressure.  She denies headaches or visual changes.  She is not blowing any mucus out of her nose.  The cough is mostly nonproductive.  She felt like she is run a fever but has been been unable to check her temperature.  She reports chills and body aches.  She denies runny nose, sore throat, chest pain, nausea, vomiting and diarrhea.  She has a history of allergies and asthma, managed on Zyrtec, Allegra, Flonase, Arnuity, albuterol and allergy shots.  She did have a positive COVID test at home.  She has had her COVID-vaccine x3.    Past Medical History:  Diagnosis Date  . Abnormal uterine bleeding   . ADHD (attention deficit hyperactivity disorder)   . Anxiety   . Asthma   . Complication of anesthesia    30 years ago ; woke up in middle of wisdom tooth extraction   . Depression   . 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 GERD (gastroesophageal reflux disease)   . Hypertension    never has been treated with medications  . Ovarian cyst   . Polycystic disease, ovaries    POC   . Seasonal allergies   . Sleep apnea    CPAP   . Uterine fibroid     Current Outpatient Medications  Medication Sig Dispense Refill  .  acetaminophen (TYLENOL) 500 MG tablet Take 1,000 mg by mouth every 6 (six) hours as needed for mild pain.    Marland Kitchen albuterol (VENTOLIN HFA) 108 (90 Base) MCG/ACT inhaler Inhale 2 puffs into the lungs every 6 (six) hours as needed for wheezing or shortness of breath. (Patient not taking: Reported on 12/03/2020) 8 g 5  . buPROPion (WELLBUTRIN XL) 300 MG 24 hr tablet Take 1 tablet (300 mg total) by mouth daily. 90 tablet 1  . cetirizine (ZYRTEC) 10 MG tablet Take 10 mg by mouth at bedtime.    Marland Kitchen desvenlafaxine (PRISTIQ) 100 MG 24 hr tablet Take 1 tablet (100 mg total) by mouth every evening. 90 tablet 0  . diphenhydrAMINE (BENADRYL) 25 MG tablet Take 50 mg by mouth every 6 (six) hours as needed for itching or allergies.    Marland Kitchen doxycycline (VIBRAMYCIN) 100 MG capsule Take 1 capsule (100 mg total) by mouth 2 (two) times daily. 20 capsule 0  . EPINEPHrine 0.3 mg/0.3 mL IJ SOAJ injection Inject 0.3 mg into the muscle as needed for anaphylaxis. 1 each 2  . erythromycin ophthalmic ointment Place 1 application into both eyes 3 (three) times daily. 3.5 g 0  . fluticasone (FLONASE) 50 MCG/ACT nasal spray Place 2 sprays into both nostrils daily. (Patient taking differently: Place 2 sprays into both nostrils daily as needed for allergies or rhinitis.) 16 g 6  . Fluticasone Furoate (ARNUITY  ELLIPTA) 100 MCG/ACT AEPB Take 1 Inhaler by mouth daily.    Marland Kitchen gabapentin (NEURONTIN) 300 MG capsule Take 300-600 mg by mouth See admin instructions. Take 300mg  in the AM and 600mg  at bedtime. Take additional 300mg  at midday if needed for pain. (Patient not taking: Reported on 12/19/2020)    . ibuprofen (ADVIL) 800 MG tablet Take 800 mg by mouth every 8 (eight) hours as needed (pain).    Marland Kitchen lisdexamfetamine (VYVANSE) 20 MG capsule Take 1 capsule (20 mg total) by mouth 2 (two) times daily. 60 capsule 0  . metaxalone (SKELAXIN) 800 MG tablet Take 1 tablet (800 mg total) by mouth daily as needed for muscle spasms. 30 tablet 0  . norethindrone  (AYGESTIN) 5 MG tablet Take 1 tablet (5 mg total) by mouth daily. (Patient not taking: Reported on 12/19/2020) 30 tablet 2  . nystatin (MYCOSTATIN/NYSTOP) powder Apply 1 application topically 3 (three) times daily. 15 g 0  . olopatadine (PATANOL) 0.1 % ophthalmic solution Place 1 drop into both eyes 2 (two) times daily as needed for allergies. (Patient not taking: Reported on 12/03/2020)    . omeprazole (PRILOSEC OTC) 20 MG tablet Take 20 mg by mouth daily as needed (heartburn). (Patient not taking: Reported on 12/03/2020)    . oxyCODONE (OXY IR/ROXICODONE) 5 MG immediate release tablet Take 1 tablet (5 mg total) by mouth every 6 (six) hours as needed for severe pain. 5 tablet 0  . propranolol (INDERAL) 10 MG tablet TAKE 1 TABLET (10 MG TOTAL) BY MOUTH DAILY AS NEEDED (ANXIETY). ANXIETY 90 tablet 0  . XARELTO 20 MG TABS tablet Take 20 mg by mouth daily.     No current facility-administered medications for this visit.    Allergies  Allergen Reactions  . Morphine And Related Nausea And Vomiting       . Adhesive [Tape] Rash    blisters  . Nickel Rash    Family History  Problem Relation Age of Onset  . Arthritis Mother   . Hyperlipidemia Mother   . Heart disease Mother   . Hypertension Mother   . Rheum arthritis Maternal Aunt   . Hyperlipidemia Paternal Aunt   . Diabetes Paternal Aunt   . Rheum arthritis Maternal Grandmother   . Heart disease Maternal Grandmother   . Arthritis Maternal Grandfather   . Hyperlipidemia Maternal Grandfather   . Heart disease Maternal Grandfather   . Hypertension Maternal Grandfather   . Prostate cancer Paternal Uncle   . Colon cancer Neg Hx   . Breast cancer Neg Hx   . Pancreatic cancer Neg Hx   . Endometrial cancer Neg Hx   . Ovarian cancer Neg Hx     Social History   Socioeconomic History  . Marital status: Married    Spouse name: Not on file  . Number of children: Not on file  . Years of education: Not on file  . Highest education level: Not  on file  Occupational History  . Not on file  Tobacco Use  . Smoking status: Never Smoker  . Smokeless tobacco: Never Used  Vaping Use  . Vaping Use: Never used  Substance and Sexual Activity  . Alcohol use: No    Comment: seldom   . Drug use: No  . Sexual activity: Yes    Birth control/protection: None, Surgical    Comment: hUSBAND VASECTOMY  Other Topics Concern  . Not on file  Social History Narrative  . Not on file   Social Determinants of  Health   Financial Resource Strain: Not on file  Food Insecurity: Not on file  Transportation Needs: Not on file  Physical Activity: Not on file  Stress: Not on file  Social Connections: Not on file  Intimate Partner Violence: Not on file     Constitutional: Patient reports headache.  Denies fever, malaise, fatigue, or abrupt weight changes.  HEENT: Patient reports facial pain and pressure, nasal congestion, ear fullness.  Denies eye pain, eye redness, ringing in the ears, wax buildup, runny nose,  bloody nose, or sore throat. Respiratory: Patient reports cough and shortness of breath.  Denies difficulty breathing, cough or sputum production.   Cardiovascular: Denies chest pain, chest tightness, palpitations or swelling in the hands or feet.  Gastrointestinal: Denies abdominal pain, bloating, constipation, diarrhea or blood in the stool.  Neurological: Denies dizziness, difficulty with memory, difficulty with speech or problems with balance and coordination.    No other specific complaints in a complete review of systems (except as listed in HPI above).  Observations/Objective:   Wt Readings from Last 3 Encounters:  12/03/20 (!) 312 lb 3.2 oz (141.6 kg)  12/02/20 (!) 318 lb (144.2 kg)  11/26/20 (!) 318 lb 12.8 oz (144.6 kg)    General: In NAD. HEENT: Nose: congestion noted; Throat/Mouth: Hoarseness noted.  Pulmonary/Chest: Normal effort.  Dry cough noted. No respiratory distress.  Neurological: Alert and oriented.    BMET    Component Value Date/Time   NA 134 (L) 12/02/2020 1800   K 3.5 12/02/2020 1800   CL 101 12/02/2020 1800   CO2 22 12/02/2020 1800   GLUCOSE 93 12/02/2020 1800   BUN 9 12/02/2020 1800   CREATININE 1.14 (H) 12/02/2020 1800   CREATININE 1.01 06/14/2020 1637   CALCIUM 8.9 12/02/2020 1800   GFRNONAA >60 12/02/2020 1800   GFRAA >60 06/26/2020 1701    Lipid Panel     Component Value Date/Time   CHOL 224 (H) 02/15/2020 1514   TRIG 96.0 02/15/2020 1514   HDL 59.30 02/15/2020 1514   CHOLHDL 4 02/15/2020 1514   VLDL 19.2 02/15/2020 1514   LDLCALC 145 (H) 02/15/2020 1514    CBC    Component Value Date/Time   WBC 15.5 (H) 12/02/2020 1800   RBC 4.54 12/02/2020 1800   HGB 12.6 12/02/2020 1800   HCT 39.9 12/02/2020 1800   PLT 390 12/02/2020 1800   MCV 87.9 12/02/2020 1800   MCH 27.8 12/02/2020 1800   MCHC 31.6 12/02/2020 1800   RDW 13.7 12/02/2020 1800   LYMPHSABS 2.2 06/26/2020 1701   MONOABS 1.1 (H) 06/26/2020 1701   EOSABS 0.3 06/26/2020 1701   BASOSABS 0.1 06/26/2020 1701    Hgb A1C Lab Results  Component Value Date   HGBA1C 5.3 06/14/2020        Assessment and Plan:  COVID-19:  RX for Paxlovid x 5 days RX for Prednisone  Burst 20 mg x 5 days (pharmacy reports interaction with Paxlovid, she will hold off on taking this) Encourage rest and fluids Can take Tylenol and cough syrup OTC as needed for symptom management Encourage masking, social distancing, frequent handwashing and self quarantine x5 days  Return precautions discussed Follow Up Instructions:    I discussed the assessment and treatment plan with the patient. The patient was provided an opportunity to ask questions and all were answered. The patient agreed with the plan and demonstrated an understanding of the instructions.   The patient was advised to call back or seek an in-person evaluation  if the symptoms worsen or if the condition fails to improve as anticipated.    [name],  NP

## 2021-04-10 ENCOUNTER — Encounter: Payer: Self-pay | Admitting: Internal Medicine

## 2021-04-10 MED ORDER — LISDEXAMFETAMINE DIMESYLATE 20 MG PO CAPS: 20.0000 mg | ORAL_CAPSULE | Freq: Two times a day (BID) | ORAL | 0 refills | Status: DC

## 2021-04-10 NOTE — Patient Instructions (Signed)

## 2021-04-25 ENCOUNTER — Encounter: Payer: Self-pay | Admitting: Gynecologic Oncology

## 2021-04-25 ENCOUNTER — Encounter: Payer: Self-pay | Admitting: Internal Medicine

## 2021-04-25 ENCOUNTER — Telehealth: Payer: Self-pay | Admitting: *Deleted

## 2021-04-25 ENCOUNTER — Other Ambulatory Visit: Payer: Self-pay | Admitting: Gynecologic Oncology

## 2021-04-25 DIAGNOSIS — N83201 Unspecified ovarian cyst, right side: Secondary | ICD-10-CM

## 2021-04-25 NOTE — Telephone Encounter (Signed)
Scheduled the patient for an [name] scan and phone visit. Patient aware of appts

## 2021-04-25 NOTE — Progress Notes (Signed)
See mychart note. Plan for [name] to follow up on complex ovarian cyst.

## 2021-05-02 ENCOUNTER — Other Ambulatory Visit: Payer: Self-pay

## 2021-05-02 ENCOUNTER — Ambulatory Visit (HOSPITAL_COMMUNITY)
Admission: RE | Admit: 2021-05-02 | Discharge: 2021-05-02 | Disposition: A | Discharge status: Home / Self Care | Payer: BC Managed Care – PPO | Source: Ambulatory Visit | Attending: Gynecologic Oncology | Admitting: Gynecologic Oncology

## 2021-05-02 DIAGNOSIS — N83201 Unspecified ovarian cyst, right side: Secondary | ICD-10-CM | POA: Diagnosis present

## 2021-05-06 ENCOUNTER — Encounter: Payer: Self-pay | Admitting: Gynecologic Oncology

## 2021-05-06 ENCOUNTER — Other Ambulatory Visit: Payer: Self-pay | Admitting: Gynecologic Oncology

## 2021-05-06 ENCOUNTER — Inpatient Hospital Stay: Payer: BC Managed Care – PPO | Attending: Gynecologic Oncology | Admitting: Gynecologic Oncology

## 2021-05-06 DIAGNOSIS — N83201 Unspecified ovarian cyst, right side: Secondary | ICD-10-CM | POA: Diagnosis not present

## 2021-05-06 DIAGNOSIS — N83202 Unspecified ovarian cyst, left side: Secondary | ICD-10-CM | POA: Insufficient documentation

## 2021-05-06 DIAGNOSIS — D259 Leiomyoma of uterus, unspecified: Secondary | ICD-10-CM | POA: Insufficient documentation

## 2021-05-06 MED ORDER — OXYCODONE HCL 5 MG PO TABS: 5.0000 mg | ORAL_TABLET | ORAL | 0 refills | Status: DC | PRN

## 2021-05-06 MED ORDER — IBUPROFEN 800 MG PO TABS: 800.0000 mg | ORAL_TABLET | Freq: Three times a day (TID) | ORAL | 1 refills | Status: AC | PRN

## 2021-05-06 MED ORDER — SENNOSIDES-DOCUSATE SODIUM 8.6-50 MG PO TABS: 2.0000 | ORAL_TABLET | Freq: Every day | ORAL | 0 refills | Status: DC

## 2021-05-06 NOTE — Progress Notes (Signed)
Gynecologic Oncology Telehealth Consult Note: Gyn-Onc  I connected with Maria Bright on 05/06/21 at  4:00 PM EDT by telephone and verified that I am speaking with the correct person using two identifiers.  I discussed the limitations, risks, security and privacy concerns of performing an evaluation and management service by telemedicine and the availability of in-person appointments. I also discussed with the patient that there may be a patient responsible charge related to this service. The patient expressed understanding and agreed to proceed.  Other persons participating in the visit and their role in the encounter: None.  Patient's location: Work Provider's location: [employer]  Reason for Visit: Follow-up recent ultrasound  Interval History: The patient reports doing well since her last visit with me.  She underwent hysteroscopy, D&C, and endometrial ablation in the setting of abnormal uterine bleeding and submucosal fibroid in December.  Her postoperative course was complicated by urinary tract infection.  Her menses and pain/cramping related to menses has improved significantly since her surgery.  She has had some spotting intermittently since that time.  She continues to endorse chronic pain which she describes as cramping mostly on the right side.  Past Medical/Surgical History: Past Medical History:  Diagnosis Date  . Abnormal uterine bleeding   . ADHD (attention deficit hyperactivity disorder)   . Anxiety   . Asthma   . Complication of anesthesia    30 years ago ; woke up in middle of wisdom tooth extraction   . Depression   . 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 GERD (gastroesophageal reflux disease)   . Hypertension    never has been treated with medications  . Ovarian cyst   . Polycystic disease, ovaries    POC   . Seasonal allergies   . Sleep apnea    CPAP   . Uterine fibroid     Past Surgical History:  Procedure  Laterality Date  . ANTERIOR CERVICAL DECOMP/DISCECTOMY FUSION  06/24/2020   C5-6 ACDF (Dr. Ivan Croft)  . CHOLECYSTECTOMY N/A 06/30/2017   Procedure: LAPAROSCOPIC CHOLECYSTECTOMY;  Surgeon: Kinsinger, Arta Bruce, MD;  Location: [facility];  Service: General;  Laterality: N/A;  . DILITATION & CURRETTAGE/HYSTROSCOPY WITH HYDROTHERMAL ABLATION N/A 11/20/2020   Procedure: DILATATION & CURETTAGE/HYSTEROSCOPY WITH HYDROTHERMAL ABLATION AND RESECTION OF FIBROID WITH MYOSURE;  Surgeon: [name], MD;  Location: [facility];  Service: Gynecology;  Laterality: N/A;  . WISDOM TOOTH EXTRACTION      Family History  Problem Relation Age of Onset  . Arthritis Mother   . Hyperlipidemia Mother   . Heart disease Mother   . Hypertension Mother   . Rheum arthritis Maternal Aunt   . Hyperlipidemia Paternal Aunt   . Diabetes Paternal Aunt   . Rheum arthritis Maternal Grandmother   . Heart disease Maternal Grandmother   . Arthritis Maternal Grandfather   . Hyperlipidemia Maternal Grandfather   . Heart disease Maternal Grandfather   . Hypertension Maternal Grandfather   . Prostate cancer Paternal Uncle   . Colon cancer Neg Hx   . Breast cancer Neg Hx   . Pancreatic cancer Neg Hx   . Endometrial cancer Neg Hx   . Ovarian cancer Neg Hx     Social History   Socioeconomic History  . Marital status: Married    Spouse name: Not on file  . Number of children: Not on file  . Years of education: Not on file  . Highest education level: Not on file  Occupational  History  . Not on file  Tobacco Use  . Smoking status: Never Smoker  . Smokeless tobacco: Never Used  Vaping Use  . Vaping Use: Never used  Substance and Sexual Activity  . Alcohol use: No    Comment: seldom   . Drug use: No  . Sexual activity: Yes    Birth control/protection: None, Surgical    Comment: hUSBAND VASECTOMY  Other Topics Concern  . Not on file  Social History Narrative  . Not on file   Social Determinants of Health    Financial Resource Strain: Not on file  Food Insecurity: Not on file  Transportation Needs: Not on file  Physical Activity: Not on file  Stress: Not on file  Social Connections: Not on file    Current Medications:  Current Outpatient Medications:  .  acetaminophen (TYLENOL) 500 MG tablet, Take 1,000 mg by mouth every 6 (six) hours as needed for mild pain., Disp: , Rfl:  .  albuterol (VENTOLIN HFA) 108 (90 Base) MCG/ACT inhaler, Inhale 2 puffs into the lungs every 6 (six) hours as needed for wheezing or shortness of breath. (Patient not taking: Reported on 12/03/2020), Disp: 8 g, Rfl: 5 .  buPROPion (WELLBUTRIN XL) 300 MG 24 hr tablet, Take 1 tablet (300 mg total) by mouth daily., Disp: 90 tablet, Rfl: 1 .  cetirizine (ZYRTEC) 10 MG tablet, Take 10 mg by mouth at bedtime., Disp: , Rfl:  .  desvenlafaxine (PRISTIQ) 100 MG 24 hr tablet, TAKE 1 TABLET BY MOUTH EVERY DAY IN THE EVENING, Disp: 90 tablet, Rfl: 0 .  desvenlafaxine (PRISTIQ) 100 MG 24 hr tablet, Take 1 tablet (100 mg total) by mouth every evening., Disp: 90 tablet, Rfl: 0 .  diphenhydrAMINE (BENADRYL) 25 MG tablet, Take 50 mg by mouth every 6 (six) hours as needed for itching or allergies., Disp: , Rfl:  .  doxycycline (VIBRAMYCIN) 100 MG capsule, Take 1 capsule (100 mg total) by mouth 2 (two) times daily., Disp: 20 capsule, Rfl: 0 .  EPINEPHrine 0.3 mg/0.3 mL IJ SOAJ injection, Inject 0.3 mg into the muscle as needed for anaphylaxis., Disp: 1 each, Rfl: 2 .  erythromycin ophthalmic ointment, Place 1 application into both eyes 3 (three) times daily., Disp: 3.5 g, Rfl: 0 .  fluticasone (FLONASE) 50 MCG/ACT nasal spray, Place 2 sprays into both nostrils daily. (Patient taking differently: Place 2 sprays into both nostrils daily as needed for allergies or rhinitis.), Disp: 16 g, Rfl: 6 .  Fluticasone Furoate (ARNUITY ELLIPTA) 100 MCG/ACT AEPB, Take 1 Inhaler by mouth daily., Disp: , Rfl:  .  gabapentin (NEURONTIN) 300 MG capsule, Take  300-600 mg by mouth See admin instructions. Take 300mg  in the AM and 600mg  at bedtime. Take additional 300mg  at midday if needed for pain. (Patient not taking: Reported on 12/19/2020), Disp: , Rfl:  .  ibuprofen (ADVIL) 800 MG tablet, Take 1 tablet (800 mg total) by mouth every 8 (eight) hours as needed for moderate pain (pain). For AFTER surgery only, Disp: 30 tablet, Rfl: 1 .  lisdexamfetamine (VYVANSE) 20 MG capsule, Take 1 capsule (20 mg total) by mouth 2 (two) times daily., Disp: 60 capsule, Rfl: 0 .  metaxalone (SKELAXIN) 800 MG tablet, Take 1 tablet (800 mg total) by mouth daily as needed for muscle spasms., Disp: 30 tablet, Rfl: 0 .  nirmatrelvir/ritonavir EUA (PAXLOVID) TABS, GFR > 60 nirmatrelvir (150 mg) 2 tablet(s) PO BID and ritonavir (100 mg) one tablet PO BID for 5 days., Disp:  30 tablet, Rfl: 0 .  nystatin (MYCOSTATIN/NYSTOP) powder, Apply 1 application topically 3 (three) times daily., Disp: 15 g, Rfl: 0 .  olopatadine (PATANOL) 0.1 % ophthalmic solution, Place 1 drop into both eyes 2 (two) times daily as needed for allergies. (Patient not taking: Reported on 12/03/2020), Disp: , Rfl:  .  omeprazole (PRILOSEC OTC) 20 MG tablet, Take 20 mg by mouth daily as needed (heartburn). (Patient not taking: Reported on 12/03/2020), Disp: , Rfl:  .  oxyCODONE (OXY IR/ROXICODONE) 5 MG immediate release tablet, Take 1 tablet (5 mg total) by mouth every 4 (four) hours as needed for severe pain. For AFTER surgery only, do not take and drive, Disp: 15 tablet, Rfl: 0 .  predniSONE (DELTASONE) 20 MG tablet, Take 1 tablet (20 mg total) by mouth daily with breakfast., Disp: 5 tablet, Rfl: 0 .  propranolol (INDERAL) 10 MG tablet, TAKE 1 TABLET (10 MG TOTAL) BY MOUTH DAILY AS NEEDED (ANXIETY). ANXIETY, Disp: 90 tablet, Rfl: 0 .  senna-docusate (SENOKOT-S) 8.6-50 MG tablet, Take 2 tablets by mouth at bedtime. For AFTER surgery, do not take if having diarrhea, Disp: 30 tablet, Rfl: 0 .  XARELTO 20 MG TABS tablet,  Take 20 mg by mouth daily., Disp: , Rfl:   Review of Symptoms: Pertinent positives as per HPI.  Physical Exam: There were no vitals taken for this visit. Deferred given limitations of phone visit.  Laboratory & Radiologic Studies: Pelvic ultrasound on 6/3: IMPRESSION: 1. Complex right ovarian cyst with internal septations currently measuring 7.0 x 5.0 x 5.8 cm, similar or mildly larger than on prior exam, measuring 6.3 x 5.2 x 4.8 cm on prior ultrasound. No significant change in morphology. 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 2. Uterine fibroid, possibly smaller than on prior exams. 3. Small complex cyst in the left ovary may represent a collapsing corpus luteum.  Assessment & Plan: Maria Bright is a 45 y.o. woman with a complex adnexal mass.  From a symptom standpoint, the patient has had improvement since her surgery in December with regard to bleeding.  She and I discussed ultrasound findings from last week that show fairly stable to slightly increased size of her adnexal mass.  It continues to have some complexity, but overall is mostly cystic.  We discussed the difficulty with making a definitive diagnosis unless surgery is performed.  I also reviewed that given its size, she is at some risk of ovarian torsion.  After reviewing the risks and benefits of surgery versus continued imaging surveillance, the patient would like to move forward with definitive surgery.  I think now that she is off anticoagulation in the setting of her provoked blood clot after back surgery, this is reasonable.  We discussed still that she has some medical comorbidities that make her at higher risk for surgery.  In terms of surgery, we discussed robotic assistance would be used to perform unilateral oophorectomy, bilateral salpingectomy, and any other indicated procedures.  I would plan to put the adnexal mass in a bag for controlled and contained cyst drainage and then plan to send it for frozen  section.  If borderline tumor was identified, then I would recommend removal of the contralateral ovary as well as uterus and cervix.  If malignancy identified, then she understands that additional staging procedures may be indicated including lymph node sampling, peritoneal biopsies, and omentectomy.  If no malignancy or borderline tumor encountered, then plan would be to leave the patient's uterus as well as contralateral ovary to allow  her to go through natural menopause.  The surgery, possible risks and complications, and alternative treatment strategies were reviewed with her today.  The patient would like to move forward with getting surgery scheduled soon.  I will have [facility] call her to pick a surgery date and will recommend either an in person or by phone preoperative counseling appointment with Melissa.  All the patient's questions were answered today.  I discussed the assessment and treatment plan with the patient. The patient was provided with an opportunity to ask questions and all were answered. The patient agreed with the plan and demonstrated an understanding of the instructions.   The patient was advised to call back or see an in-person evaluation if the symptoms worsen or if the condition fails to improve as anticipated.   28 minutes of total time was spent for this patient encounter, including preparation, over-the-phone counseling with the patient and coordination of care, and documentation of the encounter.   Jeral Pinch, MD  Division of Gynecologic Oncology  Department of Obstetrics and Gynecology  [facility]

## 2021-05-06 NOTE — Progress Notes (Signed)
Post-op meds prescribed preop for surgery on June 16.

## 2021-05-09 ENCOUNTER — Encounter (HOSPITAL_COMMUNITY): Payer: Self-pay | Admitting: Gynecologic Oncology

## 2021-05-09 ENCOUNTER — Encounter: Payer: Self-pay | Admitting: Gynecologic Oncology

## 2021-05-09 ENCOUNTER — Telehealth: Payer: Self-pay | Admitting: Gynecologic Oncology

## 2021-05-09 ENCOUNTER — Other Ambulatory Visit: Payer: Self-pay

## 2021-05-09 NOTE — Progress Notes (Addendum)
COVID Vaccine Completed: Yes Date COVID Vaccine completed: x2 Has received booster: yes COVID vaccine manufacturer: [employer]     Date of COVID positive in last 90 days: 04/08/21  PCP - Jearld Fenton, NP Cardiologist - N/A Asthma-Dr. Jean Rosenthal  Chest x-ray - N/A EKG - 06/14/2020 in epic Stress Test - N/A ECHO - greater than 2 years Cardiac Cath - N/A Pacemaker/ICD device last checked: N/A  Sleep Study - Yes CPAP - Yes  Fasting Blood Sugar - N/A Checks Blood Sugar __N/A___ times a day  Blood Thinner Instructions: N/A Aspirin Instructions: N/A Last Dose:N/A  Activity level:  Unable to go up a flight of stairs without symptoms    Anesthesia review: OSA, HTN, DVT  Patient denies shortness of breath, fever, cough and chest pain at PAT appointment   Patient verbalized understanding of instructions that were given to them at the PAT appointment. Patient was also instructed that they will need to review over the PAT instructions again at home before surgery.

## 2021-05-09 NOTE — Telephone Encounter (Signed)
I called the patient and had a long conversation regarding her concerns about surgery coming up.  She had some questions about whether a hysterectomy could be done at the same time.  We discussed her risk for surgery, both in terms of her obesity but also her procedure last year that was complicated by a DVT in the postoperative setting.  Given the significant improvement in her symptoms after her D&C, partial myomectomy, and endometrial ablation, I do not see that there is a medical indication to proceed with hysterectomy at the time of her surgery.  If, though, her ovarian pathology is borderline or malignant, then this would be an indication to proceed with total hysterectomy.  We discussed that robotic surgery is done in Trendelenburg and that this can make it difficult to ventilate patients in the setting of obesity.  We also discussed the low but present risk of complications related to a larger surgery.  After our discussion, the patient was very understanding of my recommendation not to proceed with concurrent total hysterectomy.  She is amenable to proceeding with evaluation of the ovary only and other procedures as indicated by frozen section.  Jeral Pinch MD Gynecologic Oncology

## 2021-05-14 ENCOUNTER — Telehealth: Payer: Self-pay

## 2021-05-14 NOTE — Telephone Encounter (Signed)
Telephone call to check on pre-operative status.  Patient compliant with pre-operative instructions.  Reinforced NPO after midnight.  No questions or concerns voiced.  Instructed to call for any needs.   

## 2021-05-15 ENCOUNTER — Ambulatory Visit (HOSPITAL_COMMUNITY)
Admission: RE | Admit: 2021-05-15 | Discharge: 2021-05-15 | Disposition: A | Discharge status: Home / Self Care | Payer: BC Managed Care – PPO | Attending: Gynecologic Oncology | Admitting: Gynecologic Oncology

## 2021-05-15 ENCOUNTER — Ambulatory Visit (HOSPITAL_COMMUNITY): Payer: BC Managed Care – PPO | Admitting: Physician Assistant

## 2021-05-15 ENCOUNTER — Encounter (HOSPITAL_COMMUNITY)
Admission: RE | Disposition: A | Discharge status: Home / Self Care | Payer: Self-pay | Source: Home / Self Care | Attending: Gynecologic Oncology

## 2021-05-15 ENCOUNTER — Encounter (HOSPITAL_COMMUNITY): Payer: Self-pay | Admitting: Gynecologic Oncology

## 2021-05-15 DIAGNOSIS — Z885 Allergy status to narcotic agent status: Secondary | ICD-10-CM | POA: Insufficient documentation

## 2021-05-15 DIAGNOSIS — N83201 Unspecified ovarian cyst, right side: Secondary | ICD-10-CM

## 2021-05-15 DIAGNOSIS — D271 Benign neoplasm of left ovary: Secondary | ICD-10-CM | POA: Diagnosis not present

## 2021-05-15 DIAGNOSIS — G8929 Other chronic pain: Secondary | ICD-10-CM | POA: Insufficient documentation

## 2021-05-15 DIAGNOSIS — D270 Benign neoplasm of right ovary: Secondary | ICD-10-CM | POA: Diagnosis present

## 2021-05-15 DIAGNOSIS — Z888 Allergy status to other drugs, medicaments and biological substances status: Secondary | ICD-10-CM | POA: Insufficient documentation

## 2021-05-15 HISTORY — DX: Migraine, unspecified, not intractable, without status migrainosus: G43.909

## 2021-05-15 HISTORY — DX: Fibromyalgia: M79.7

## 2021-05-15 HISTORY — DX: Other cervical disc degeneration, unspecified cervical region: M50.30

## 2021-05-15 HISTORY — PX: XI ROBOTIC ASSISTED SALPINGECTOMY: SHX6824

## 2021-05-15 HISTORY — DX: Personal history of other diseases of the nervous system and sense organs: Z86.69

## 2021-05-15 HISTORY — DX: Palpitations: R00.2

## 2021-05-15 HISTORY — DX: Enthesopathy, unspecified: M77.9

## 2021-05-15 HISTORY — DX: Dyspnea, unspecified: R06.00

## 2021-05-15 HISTORY — PX: XI ROBOTIC ASSISTED OOPHORECTOMY: SHX6823

## 2021-05-15 LAB — CBC
HCT: 44.4 % (ref 36.0–46.0)
Hemoglobin: 14.6 g/dL (ref 12.0–15.0)
MCH: 28.2 pg (ref 26.0–34.0)
MCHC: 32.9 g/dL (ref 30.0–36.0)
MCV: 85.7 fL (ref 80.0–100.0)
Platelets: 263 10*3/uL (ref 150–400)
RBC: 5.18 MIL/uL — ABNORMAL HIGH (ref 3.87–5.11)
RDW: 14.6 % (ref 11.5–15.5)
WBC: 5.7 10*3/uL (ref 4.0–10.5)
nRBC: 0 % (ref 0.0–0.2)

## 2021-05-15 LAB — TYPE AND SCREEN
ABO/RH(D): A NEG
Antibody Screen: NEGATIVE

## 2021-05-15 LAB — BASIC METABOLIC PANEL
Anion gap: 8 (ref 5–15)
BUN: 15 mg/dL (ref 6–20)
CO2: 25 mmol/L (ref 22–32)
Calcium: 9 mg/dL (ref 8.9–10.3)
Chloride: 103 mmol/L (ref 98–111)
Creatinine, Ser: 0.96 mg/dL (ref 0.44–1.00)
GFR, Estimated: >60 mL/min (ref >60)
Glucose, Bld: 105 mg/dL — ABNORMAL HIGH (ref 70–99)
Potassium: 3.7 mmol/L (ref 3.5–5.1)
Sodium: 136 mmol/L (ref 135–145)

## 2021-05-15 LAB — ABO/RH: ABO/RH(D): A NEG

## 2021-05-15 LAB — PREGNANCY, URINE: Preg Test, Ur: NEGATIVE

## 2021-05-15 SURGERY — OOPHORECTOMY, ROBOT-ASSISTED
Anesthesia: General | Laterality: Right

## 2021-05-15 MED ORDER — KETAMINE HCL 10 MG/ML IJ SOLN
INTRAMUSCULAR | Status: DC | PRN
  Administered 2021-05-15: 60 mg via INTRAVENOUS

## 2021-05-15 MED ORDER — CHLORHEXIDINE GLUCONATE 0.12 % MT SOLN
15.0000 mL | Freq: Once | OROMUCOSAL | Status: AC
  Administered 2021-05-15: 15 mL via OROMUCOSAL

## 2021-05-15 MED ORDER — GABAPENTIN 300 MG PO CAPS
300.0000 mg | ORAL_CAPSULE | ORAL | Status: AC
  Administered 2021-05-15: 300 mg via ORAL
  Filled 2021-05-15: qty 1

## 2021-05-15 MED ORDER — SUCCINYLCHOLINE CHLORIDE 200 MG/10ML IV SOSY
PREFILLED_SYRINGE | INTRAVENOUS | Status: DC | PRN
  Administered 2021-05-15: 140 mg via INTRAVENOUS

## 2021-05-15 MED ORDER — PHENYLEPHRINE HCL-NACL 20-0.9 MG/250ML-% IV SOLN
INTRAVENOUS | Status: DC | PRN
  Administered 2021-05-15: 25 ug/min via INTRAVENOUS

## 2021-05-15 MED ORDER — HEPARIN SODIUM (PORCINE) 5000 UNIT/ML IJ SOLN
5000.0000 [IU] | INTRAMUSCULAR | Status: AC
  Administered 2021-05-15: 5000 [IU] via SUBCUTANEOUS
  Filled 2021-05-15: qty 1

## 2021-05-15 MED ORDER — MIDAZOLAM HCL 5 MG/5ML IJ SOLN
INTRAMUSCULAR | Status: DC | PRN
  Administered 2021-05-15: 2 mg via INTRAVENOUS

## 2021-05-15 MED ORDER — LACTATED RINGERS IV SOLN: INTRAVENOUS | Status: DC

## 2021-05-15 MED ORDER — ACETAMINOPHEN 500 MG PO TABS
1000.0000 mg | ORAL_TABLET | ORAL | Status: AC
  Administered 2021-05-15: 1000 mg via ORAL
  Filled 2021-05-15: qty 2

## 2021-05-15 MED ORDER — ONDANSETRON HCL 4 MG/2ML IJ SOLN
INTRAMUSCULAR | Status: DC | PRN
  Administered 2021-05-15: 4 mg via INTRAVENOUS

## 2021-05-15 MED ORDER — DEXAMETHASONE SODIUM PHOSPHATE 10 MG/ML IJ SOLN
INTRAMUSCULAR | Status: DC | PRN
  Administered 2021-05-15: 10 mg via INTRAVENOUS

## 2021-05-15 MED ORDER — PHENYLEPHRINE 40 MCG/ML (10ML) SYRINGE FOR IV PUSH (FOR BLOOD PRESSURE SUPPORT)
PREFILLED_SYRINGE | INTRAVENOUS | Status: DC | PRN
  Administered 2021-05-15 (×2): 80 ug via INTRAVENOUS

## 2021-05-15 MED ORDER — LACTATED RINGERS IR SOLN
Status: DC | PRN
  Administered 2021-05-15: 1000 mL

## 2021-05-15 MED ORDER — TRAMADOL HCL 50 MG PO TABS
ORAL_TABLET | ORAL | Status: AC
  Filled 2021-05-15: qty 1

## 2021-05-15 MED ORDER — LIDOCAINE HCL 2 % IJ SOLN
INTRAMUSCULAR | Status: AC
  Filled 2021-05-15: qty 20

## 2021-05-15 MED ORDER — ROCURONIUM BROMIDE 10 MG/ML (PF) SYRINGE
PREFILLED_SYRINGE | INTRAVENOUS | Status: DC | PRN
  Administered 2021-05-15: 70 mg via INTRAVENOUS

## 2021-05-15 MED ORDER — BUPIVACAINE HCL 0.25 % IJ SOLN
INTRAMUSCULAR | Status: DC | PRN
  Administered 2021-05-15: 20 mL

## 2021-05-15 MED ORDER — FENTANYL CITRATE (PF) 250 MCG/5ML IJ SOLN
INTRAMUSCULAR | Status: AC
  Filled 2021-05-15: qty 5

## 2021-05-15 MED ORDER — LIDOCAINE 20MG/ML (2%) 15 ML SYRINGE OPTIME
INTRAMUSCULAR | Status: DC | PRN
  Administered 2021-05-15: 1.5 mg/kg/h via INTRAVENOUS

## 2021-05-15 MED ORDER — SODIUM CHLORIDE 0.9% FLUSH: 3.0000 mL | Freq: Two times a day (BID) | INTRAVENOUS | Status: DC

## 2021-05-15 MED ORDER — TRAMADOL HCL 50 MG PO TABS
50.0000 mg | ORAL_TABLET | Freq: Four times a day (QID) | ORAL | Status: DC | PRN
  Administered 2021-05-15: 50 mg via ORAL

## 2021-05-15 MED ORDER — DEXAMETHASONE SODIUM PHOSPHATE 4 MG/ML IJ SOLN
4.0000 mg | INTRAMUSCULAR | Status: DC
Start: 2021-05-15 — End: 2021-05-15

## 2021-05-15 MED ORDER — CEFAZOLIN IN SODIUM CHLORIDE 3-0.9 GM/100ML-% IV SOLN
INTRAVENOUS | Status: AC
  Filled 2021-05-15: qty 100

## 2021-05-15 MED ORDER — SUGAMMADEX SODIUM 500 MG/5ML IV SOLN
INTRAVENOUS | Status: DC | PRN
  Administered 2021-05-15: 300 mg via INTRAVENOUS

## 2021-05-15 MED ORDER — ORAL CARE MOUTH RINSE: 15.0000 mL | Freq: Once | OROMUCOSAL | Status: AC

## 2021-05-15 MED ORDER — PROPOFOL 10 MG/ML IV BOLUS
INTRAVENOUS | Status: DC | PRN
  Administered 2021-05-15: 200 mg via INTRAVENOUS

## 2021-05-15 MED ORDER — MIDAZOLAM HCL 2 MG/2ML IJ SOLN
INTRAMUSCULAR | Status: AC
  Filled 2021-05-15: qty 2

## 2021-05-15 MED ORDER — LIDOCAINE 2% (20 MG/ML) 5 ML SYRINGE
INTRAMUSCULAR | Status: DC | PRN
  Administered 2021-05-15: 60 mg via INTRAVENOUS

## 2021-05-15 MED ORDER — GLYCOPYRROLATE PF 0.2 MG/ML IJ SOSY
PREFILLED_SYRINGE | INTRAMUSCULAR | Status: AC
  Filled 2021-05-15: qty 2

## 2021-05-15 MED ORDER — SCOPOLAMINE 1 MG/3DAYS TD PT72
1.0000 | MEDICATED_PATCH | TRANSDERMAL | Status: DC
Start: 2021-05-15 — End: 2021-05-15
  Administered 2021-05-15: 1.5 mg via TRANSDERMAL
  Filled 2021-05-15: qty 1

## 2021-05-15 MED ORDER — FENTANYL CITRATE (PF) 250 MCG/5ML IJ SOLN
INTRAMUSCULAR | Status: DC | PRN
  Administered 2021-05-15 (×2): 50 ug via INTRAVENOUS

## 2021-05-15 MED ORDER — KETAMINE HCL 10 MG/ML IJ SOLN
INTRAMUSCULAR | Status: AC
  Filled 2021-05-15: qty 1

## 2021-05-15 MED ORDER — BUPIVACAINE HCL 0.25 % IJ SOLN
INTRAMUSCULAR | Status: AC
  Filled 2021-05-15: qty 1

## 2021-05-15 MED ORDER — HYDROMORPHONE HCL 1 MG/ML IJ SOLN: 0.2500 mg | INTRAMUSCULAR | Status: DC | PRN

## 2021-05-15 MED ORDER — GLYCOPYRROLATE PF 0.2 MG/ML IJ SOSY
PREFILLED_SYRINGE | INTRAMUSCULAR | Status: DC | PRN
  Administered 2021-05-15: .2 mg via INTRAVENOUS

## 2021-05-15 SURGICAL SUPPLY — 66 items
BACTOSHIELD CHG 4% 4OZ (MISCELLANEOUS) ×1
BAG LAPAROSCOPIC 12 15 PORT 16 (BASKET) ×3 IMPLANT
BAG RETRIEVAL 12/15 (BASKET) ×4
BLADE EXTENDED COATED 6.5IN (ELECTRODE) ×4 IMPLANT
CHLORAPREP W/TINT 26 (MISCELLANEOUS) ×4 IMPLANT
CLIP VESOCCLUDE LG 6/CT (CLIP) ×4 IMPLANT
CLIP VESOCCLUDE MED 6/CT (CLIP) ×4 IMPLANT
CLIP VESOCCLUDE MED LG 6/CT (CLIP) ×4 IMPLANT
COVER BACK TABLE 60X90IN (DRAPES) ×4 IMPLANT
COVER TIP SHEARS 8 DVNC (MISCELLANEOUS) ×3 IMPLANT
COVER TIP SHEARS 8MM DA VINCI (MISCELLANEOUS) ×1
DERMABOND ADVANCED (GAUZE/BANDAGES/DRESSINGS) ×1
DERMABOND ADVANCED .7 DNX12 (GAUZE/BANDAGES/DRESSINGS) ×3 IMPLANT
DRAPE ARM DVNC X/XI (DISPOSABLE) ×12 IMPLANT
DRAPE COLUMN DVNC XI (DISPOSABLE) ×3 IMPLANT
DRAPE DA VINCI XI ARM (DISPOSABLE) ×4
DRAPE DA VINCI XI COLUMN (DISPOSABLE) ×1
DRAPE SHEET LG 3/4 BI-LAMINATE (DRAPES) ×4 IMPLANT
DRAPE SURG IRRIG POUCH 19X23 (DRAPES) ×4 IMPLANT
DRAPE WARM FLUID 44X44 (DRAPES) ×4 IMPLANT
ELECT REM PT RETURN 15FT ADLT (MISCELLANEOUS) ×4 IMPLANT
GLOVE SURG ENC MOIS LTX SZ6 (GLOVE) ×16 IMPLANT
GLOVE SURG ENC MOIS LTX SZ6.5 (GLOVE) ×8 IMPLANT
GOWN STRL REUS W/ TWL LRG LVL3 (GOWN DISPOSABLE) ×12 IMPLANT
GOWN STRL REUS W/TWL LRG LVL3 (GOWN DISPOSABLE) ×4
HOLDER FOLEY CATH W/STRAP (MISCELLANEOUS) ×4 IMPLANT
IRRIG SUCT STRYKERFLOW 2 WTIP (MISCELLANEOUS) ×4
IRRIGATION SUCT STRKRFLW 2 WTP (MISCELLANEOUS) ×3 IMPLANT
KIT BASIN OR (CUSTOM PROCEDURE TRAY) ×4 IMPLANT
KIT TURNOVER KIT A (KITS) ×4 IMPLANT
MANIPULATOR UTERINE 4.5 ZUMI (MISCELLANEOUS) ×4 IMPLANT
NEEDLE HYPO 21X1.5 SAFETY (NEEDLE) ×4 IMPLANT
NEEDLE HYPO 22GX1.5 SAFETY (NEEDLE) ×8 IMPLANT
NS IRRIG 1000ML POUR BTL (IV SOLUTION) ×8 IMPLANT
OBTURATOR OPTICAL STANDARD 8MM (TROCAR) ×1
OBTURATOR OPTICAL STND 8 DVNC (TROCAR) ×3
OBTURATOR OPTICALSTD 8 DVNC (TROCAR) ×3 IMPLANT
PACK GENERAL/GYN (CUSTOM PROCEDURE TRAY) ×4 IMPLANT
PACK ROBOT GYN CUSTOM WL (TRAY / TRAY PROCEDURE) ×4 IMPLANT
PAD POSITIONING PINK XL (MISCELLANEOUS) ×4 IMPLANT
PORT ACCESS TROCAR AIRSEAL 12 (TROCAR) ×3 IMPLANT
PORT ACCESS TROCAR AIRSEAL 5M (TROCAR) ×1
SCRUB CHG 4% DYNA-HEX 4OZ (MISCELLANEOUS) ×3 IMPLANT
SEAL CANN UNIV 5-8 DVNC XI (MISCELLANEOUS) ×12 IMPLANT
SEAL XI 5MM-8MM UNIVERSAL (MISCELLANEOUS) ×4
SET TRI-LUMEN FLTR TB AIRSEAL (TUBING) ×4 IMPLANT
SHEET LAVH (DRAPES) ×4 IMPLANT
SLEEVE SUCTION CATH 165 (SLEEVE) ×4 IMPLANT
SUT MNCRL AB 4-0 PS2 18 (SUTURE) ×8 IMPLANT
SUT PDS AB 1 TP1 96 (SUTURE) ×8 IMPLANT
SUT VIC AB 0 CT1 36 (SUTURE) ×16 IMPLANT
SUT VIC AB 2-0 CT1 27 (SUTURE) ×2
SUT VIC AB 2-0 CT1 36 (SUTURE) ×8 IMPLANT
SUT VIC AB 2-0 CT1 TAPERPNT 27 (SUTURE) ×6 IMPLANT
SUT VIC AB 2-0 CT2 27 (SUTURE) ×24 IMPLANT
SUT VIC AB 2-0 SH 27 (SUTURE)
SUT VIC AB 2-0 SH 27X BRD (SUTURE) IMPLANT
SUT VIC AB 3-0 SH 27 (SUTURE) ×1
SUT VIC AB 3-0 SH 27X BRD (SUTURE) ×3 IMPLANT
SUT VIC AB 4-0 PS2 18 (SUTURE) ×8 IMPLANT
SYR 30ML LL (SYRINGE) ×8 IMPLANT
TOWEL OR 17X26 10 PK STRL BLUE (TOWEL DISPOSABLE) ×4 IMPLANT
TOWEL OR NON WOVEN STRL DISP B (DISPOSABLE) ×4 IMPLANT
TRAY FOLEY MTR SLVR 16FR STAT (SET/KITS/TRAYS/PACK) ×4 IMPLANT
UNDERPAD 30X36 HEAVY ABSORB (UNDERPADS AND DIAPERS) ×4 IMPLANT
WATER STERILE IRR 1000ML POUR (IV SOLUTION) ×4 IMPLANT

## 2021-05-15 NOTE — Anesthesia Postprocedure Evaluation (Signed)
Anesthesia Post Note  Patient: Maria Bright  Procedure(s) Performed: XI ROBOTIC ASSISTED RIGHT SALPINGO-OOPHORECTOMY (Right) XI ROBOTIC ASSISTED LEFT SALPINGECTOMY (Bilateral)     Patient location during evaluation: PACU Anesthesia Type: General Level of consciousness: awake and alert Pain management: pain level controlled Vital Signs Assessment: post-procedure vital signs reviewed and stable Respiratory status: spontaneous breathing, nonlabored ventilation and respiratory function stable Cardiovascular status: blood pressure returned to baseline and stable Postop Assessment: no apparent nausea or vomiting Anesthetic complications: no   No notable events documented.  Last Vitals:  Vitals:   05/15/21 1245 05/15/21 1300  BP: 127/87 114/81  Pulse: 80 82  Resp: 16 20  Temp:    SpO2: 100% 100%    Last Pain:  Vitals:   05/15/21 1245  TempSrc:   PainSc: 3                  Cyrah Mclamb,W. EDMOND

## 2021-05-15 NOTE — Interval H&P Note (Signed)
History and Physical Interval Note:  05/15/2021 9:35 AM  Maria Bright  has presented today for surgery, with the diagnosis of ADNEXAL MASS.  The various methods of treatment have been discussed with the patient and family. After consideration of risks, benefits and other options for treatment, the patient has consented to  Procedure(s): XI ROBOTIC ASSISTED OOPHORECTOMY (N/A) XI ROBOTIC ASSISTED SALPINGECTOMY (Bilateral) POSSIBLE LAPAROTOMY WITH STAGING AND POSSIBLE HYSTERECTOMY (N/A) as a surgical intervention.  The patient's history has been reviewed, patient examined, no change in status, stable for surgery.  I have reviewed the patient's chart and labs.  Questions were answered to the patient's satisfaction.     Lafonda Mosses

## 2021-05-15 NOTE — Transfer of Care (Signed)
Immediate Anesthesia Transfer of Care Note  Patient: Maria Bright  Procedure(s) Performed: XI ROBOTIC ASSISTED RIGHT SALPINGO-OOPHORECTOMY (Right) XI ROBOTIC ASSISTED LEFT SALPINGECTOMY (Bilateral)  Patient Location: PACU  Anesthesia Type:General  Level of Consciousness: awake, sedated and patient cooperative  Airway & Oxygen Therapy: Patient connected to face mask oxygen  Post-op Assessment: Report given to RN and Post -op Vital signs reviewed and stable  Post vital signs: stable  Last Vitals:  Vitals Value Taken Time  BP 142/85 05/15/21 1215  Temp    Pulse 85 05/15/21 1216  Resp 17 05/15/21 1216  SpO2 100 % 05/15/21 1216  Vitals shown include unvalidated device data.  Last Pain:  Vitals:   05/15/21 1001  TempSrc: Oral  PainSc: 3       Patients Stated Pain Goal: 3 (78/67/67 2094)  Complications: No notable events documented.

## 2021-05-15 NOTE — H&P (Signed)
GYNECOLOGIC ONCOLOGY HISTORY AND PHYSICAL  Date of Service: 05/15/21  Assessment/Plan:  Maria Bright is a 45 y.o. woman with a complex adnexal mass.   Plan for robotic [name], BS and possible staging as previously discussed. Surgery reviewed again with patient including indication for hysterectomy if borderline tumor or malignancy encountered.   Jeral Pinch, MD  Gynecologic Oncology  ___________________________________________   History of Present Illness:  Maria Bright is a 45 y.o. y.o. female presenting for robotic UO, BS, and possible staging.   The patient reports doing well since her last visit with me.  She underwent hysteroscopy, D&C, and endometrial ablation in the setting of abnormal uterine bleeding and submucosal fibroid in December.  Her postoperative course was complicated by urinary tract infection.  Her menses and pain/cramping related to menses has improved significantly since her surgery.  She has had some spotting intermittently since that time.  She continues to endorse chronic pain which she describes as cramping mostly on the right side.   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   acute deep vein thrombosis involving the right posterior tibial veins, and right peroneal veins   Dyspnea    due to allergies, asthma   Fibromyalgia    GERD (gastroesophageal reflux disease)    Heart palpitations    has not been seen for it   History of peripheral neuropathy    Hypertension    never has been treated with medications   Migraine    Ovarian cyst    Polycystic disease, ovaries    POC    Seasonal allergies    Sleep  apnea    CPAP    Uterine fibroid      PAST SURGICAL HISTORY:  Past Surgical History:  Procedure Laterality Date   ANTERIOR CERVICAL DECOMP/DISCECTOMY FUSION  06/24/2020   C5-6 ACDF (Dr. Ivan Croft)   CHOLECYSTECTOMY N/A 06/30/2017   Procedure: LAPAROSCOPIC CHOLECYSTECTOMY;  Surgeon: Mickeal Skinner, MD;  Location: [facility];  Service: General;  Laterality: N/A;   DILITATION & CURRETTAGE/HYSTROSCOPY WITH HYDROTHERMAL ABLATION N/A 11/20/2020   Procedure: DILATATION & CURETTAGE/HYSTEROSCOPY WITH HYDROTHERMAL ABLATION AND RESECTION OF FIBROID WITH MYOSURE;  Surgeon: [name], MD;  Location: [facility];  Service: Gynecology;  Laterality: N/A;   WISDOM TOOTH EXTRACTION      OB/GYN HISTORY:  OB History  Gravida Para Term Preterm AB Living  6 2     2 2   SAB IAB Ectopic Multiple Live Births  2       2    # Outcome Date GA Lbr Len/2nd Weight Sex Delivery Anes PTL Lv  6 Para           5 Para           4 Gravida      Vag-Spont     3 Gravida      Vag-Spont     2 SAB           1 SAB             No LMP recorded. Patient has had an  ablation.  MEDICATIONS: @ENCMED @  ALLERGIES:  Allergies  Allergen Reactions   Lactose Intolerance (Gi)    Latex Hives   Morphine And Related Nausea And Vomiting        Other     Tomatoes, pineapple, oranges, grapefruit, lemons, limes, citrus fruits, and all nightshade vegetables    Adhesive [Tape] Rash    blisters   Nickel Rash     FAMILY HISTORY:  Family History  Problem Relation Age of Onset   Arthritis Mother    Hyperlipidemia Mother    Heart disease Mother    Hypertension Mother    Rheum arthritis Maternal Aunt    Hyperlipidemia Paternal Aunt    Diabetes Paternal Aunt    Rheum arthritis Maternal Grandmother    Heart disease Maternal Grandmother    Arthritis Maternal Grandfather    Hyperlipidemia Maternal Grandfather    Heart disease Maternal Grandfather    Hypertension Maternal Grandfather    Prostate cancer Paternal Uncle     Colon cancer Neg Hx    Breast cancer Neg Hx    Pancreatic cancer Neg Hx    Endometrial cancer Neg Hx    Ovarian cancer Neg Hx      SOCIAL HISTORY:  Social Connections: Not on file    REVIEW OF SYSTEMS:  Denies appetite changes, fevers, chills, fatigue, unexplained weight changes. Denies hearing loss, neck lumps or masses, mouth sores, ringing in ears or voice changes. Denies cough or wheezing.  Denies shortness of breath. Denies chest pain or palpitations. Denies leg swelling. Denies abdominal distention, pain, blood in stools, constipation, diarrhea, nausea, vomiting, or early satiety. Denies pain with intercourse, dysuria, frequency, hematuria or incontinence. Denies hot flashes, pelvic pain, vaginal bleeding or vaginal discharge.   Denies joint pain, back pain or muscle pain/cramps. Denies itching, rash, or wounds. Denies dizziness, headaches, numbness or seizures. Denies swollen lymph nodes or glands, denies easy bruising or bleeding. Denies anxiety, depression, confusion, or decreased concentration.  Physical Exam:  Vital Signs for this encounter:  Height 5\' 6"  (1.676 m), weight (!) 315 lb (142.9 kg). Body mass index is 50.84 kg/m. General: Alert, oriented, no acute distress.  HEENT: Normocephalic, atraumatic. Sclera anicteric.  Chest: Clear to auscultation bilaterally. No wheezes, rhonchi, or rales. Cardiovascular: Regular rate and rhythm, no murmurs, rubs, or gallops.  Extremities: Grossly normal range of motion. Warm, well perfused. No edema bilaterally.  Skin: No rashes or lesions.   LABORATORY AND RADIOLOGIC DATA:  Outside medical records were reviewed to synthesize the above history, along with the history and physical obtained during the visit.   Lab Results  Component Value Date   WBC 15.5 (H) 12/02/2020   HGB 12.6 12/02/2020   HCT 39.9 12/02/2020   PLT 390 12/02/2020   GLUCOSE 93 12/02/2020   CHOL 224 (H) 02/15/2020   TRIG 96.0 02/15/2020   HDL 59.30  02/15/2020   LDLCALC 145 (H) 02/15/2020   ALT 41 12/02/2020   AST 28 12/02/2020   NA 134 (L) 12/02/2020   K 3.5 12/02/2020   CL 101 12/02/2020   CREATININE 1.14 (H) 12/02/2020   BUN 9 12/02/2020   CO2 22 12/02/2020   TSH 1.86 02/15/2020   INR 0.9 06/14/2020   HGBA1C 5.3 06/14/2020   Pelvic ultrasound on 6/3: IMPRESSION: 1. Complex right ovarian cyst with internal septations currently measuring 7.0 x 5.0 x 5.8 cm, similar or mildly larger than on prior exam, measuring 6.3 x 5.2 x 4.8 cm on prior ultrasound. No significant change  in morphology. 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 2. Uterine fibroid, possibly smaller than on prior exams. 3. Small complex cyst in the left ovary may represent a collapsing corpus luteum.

## 2021-05-15 NOTE — Anesthesia Procedure Notes (Signed)
Date/Time: 05/15/2021 11:50 AM Performed by: Pilar Grammes, CRNA

## 2021-05-15 NOTE — Anesthesia Preprocedure Evaluation (Addendum)
Anesthesia Evaluation  Patient identified by MRN, date of birth, ID band Patient awake    Reviewed: Allergy & Precautions, H&P , NPO status , Patient's Chart, lab work & pertinent test results, reviewed documented beta blocker date and time   Airway Mallampati: II  TM Distance: >3 FB Neck ROM: Full    Dental no notable dental hx. (+) Teeth Intact, Dental Advisory Given   Pulmonary asthma , sleep apnea and Continuous Positive Airway Pressure Ventilation ,    Pulmonary exam normal breath sounds clear to auscultation       Cardiovascular hypertension, Pt. on medications and Pt. on home beta blockers  Rhythm:Regular Rate:Normal     Neuro/Psych  Headaches, Anxiety Depression    GI/Hepatic Neg liver ROS, GERD  Medicated,  Endo/Other  Morbid obesity  Renal/GU negative Renal ROS  negative genitourinary   Musculoskeletal  (+) Arthritis , Osteoarthritis,  Fibromyalgia -  Abdominal   Peds  Hematology negative hematology ROS (+)   Anesthesia Other Findings   Reproductive/Obstetrics negative OB ROS                           Anesthesia Physical Anesthesia Plan  ASA: 3  Anesthesia Plan: General   Post-op Pain Management:    Induction: Intravenous  PONV Risk Score and Plan: 4 or greater and Ondansetron, Dexamethasone, Midazolam and Scopolamine patch - Pre-op  Airway Management Planned: Oral ETT  Additional Equipment:   Intra-op Plan:   Post-operative Plan: Extubation in OR  Informed Consent: I have reviewed the patients History and Physical, chart, labs and discussed the procedure including the risks, benefits and alternatives for the proposed anesthesia with the patient or authorized representative who has indicated his/her understanding and acceptance.     Dental advisory given  Plan Discussed with: CRNA  Anesthesia Plan Comments:        Anesthesia Quick Evaluation

## 2021-05-15 NOTE — Op Note (Signed)
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   Anesthesia: GET  Urine Output: 100cc  Operative Findings: On EUA, moderately mobile 8cm uterus. On intra-abdominal entry, normal upper abdominal survey. Normal omentum and small and large bowel. NO ascites. Some adhesions of bilateral ovaries to the posterior cul de sac, uterus and bilateral side walls c/w possible prior history of PID. Very benign appearing, smooth 6cm cyst of the right ovary noted. Normal appearing left ovary and bilateral fallopian tubes. Uterus 8-10cm, normal appearing. No intra-abdominal or pelvic evidence of disease. Pathology review noted benign right ovarian cyst, nothing to freeze.  Estimated Blood Loss:  less than 50 mL      Total IV Fluids: see I&O flowsheet         Specimens: bilateral tubes, right ovary, pelvic washings         Complications:  None apparent; patient tolerated the procedure well.         Disposition: PACU - hemodynamically stable.  Procedure Details  The patient was seen in the Holding Room. The risks, benefits, complications, treatment options, and expected outcomes were discussed with the patient.  The patient concurred with the proposed plan, giving informed consent.  The site of surgery properly noted/marked. The patient was identified as Maria Bright and the procedure verified as a Robotic-assisted unilateral oophorectomy, bilateral salpingectomy, possible staging.   After induction of anesthesia, the patient was draped and prepped in the usual  sterile manner. Patient was placed in supine position after anesthesia and draped and prepped in the usual sterile manner as follows: Her arms were tucked to her side with all appropriate precautions.  The shoulders were stabilized with padded shoulder blocks applied to the acromium processes.  The patient was placed in the semi-lithotomy position in [place].  The perineum and vagina were prepped with CholoraPrep. The patient was draped after the CholoraPrep had been allowed to dry for 3 minutes.  A Time Out was held and the above information confirmed.  The urethra was prepped with Betadine. Foley catheter was placed.  A sterile speculum was placed in the vagina.  The cervix was grasped with a single-tooth tenaculum. The cervix was dilated with Kennon Rounds dilators.  The ZUMI uterine manipulator with a medium colpotomizer ring was placed without difficulty.  A pneum occluder balloon was placed over the manipulator.  OG tube placement was confirmed and to suction.   Next, a 10 mm skin incision was made 1 cm below the subcostal margin in the midclavicular line.  The 5 mm Optiview port and scope was used for direct entry.  Opening pressure was under 10 mm CO2.  The abdomen was insufflated and the findings were noted as above.   At this point and all points during the procedure, the patient's intra-abdominal pressure did not exceed 15 mmHg. Next, an 8 mm skin incision was made superior to the umbilicus and a right and left port were placed about 8 cm lateral to the robot port on the right and left side.   The 5 mm assist trocar was exchanged for a 10-12 mm port. All ports were placed under direct visualization.  The patient was placed in  steep Trendelenburg.  Bowel was folded away into the upper abdomen.  The robot was docked in the normal manner.  Pelvic washings were collected.   The right peritoneum was opened parallel to the IP ligament to open the retroperitoneal space. The round ligament was preserved.  The ureter was noted to be on the medial leaf of the broad ligament.  The peritoneum above the ureter was incised and stretched and the infundibulopelvic ligament was skeletonized, cauterized and cut.  The mesosalpinx was cauterized until just lateral to the uterine fundus. The fallopian tube and utero-ovarian ligaments were isolated, cauterized, and transected. The filmy adhesions of the right ovarian mass to the posterior uterus, cul de sac, and right pelvic sidewall were sharply lysed, freeing the adnexa. This was placed in a 25mm endocatch bag.  Attention was then turned to the left. The left tube was elevated and bipolar and monopolar electrocautery were used to cauterize and transected, freeing the fallopian tube from the underlying ovary and mesosalpinx until just lateral to the uterine fundus. The tube was then cauterized and transected and removed through the assist trocar. The pelvis was inspected, irrigated, and excellent hemostasis assured.   Instruments other than the camera were removed under direct visualization. The endocatch bag was brought through the assist trocar. A scalpel was used to incise the cyst and drain it in a contained manner. Ultimately the adnexa was removed from the bag and sent to pathology for frozen section.   The camera was removed. The robot was undocked. The fascia at the 10-12 mm port was closed with 0 Vicryl on a UR-5 needle.  The subcuticular tissue was closed with 4-0 Vicryl and the skin was closed with 4-0 Monocryl in a subcuticular manner.  Dermabond was applied.    The vagina was swabbed with minimal bleeding noted.   All sponge, lap and needle counts were correct x  3. Foley catheter was removed.  The patient was transferred to the recovery room in stable condition.  Jeral Pinch, MD

## 2021-05-15 NOTE — Anesthesia Procedure Notes (Signed)
Procedure Name: Intubation Date/Time: 05/15/2021 10:50 AM Performed by: Pilar Grammes, CRNA Pre-anesthesia Checklist: Patient identified, Emergency Drugs available, Suction available, Patient being monitored and Timeout performed Patient Re-evaluated:Patient Re-evaluated prior to induction Oxygen Delivery Method: Circle system utilized Preoxygenation: Pre-oxygenation with 100% oxygen Induction Type: IV induction Ventilation: Mask ventilation without difficulty Laryngoscope Size: Miller and Mac Grade View: Grade I Tube type: Oral Tube size: 7.0 mm Number of attempts: 1 Airway Equipment and Method: Stylet Placement Confirmation: positive ETCO2, ETT inserted through vocal cords under direct vision, CO2 detector and breath sounds checked- equal and bilateral Secured at: 20 cm Tube secured with: Tape Dental Injury: Teeth and Oropharynx as per pre-operative assessment

## 2021-05-15 NOTE — Discharge Instructions (Signed)
05/15/2021  If you feel that pain is limiting you from being up and moving around, please let us know when we call on 6/17. We can re-discuss need for blood thinner for 1-2 weeks.  Return to work: 2-6 weeks  Activity: 1. Be up and out of the bed during the day.  Take a nap if needed.  You may walk up steps but be careful and use the hand rail.  Stair climbing will tire you more than you think, you may need to stop part way and rest.   2. No lifting or straining for 6 weeks (no more than 10-15 pounds).  3. No driving for until you are off narcotics and can brake safely. For most people this is 5-10 weeks.   4. Shower daily.  Use soap and water on your incision and pat dry; don't rub.   5. No sexual activity and nothing in the vagina for 4 weeks.  Medications:  - Take ibuprofen and tylenol first line for pain control. Take these regularly (every 6 hours) to decrease the build up of pain.  - If necessary, for severe pain not relieved by ibuprofen, take oxycodone.  - While taking percocet you should take sennakot every night to reduce the likelihood of constipation. If this causes diarrhea, stop its use.  Diet: 1. Low sodium Heart Healthy Diet is recommended.  2. It is safe to use a laxative if you have difficulty moving your bowels.   Wound Care: 1. Keep clean and dry.  Shower daily.  Reasons to call the Doctor:  Fever - Oral temperature greater than 100.4 degrees Fahrenheit Foul-smelling vaginal discharge Difficulty urinating Nausea and vomiting Increased pain at the site of the incision that is unrelieved with pain medicine. Difficulty breathing with or without chest pain New calf pain especially if only on one side Sudden, continuing increased vaginal bleeding with or without clots.   Follow-up: 1. See Jeral Pinch in 3 weeks as previously scheduled.  Contacts: For questions or concerns you should contact:  Dr. Jeral Pinch at [phone] After hours and on  week-ends call [phone] and ask to speak to the physician on call for Gynecologic Oncology

## 2021-05-16 ENCOUNTER — Telehealth: Payer: Self-pay

## 2021-05-16 ENCOUNTER — Encounter (HOSPITAL_COMMUNITY): Payer: Self-pay | Admitting: Gynecologic Oncology

## 2021-05-16 LAB — SURGICAL PATHOLOGY

## 2021-05-16 LAB — CYTOLOGY - NON PAP

## 2021-05-16 NOTE — Telephone Encounter (Signed)
Spoke with Maria Bright this morning. She states she is eating, drinking and urinating well. She has not had a BM yet but she is taking senokot as prescribed and encouraged her to drink plenty of water. She denies fever or chills. Incisions are dry and intact. She states she did have a small amount of bleeding at her umbilical site when she got home but it has stopped. Her pain is controlled with tylenol and Advil.   She is concerned about developing a DVT since she has had one in the past. She is wearing compression socks and ambulating hourly. Instructed her to call us if she noticed calf pain especially on one side.  She will be sending over FMLA paperwork to be completed.  Instructed to call office with any fever, chills, purulent drainage, uncontrolled pain or any other questions or concerns. Patient verbalizes understanding.   Pt aware of post op appointments as well as the office number [phone] and after hours number [phone] to call if she has any questions or concerns

## 2021-05-19 ENCOUNTER — Telehealth: Payer: Self-pay | Admitting: Gynecologic Oncology

## 2021-05-19 NOTE — Telephone Encounter (Signed)
Called the patient. Discussed pathology results. She is recovering well after surgery. All questions answered.  Jeral Pinch MD Gynecologic Oncology

## 2021-05-21 ENCOUNTER — Encounter: Payer: Self-pay | Admitting: Gynecologic Oncology

## 2021-05-21 ENCOUNTER — Inpatient Hospital Stay: Payer: BC Managed Care – PPO

## 2021-05-21 ENCOUNTER — Telehealth: Payer: Self-pay

## 2021-05-21 ENCOUNTER — Other Ambulatory Visit: Payer: Self-pay

## 2021-05-21 VITALS — BP 120/89 | HR 89 | Temp 96.7°F | Resp 18 | Ht 66.0 in | Wt 306.5 lb

## 2021-05-21 DIAGNOSIS — Z4801 Encounter for change or removal of surgical wound dressing: Secondary | ICD-10-CM

## 2021-05-21 NOTE — Progress Notes (Unsigned)
Pt's incision above the umbilicus is open ~0/7 inch with granulation tissue present . Incision pink. No drainage present. The incision line was approximated and derma bond applied and allowed to dry.  Pt to call if she has any issues with incisions.

## 2021-05-21 NOTE — Telephone Encounter (Signed)
Spoke with Maria Bright and her umbilical incision is ~2/4" open.  It is not bleeding or draining any fluid. Afebrile. She has applied steri strip to the area. The steri strip is not adhere well due to the unevenness of the skin. Pt up loaded a picture to [place] for Dr. Berline Lopes to review. Told her that Dr. Berline Lopes and Lenna Sciara are in the [place] today will have them review this afternoon. Will call her with recommendations.

## 2021-05-22 ENCOUNTER — Telehealth: Payer: Self-pay | Admitting: *Deleted

## 2021-05-22 NOTE — Telephone Encounter (Signed)
Emailed the patients FMLA to her and her supervisor per her request. Called and left the patient

## 2021-05-23 ENCOUNTER — Telehealth: Payer: Self-pay

## 2021-05-23 ENCOUNTER — Other Ambulatory Visit: Payer: Self-pay

## 2021-05-23 ENCOUNTER — Inpatient Hospital Stay: Payer: BC Managed Care – PPO

## 2021-05-23 ENCOUNTER — Encounter: Payer: Self-pay | Admitting: Gynecologic Oncology

## 2021-05-23 DIAGNOSIS — N83201 Unspecified ovarian cyst, right side: Secondary | ICD-10-CM | POA: Diagnosis present

## 2021-05-23 DIAGNOSIS — R300 Dysuria: Secondary | ICD-10-CM

## 2021-05-23 DIAGNOSIS — N83202 Unspecified ovarian cyst, left side: Secondary | ICD-10-CM | POA: Diagnosis present

## 2021-05-23 DIAGNOSIS — D259 Leiomyoma of uterus, unspecified: Secondary | ICD-10-CM | POA: Diagnosis not present

## 2021-05-23 LAB — URINALYSIS, COMPLETE (UACMP) WITH MICROSCOPIC
Bacteria, UA: NONE SEEN
Bilirubin Urine: NEGATIVE
Glucose, UA: NEGATIVE mg/dL
Hgb urine dipstick: NEGATIVE
Ketones, ur: NEGATIVE mg/dL
Leukocytes,Ua: NEGATIVE
Nitrite: NEGATIVE
Protein, ur: NEGATIVE mg/dL
Specific Gravity, Urine: 1.021 (ref 1.005–1.030)
pH: 6 (ref 5.0–8.0)

## 2021-05-23 NOTE — Telephone Encounter (Signed)
Pt came to the office on 05-21-21 and had derma bond reapplied to the incision. See progress note dated 05-21-21.

## 2021-05-23 NOTE — Telephone Encounter (Signed)
Left message requesting return call for UA results.

## 2021-05-23 NOTE — Telephone Encounter (Signed)
Spoke with Maria Bright about her MyChart message regarding a bladder infection, urgency and heaviness. She will [facility] lab at 1:45 pm today to provide a urine sample. She is agreeable to this. Instructed we will call her with the results. Patient verbalized understanding.

## 2021-05-23 NOTE — Telephone Encounter (Signed)
Spoke with Maria Bright about UA results. No evidence of infection on UA. Urine culture has also been sent but has not resulted yet. We will notify her when those results come in. Encouraged her to drink plenty of fluids. Patient verbalized understanding and will call with any questions or concerns.

## 2021-05-25 ENCOUNTER — Other Ambulatory Visit: Payer: Self-pay | Admitting: Internal Medicine

## 2021-05-25 LAB — URINE CULTURE: Culture: <10000 — AB

## 2021-05-25 NOTE — Telephone Encounter (Signed)
>   6 months overdue for office visit. Appt is due. Last RF 11/26/20 #90 1 RF. Sent message to pt via MyChart to make appointment. Gave 30 day courtesy refill. Requested Prescriptions  Pending Prescriptions Disp Refills   buPROPion (WELLBUTRIN XL) 300 MG 24 hr tablet [Pharmacy Med Name: BUPROPION HCL XL 300 MG TABLET] 90 tablet 1    Sig: TAKE 1 TABLET BY MOUTH EVERY DAY      Psychiatry: Antidepressants - bupropion Passed - 05/25/2021 11:20 AM      Passed - Completed PHQ-2 or PHQ-9 in the last 360 days      Passed - Last BP in normal range    BP Readings from Last 1 Encounters:  05/21/21 120/89          Passed - Valid encounter within last 6 months    Recent Outpatient Visits           1 month ago [facility] [place], [place], [place]

## 2021-05-26 ENCOUNTER — Encounter: Payer: Self-pay | Admitting: Internal Medicine

## 2021-05-26 MED ORDER — LISDEXAMFETAMINE DIMESYLATE 20 MG PO CAPS: 20.0000 mg | ORAL_CAPSULE | Freq: Two times a day (BID) | ORAL | 0 refills | Status: DC

## 2021-05-28 ENCOUNTER — Encounter: Payer: Self-pay | Admitting: Gynecologic Oncology

## 2021-05-30 ENCOUNTER — Encounter: Payer: Self-pay | Admitting: Gynecologic Oncology

## 2021-06-03 ENCOUNTER — Telehealth: Payer: Self-pay

## 2021-06-03 ENCOUNTER — Encounter: Payer: Self-pay | Admitting: Gynecologic Oncology

## 2021-06-03 NOTE — Telephone Encounter (Signed)
Spoke with Sharyn Lull regarding her work note and restrictions. Note has been faxed per patient request to Gretel Acre at [phone].

## 2021-06-04 ENCOUNTER — Encounter: Payer: Self-pay | Admitting: Gynecologic Oncology

## 2021-06-06 ENCOUNTER — Ambulatory Visit: Payer: BC Managed Care – PPO | Admitting: Gynecologic Oncology

## 2021-06-11 ENCOUNTER — Encounter: Payer: Self-pay | Admitting: Gynecologic Oncology

## 2021-06-12 ENCOUNTER — Ambulatory Visit
Admission: RE | Admit: 2021-06-12 | Discharge: 2021-06-12 | Disposition: A | Discharge status: Home / Self Care | Payer: BC Managed Care – PPO | Source: Ambulatory Visit | Attending: Internal Medicine | Admitting: Internal Medicine

## 2021-06-12 ENCOUNTER — Other Ambulatory Visit: Payer: Self-pay

## 2021-06-12 DIAGNOSIS — Z1231 Encounter for screening mammogram for malignant neoplasm of breast: Secondary | ICD-10-CM

## 2021-06-12 NOTE — Progress Notes (Signed)
Gynecologic Oncology Return Clinic Visit  06/13/21  Reason for Visit: Postoperative follow-up  Treatment History: RA-lsc right salpingo-oophorectomy, left salpingectomy on 6/16 for complex adnexal mass.  Final pathology revealed benign serous cystadenoma.  Interval History: Presents today for follow-up after surgery.  Had several days of urinary symptoms, UTI was ruled out.  These have since resolved.  Patient reports overall doing very well.  She felt sore/tender for about a week after surgery, feels much better now.  She returned to work from home last week and is going into the office part-time this week.  She is tolerating a regular diet without nausea or emesis.  She is continuing to have some constipation for which she is using stool softener.  She denies any bladder symptoms currently.  Past Medical/Surgical History: Past Medical History:  Diagnosis Date   Abnormal uterine bleeding    ADHD (attention deficit hyperactivity disorder)    Anxiety    Asthma    Bone spur    Complication of anesthesia    30 years ago ; woke up in middle of wisdom tooth extraction    DDD (degenerative disc disease), cervical    Depression    DVT, lower extremity, recurrent, right ([place]) 07/06/2020   acute deep vein thrombosis involving the right posterior tibial veins, and right peroneal veins   Dyspnea    due to allergies, asthma   Fibromyalgia    GERD (gastroesophageal reflux disease)    Heart palpitations    has not been seen for it   History of peripheral neuropathy    Hypertension    never has been treated with medications   Migraine    Ovarian cyst    Polycystic disease, ovaries    POC    Seasonal allergies    Sleep apnea    CPAP    Uterine fibroid     Past Surgical History:  Procedure Laterality Date   ANTERIOR CERVICAL DECOMP/DISCECTOMY FUSION  06/24/2020   C5-6 ACDF (Dr. Ivan Croft)   CHOLECYSTECTOMY N/A 06/30/2017   Procedure: LAPAROSCOPIC CHOLECYSTECTOMY;  Surgeon:  Mickeal Skinner, MD;  Location: [facility];  Service: General;  Laterality: N/A;   DILITATION & CURRETTAGE/HYSTROSCOPY WITH HYDROTHERMAL ABLATION N/A 11/20/2020   Procedure: DILATATION & CURETTAGE/HYSTEROSCOPY WITH HYDROTHERMAL ABLATION AND RESECTION OF FIBROID WITH MYOSURE;  Surgeon: [name], MD;  Location: [place];  Service: Gynecology;  Laterality: N/A;   WISDOM TOOTH EXTRACTION     XI ROBOTIC ASSISTED OOPHORECTOMY Right 05/15/2021   Procedure: XI ROBOTIC ASSISTED RIGHT SALPINGO-OOPHORECTOMY;  Surgeon: Lafonda Mosses, MD;  Location: [facility];  Service: Gynecology;  Laterality: Right;   XI ROBOTIC ASSISTED SALPINGECTOMY Bilateral 05/15/2021   Procedure: XI ROBOTIC ASSISTED LEFT SALPINGECTOMY;  Surgeon: Lafonda Mosses, MD;  Location: [facility];  Service: Gynecology;  Laterality: Bilateral;    Family History  Problem Relation Age of Onset   Arthritis Mother    Hyperlipidemia Mother    Heart disease Mother    Hypertension Mother    Rheum arthritis Maternal Aunt    Hyperlipidemia Paternal Aunt    Diabetes Paternal Aunt    Rheum arthritis Maternal Grandmother    Heart disease Maternal Grandmother    Arthritis Maternal Grandfather    Hyperlipidemia Maternal Grandfather    Heart disease Maternal Grandfather    Hypertension Maternal Grandfather    Prostate cancer Paternal Uncle    Colon cancer Neg Hx    Breast cancer Neg Hx    Pancreatic cancer Neg Hx  Endometrial cancer Neg Hx    Ovarian cancer Neg Hx     Social History   Socioeconomic History   Marital status: Married    Spouse name: Not on file   Number of children: Not on file   Years of education: Not on file   Highest education level: Not on file  Occupational History   Not on file  Tobacco Use   Smoking status: Never   Smokeless tobacco: Never  Vaping Use   Vaping Use: Never used  Substance and Sexual Activity   Alcohol use: No    Comment: seldom    Drug use: No   Sexual activity: Yes    Birth  control/protection: None, Surgical    Comment: hUSBAND VASECTOMY  Other Topics Concern   Not on file  Social History Narrative   Not on file   Social Determinants of Health   Financial Resource Strain: Not on file  Food Insecurity: Not on file  Transportation Needs: Not on file  Physical Activity: Not on file  Stress: Not on file  Social Connections: Not on file    Current Medications:  Current Outpatient Medications:    acetaminophen (TYLENOL) 500 MG tablet, Take 1,000 mg by mouth every 6 (six) hours as needed for mild pain., Disp: , Rfl:    albuterol (VENTOLIN HFA) 108 (90 Base) MCG/ACT inhaler, Inhale 2 puffs into the lungs every 6 (six) hours as needed for wheezing or shortness of breath., Disp: 8 g, Rfl: 5   buPROPion (WELLBUTRIN XL) 300 MG 24 hr tablet, TAKE 1 TABLET BY MOUTH EVERY DAY, Disp: 30 tablet, Rfl: 0   cetirizine (ZYRTEC) 10 MG tablet, Take 10 mg by mouth at bedtime., Disp: , Rfl:    clotrimazole (LOTRIMIN) 1 % cream, Apply 1 application topically 2 (two) times daily as needed (irritation)., Disp: , Rfl:    desvenlafaxine (PRISTIQ) 100 MG 24 hr tablet, Take 1 tablet (100 mg total) by mouth every evening., Disp: 90 tablet, Rfl: 0   fexofenadine (ALLEGRA) 180 MG tablet, Take 180 mg by mouth daily., Disp: , Rfl:    Fluticasone Furoate (ARNUITY ELLIPTA) 100 MCG/ACT AEPB, Take 1 Inhaler by mouth daily as needed (asthma)., Disp: , Rfl:    ibuprofen (ADVIL) 800 MG tablet, Take 1 tablet (800 mg total) by mouth every 8 (eight) hours as needed for moderate pain (pain). For AFTER surgery only, Disp: 30 tablet, Rfl: 1   lisdexamfetamine (VYVANSE) 20 MG capsule, Take 1 capsule (20 mg total) by mouth 2 (two) times daily., Disp: 60 capsule, Rfl: 0   omeprazole (PRILOSEC OTC) 20 MG tablet, Take 20 mg by mouth daily as needed (heartburn)., Disp: , Rfl:    diphenhydrAMINE (BENADRYL) 25 MG tablet, Take 50 mg by mouth every 6 (six) hours as needed for itching or allergies. (Patient not  taking: No sig reported), Disp: , Rfl:    EPINEPHrine 0.3 mg/0.3 mL IJ SOAJ injection, Inject 0.3 mg into the muscle as needed for anaphylaxis. (Patient not taking: No sig reported), Disp: 1 each, Rfl: 2   fluticasone (FLONASE) 50 MCG/ACT nasal spray, Place 2 sprays into both nostrils daily. (Patient not taking: No sig reported), Disp: 16 g, Rfl: 6   hydrocortisone cream 1 %, Apply 1 application topically 2 (two) times daily as needed for itching. (Patient not taking: No sig reported), Disp: , Rfl:    metaxalone (SKELAXIN) 800 MG tablet, Take 1 tablet (800 mg total) by mouth daily as needed for muscle spasms. (Patient  not taking: No sig reported), Disp: 30 tablet, Rfl: 0   propranolol (INDERAL) 10 MG tablet, TAKE 1 TABLET (10 MG TOTAL) BY MOUTH DAILY AS NEEDED (ANXIETY). ANXIETY (Patient not taking: Reported on 06/11/2021), Disp: 90 tablet, Rfl: 0  Review of Systems: Denies appetite changes, fevers, chills, fatigue, unexplained weight changes. Denies hearing loss, neck lumps or masses, mouth sores, ringing in ears or voice changes. Denies cough or wheezing.  Denies shortness of breath. Denies chest pain or palpitations. Denies leg swelling. Denies abdominal distention, pain, blood in stools, constipation, diarrhea, nausea, vomiting, or early satiety. Denies pain with intercourse, dysuria, frequency, hematuria or incontinence. Denies hot flashes, pelvic pain, vaginal bleeding or vaginal discharge.   Denies joint pain, back pain or muscle pain/cramps. Denies itching, rash, or wounds. Denies dizziness, headaches, numbness or seizures. Denies swollen lymph nodes or glands, denies easy bruising or bleeding. Denies anxiety, depression, confusion, or decreased concentration.  Physical Exam: BP 128/82   Pulse 82   Temp 99.4 F (37.4 C) (Tympanic)   Resp 18   Ht 5\' 6"  (1.676 m)   Wt (!) 308 lb 9.6 oz (140 kg)   SpO2 100%   BMI 49.81 kg/m  General: Alert, oriented, no acute distress. HEENT:  Normocephalic, atraumatic, sclera anicteric. Chest: Unlabored breathing on room air. Abdomen: Obese, soft, nontender.  Normoactive bowel sounds.  No masses or hepatosplenomegaly appreciated.  Well-healing upper scopic incisions. Extremities: Grossly normal range of motion.  Warm, well perfused.  No edema bilaterally.  Laboratory & Radiologic Studies: None new  Assessment & Plan: Maria Bright is a 46 y.o. woman who is approximately a month status post robotic surgery including unilateral oophorectomy and bilateral salpingectomy for a complex adnexal mass with benign pathology.  The patient is doing very well from a postoperative standpoint.  We discussed continued activity restrictions until she is 6 weeks out from surgery.  Also reassured her that fatigue is normal part of the recovery process.  Reviewed pathology with her again and patient was given a copy of her pathology report.  I am releasing her back to her gynecologist for routine health maintenance.  28 minutes of total time was spent for this patient encounter, including preparation, face-to-face counseling with the patient and coordination of care, and documentation of the encounter.  Jeral Pinch, MD  Division of Gynecologic Oncology  Department of Obstetrics and Gynecology  [facility]

## 2021-06-13 ENCOUNTER — Inpatient Hospital Stay: Payer: BC Managed Care – PPO | Attending: Gynecologic Oncology | Admitting: Gynecologic Oncology

## 2021-06-13 ENCOUNTER — Encounter: Payer: Self-pay | Admitting: Gynecologic Oncology

## 2021-06-13 VITALS — BP 128/82 | HR 82 | Temp 99.4°F | Resp 18 | Ht 66.0 in | Wt 308.6 lb

## 2021-06-13 DIAGNOSIS — D270 Benign neoplasm of right ovary: Secondary | ICD-10-CM

## 2021-06-13 DIAGNOSIS — Z9079 Acquired absence of other genital organ(s): Secondary | ICD-10-CM

## 2021-06-13 DIAGNOSIS — Z90721 Acquired absence of ovaries, unilateral: Secondary | ICD-10-CM

## 2021-06-13 NOTE — Patient Instructions (Signed)
You are healing great from surgery!  I am releasing you back to care with your gynecologist.  Please let me know if you need anything else in the future.

## 2021-06-18 ENCOUNTER — Other Ambulatory Visit: Payer: Self-pay | Admitting: Family

## 2021-06-25 ENCOUNTER — Ambulatory Visit: Payer: BC Managed Care – PPO | Admitting: Internal Medicine

## 2021-06-25 ENCOUNTER — Other Ambulatory Visit: Payer: Self-pay

## 2021-06-25 ENCOUNTER — Encounter: Payer: Self-pay | Admitting: Internal Medicine

## 2021-06-25 DIAGNOSIS — L304 Erythema intertrigo: Secondary | ICD-10-CM

## 2021-06-25 DIAGNOSIS — M5442 Lumbago with sciatica, left side: Secondary | ICD-10-CM

## 2021-06-25 DIAGNOSIS — M5441 Lumbago with sciatica, right side: Secondary | ICD-10-CM

## 2021-06-25 DIAGNOSIS — E282 Polycystic ovarian syndrome: Secondary | ICD-10-CM

## 2021-06-25 DIAGNOSIS — K219 Gastro-esophageal reflux disease without esophagitis: Secondary | ICD-10-CM

## 2021-06-25 DIAGNOSIS — E7800 Pure hypercholesterolemia, unspecified: Secondary | ICD-10-CM

## 2021-06-25 DIAGNOSIS — G8929 Other chronic pain: Secondary | ICD-10-CM

## 2021-06-25 DIAGNOSIS — F419 Anxiety disorder, unspecified: Secondary | ICD-10-CM

## 2021-06-25 DIAGNOSIS — Z86718 Personal history of other venous thrombosis and embolism: Secondary | ICD-10-CM

## 2021-06-25 DIAGNOSIS — J4520 Mild intermittent asthma, uncomplicated: Secondary | ICD-10-CM | POA: Diagnosis not present

## 2021-06-25 DIAGNOSIS — I10 Essential (primary) hypertension: Secondary | ICD-10-CM

## 2021-06-25 DIAGNOSIS — F32A Depression, unspecified: Secondary | ICD-10-CM

## 2021-06-25 DIAGNOSIS — F901 Attention-deficit hyperactivity disorder, predominantly hyperactive type: Secondary | ICD-10-CM

## 2021-06-25 MED ORDER — BUPROPION HCL ER (XL) 300 MG PO TB24: 300.0000 mg | ORAL_TABLET | Freq: Every day | ORAL | 1 refills | Status: DC

## 2021-06-25 MED ORDER — NYSTATIN 100000 UNIT/GM EX POWD: 1.0000 "application " | Freq: Three times a day (TID) | CUTANEOUS | 5 refills | Status: DC

## 2021-06-25 MED ORDER — LISDEXAMFETAMINE DIMESYLATE 20 MG PO CAPS: 20.0000 mg | ORAL_CAPSULE | Freq: Two times a day (BID) | ORAL | 0 refills | Status: DC

## 2021-06-25 NOTE — Patient Instructions (Signed)
Intertrigo °Intertrigo is skin irritation (inflammation) that happens in warm, moist areas of the body. The irritation can cause a rash and make skin raw and itchy. The rash is usually pink or red. It happens mostly between folds of skin or where skin rubs together, such as: °Between the toes. °In the armpits. °In the groin area. °Under the belly. °Under the breasts. °Around the butt area. °This condition is not passed from person to person (is not contagious). °What are the causes? °Heat, moisture, rubbing, and not enough air movement. °The condition can be made worse by: °Sweat. °Bacteria. °A fungus, such as yeast. °What increases the risk? °Moisture in your skin folds. °You are more likely to develop this condition if you: °Have diabetes. °Are overweight. °Are not able to move around. °Live in a warm and moist climate. °Wear splints, braces, or other medical devices. °Are not able to control your pee (urine) or poop (stool). °What are the signs or symptoms? °A pink or red skin rash in the skin fold or near the skin fold. °Raw or scaly skin. °Itching. °A burning feeling. °Bleeding. °Leaking fluid. °A bad smell. °How is this treated? °Cleaning and drying your skin. °Taking an antibiotic medicine or using an antibiotic skin cream for a bacterial infection. °Using an antifungal cream on your skin or taking pills for an infection that was caused by a fungus, such as yeast. °Using a steroid ointment to stop the itching and irritation. °Separating the skin fold with a clean cotton cloth to absorb moisture and allow air to flow into the area. °Follow these instructions at home: °Keep the affected area clean and dry. °Do not scratch your skin. °Stay cool as much as you can. Use an air conditioner or a fan, if you have one. °Apply over-the-counter and prescription medicines only as told by your doctor. °If you were prescribed an antibiotic medicine, use it as told by your doctor. Do not stop using the antibiotic even if  your condition starts to get better. °Keep all follow-up visits as told by your doctor. This is important. °How is this prevented? ° °Stay at a healthy weight. °Take care of your feet. This is very important if you have diabetes. You should: °Wear shoes that fit well. °Keep your feet dry. °Wear clean cotton or wool socks. °Protect the skin in your groin and butt area as told by your doctor. To do this: °Follow a regular cleaning routine. °Use creams, powders, or ointments that protect your skin. °Change protection pads often. °Do not wear tight clothes. Wear clothes that: °Are loose. °Take moisture away from your body. °Are made of cotton. °Wear a bra that gives good support, if needed. °Shower and dry yourself well after being active. Use a hair dryer on a cool setting to dry between skin folds. °Keep your blood sugar under control if you have diabetes. °Contact a doctor if: °Your symptoms do not get better with treatment. °Your symptoms get worse or they spread. °You notice more redness and warmth. °You have a fever. °Summary °Intertrigo is skin irritation that occurs when folds of skin rub together. °This condition is caused by heat, moisture, and rubbing. °This condition may be treated by cleaning and drying your skin and with medicines. °Apply over-the-counter and prescription medicines only as told by your doctor. °Keep all follow-up visits as told by your doctor. This is important. °This information is not intended to replace advice given to you by your health care provider. Make sure you discuss   any questions you have with your health care provider. °Document Revised: 08/25/2018 Document Reviewed: 08/25/2018 °Elsevier Patient Education © 2022 [employer] ° °

## 2021-06-25 NOTE — Progress Notes (Signed)
Subjective:    Patient ID: Maria Bright, female    DOB: 1976/02/02, 45 y.o.   MRN: [id number]  HPI  Patient presents to clinic today for follow-up of chronic conditions.  ADHD: Managed on Vyvanse.  She does feel like her current dose is working well for her when she takes it.  She is not currently seeing a psychiatrist.  Anxiety and Depression: Chronic, managed on Pristiq and Wellbutrin.  She is not currently seeing a therapist.  She denies SI/HI.  Chronic Low Back Pain: Persistent.  She is not currently taking any medications for this.  She is considering restarting her Gabapentin.  She is not currently following with a chiropractor or orthopedist at this time.  GERD: Triggered by spicy foods.  She takes Omeprazole as needed with good relief of symptoms.  There is no upper GI on file.  HLD: Her last LDL was 145, triglycerides 96, 05/2021. She is not taking any cholesterol lowering medication at this time. She tries to consume a low fat diet.  HTN: Her BP today is 138/88.  She is taking Propranolol as prescribed.  ECG from reviewed.  PCOS: She is not currently taking any medications for this.  Asthma: Chronic, manged on Arnuity and Albuterol. There are no PFT"s on file.  OSA: She is wearing her CPAP as prescribed. She reports she does need a new machine. She would like referral for another sleep study. There is no sleep study on file.   History of DVT: Not currently on anticoagulation. She plans to start daily ASA.  She also reports an intermittent rash underneath her breast. She is using Nizoral powder and is requesting a refill of this today.  Review of Systems  Past Medical History:  Diagnosis Date   Abnormal uterine bleeding    ADHD (attention deficit hyperactivity disorder)    Anxiety    Asthma    Bone spur    Complication of anesthesia    30 years ago ; woke up in middle of wisdom tooth extraction    DDD (degenerative disc disease), cervical    Depression    DVT,  lower extremity, recurrent, right ([place]) 07/06/2020   acute deep vein thrombosis involving the right posterior tibial veins, and right peroneal veins   Dyspnea    due to allergies, asthma   Fibromyalgia    GERD (gastroesophageal reflux disease)    Heart palpitations    has not been seen for it   History of peripheral neuropathy    Hypertension    never has been treated with medications   Migraine    Ovarian cyst    Polycystic disease, ovaries    POC    Seasonal allergies    Sleep apnea    CPAP    Uterine fibroid     Current Outpatient Medications  Medication Sig Dispense Refill   acetaminophen (TYLENOL) 500 MG tablet Take 1,000 mg by mouth every 6 (six) hours as needed for mild pain.     albuterol (VENTOLIN HFA) 108 (90 Base) MCG/ACT inhaler Inhale 2 puffs into the lungs every 6 (six) hours as needed for wheezing or shortness of breath. 8 g 5   buPROPion (WELLBUTRIN XL) 300 MG 24 hr tablet TAKE 1 TABLET BY MOUTH EVERY DAY 30 tablet 0   cetirizine (ZYRTEC) 10 MG tablet Take 10 mg by mouth at bedtime.     clotrimazole (LOTRIMIN) 1 % cream Apply 1 application topically 2 (two) times daily as needed (irritation).  desvenlafaxine (PRISTIQ) 100 MG 24 hr tablet Take 1 tablet (100 mg total) by mouth every evening. 90 tablet 0   diphenhydrAMINE (BENADRYL) 25 MG tablet Take 50 mg by mouth every 6 (six) hours as needed for itching or allergies. (Patient not taking: No sig reported)     EPINEPHrine 0.3 mg/0.3 mL IJ SOAJ injection Inject 0.3 mg into the muscle as needed for anaphylaxis. (Patient not taking: No sig reported) 1 each 2   fexofenadine (ALLEGRA) 180 MG tablet Take 180 mg by mouth daily.     fluticasone (FLONASE) 50 MCG/ACT nasal spray Place 2 sprays into both nostrils daily. (Patient not taking: No sig reported) 16 g 6   Fluticasone Furoate (ARNUITY ELLIPTA) 100 MCG/ACT AEPB Take 1 Inhaler by mouth daily as needed (asthma).     hydrocortisone cream 1 % Apply 1 application topically  2 (two) times daily as needed for itching. (Patient not taking: No sig reported)     ibuprofen (ADVIL) 800 MG tablet Take 1 tablet (800 mg total) by mouth every 8 (eight) hours as needed for moderate pain (pain). For AFTER surgery only 30 tablet 1   lisdexamfetamine (VYVANSE) 20 MG capsule Take 1 capsule (20 mg total) by mouth 2 (two) times daily. 60 capsule 0   metaxalone (SKELAXIN) 800 MG tablet Take 1 tablet (800 mg total) by mouth daily as needed for muscle spasms. (Patient not taking: No sig reported) 30 tablet 0   omeprazole (PRILOSEC OTC) 20 MG tablet Take 20 mg by mouth daily as needed (heartburn).     propranolol (INDERAL) 10 MG tablet TAKE 1 TABLET (10 MG TOTAL) BY MOUTH DAILY AS NEEDED (ANXIETY). ANXIETY (Patient not taking: Reported on 06/11/2021) 90 tablet 0   No current facility-administered medications for this visit.    Allergies  Allergen Reactions   Lactose Intolerance (Gi)    Latex Hives   Morphine And Related Nausea And Vomiting        Other     Tomatoes, pineapple, oranges, grapefruit, lemons, limes, citrus fruits, and all nightshade vegetables    Adhesive [Tape] Rash    blisters   Nickel Rash    Family History  Problem Relation Age of Onset   Arthritis Mother    Hyperlipidemia Mother    Heart disease Mother    Hypertension Mother    Rheum arthritis Maternal Aunt    Hyperlipidemia Paternal Aunt    Diabetes Paternal Aunt    Rheum arthritis Maternal Grandmother    Heart disease Maternal Grandmother    Arthritis Maternal Grandfather    Hyperlipidemia Maternal Grandfather    Heart disease Maternal Grandfather    Hypertension Maternal Grandfather    Prostate cancer Paternal Uncle    Colon cancer Neg Hx    Breast cancer Neg Hx    Pancreatic cancer Neg Hx    Endometrial cancer Neg Hx    Ovarian cancer Neg Hx     Social History   Socioeconomic History   Marital status: Married    Spouse name: Not on file   Number of children: Not on file   Years of  education: Not on file   Highest education level: Not on file  Occupational History   Not on file  Tobacco Use   Smoking status: Never   Smokeless tobacco: Never  Vaping Use   Vaping Use: Never used  Substance and Sexual Activity   Alcohol use: No    Comment: seldom    Drug use: No  Sexual activity: Yes    Birth control/protection: None, Surgical    Comment: hUSBAND VASECTOMY  Other Topics Concern   Not on file  Social History Narrative   Not on file   Social Determinants of Health   Financial Resource Strain: Not on file  Food Insecurity: Not on file  Transportation Needs: Not on file  Physical Activity: Not on file  Stress: Not on file  Social Connections: Not on file  Intimate Partner Violence: Not on file     Constitutional: Pt reports fatigue. Denies fever, malaise, headache or abrupt weight changes.  HEENT: Denies eye pain, eye redness, ear pain, ringing in the ears, wax buildup, runny nose, nasal congestion, bloody nose, or sore throat. Respiratory: Denies difficulty breathing, shortness of breath, cough or sputum production.   Cardiovascular: Denies chest pain, chest tightness, palpitations or swelling in the hands or feet.  Gastrointestinal: Pt reports intermittent reflux. Denies abdominal pain, bloating, constipation, diarrhea or blood in the stool.  GU: Denies urgency, frequency, pain with urination, burning sensation, blood in urine, odor or discharge. Musculoskeletal: Pt reports chronic low back pain. Denies decrease in range of motion, difficulty with gait, or joint swelling.  Skin: Pt reports intermittent rash under breasts, none today. Denies lesions or ulcercations.  Neurological: Denies dizziness, difficulty with memory, difficulty with speech or problems with balance and coordination.  Psych: Pt has a history of anxiety and depression. Denies SI/HI.  No other specific complaints in a complete review of systems (except as listed in HPI above).      Objective:   Physical Exam BP 138/88 (BP Location: Left Arm, Patient Position: Sitting, Cuff Size: Large)   Pulse 80   Temp (!) 97.1 F (36.2 C) (Temporal)   Resp 18   Ht '5\' 6"'$  (1.676 m)   Wt (!) 312 lb 6.4 oz (141.7 kg)   SpO2 100%   BMI 50.42 kg/m   Wt Readings from Last 3 Encounters:  06/13/21 (!) 308 lb 9.6 oz (140 kg)  05/21/21 (!) 306 lb 8 oz (139 kg)  05/09/21 (!) 315 lb (142.9 kg)    General: Appears her stated age, obese, in NAD. Skin: Warm, dry and intact.  HEENT: Head: normal shape and size; Eyes: sclera white and EOMs intact;  Cardiovascular: Normal rate and rhythm. S1,S2 noted.  No murmur, rubs or gallops noted. No JVD or BLE edema. Pulmonary/Chest: Normal effort and positive vesicular breath sounds. No respiratory distress. No wheezes, rales or ronchi noted.  Abdomen: Normal bowel sounds.  Musculoskeletal: No difficulty with gait.  Neurological: Alert and oriented.  Psychiatric: Mood and affect normal. Behavior is normal. Judgment and thought content normal.   BMET    Component Value Date/Time   NA 136 05/15/2021 0945   K 3.7 05/15/2021 0945   CL 103 05/15/2021 0945   CO2 25 05/15/2021 0945   GLUCOSE 105 (H) 05/15/2021 0945   BUN 15 05/15/2021 0945   CREATININE 0.96 05/15/2021 0945   CREATININE 1.01 06/14/2020 1637   CALCIUM 9.0 05/15/2021 0945   GFRNONAA >60 05/15/2021 0945   GFRAA >60 06/26/2020 1701    Lipid Panel     Component Value Date/Time   CHOL 224 (H) 02/15/2020 1514   TRIG 96.0 02/15/2020 1514   HDL 59.30 02/15/2020 1514   CHOLHDL 4 02/15/2020 1514   VLDL 19.2 02/15/2020 1514   LDLCALC 145 (H) 02/15/2020 1514    CBC    Component Value Date/Time   WBC 5.7 05/15/2021 0945  RBC 5.18 (H) 05/15/2021 0945   HGB 14.6 05/15/2021 0945   HCT 44.4 05/15/2021 0945   PLT 263 05/15/2021 0945   MCV 85.7 05/15/2021 0945   MCH 28.2 05/15/2021 0945   MCHC 32.9 05/15/2021 0945   RDW 14.6 05/15/2021 0945   LYMPHSABS 2.2 06/26/2020 1701    MONOABS 1.1 (H) 06/26/2020 1701   EOSABS 0.3 06/26/2020 1701   BASOSABS 0.1 06/26/2020 1701    Hgb A1C Lab Results  Component Value Date   HGBA1C 5.3 06/14/2020            Assessment & Plan:   [name], NP This visit occurred during the SARS-CoV-2 public health emergency.  Safety protocols were in place, including screening questions prior to the visit, additional usage of staff PPE, and extensive cleaning of exam room while observing appropriate contact time as indicated for disinfecting solutions.

## 2021-06-26 DIAGNOSIS — E785 Hyperlipidemia, unspecified: Secondary | ICD-10-CM | POA: Insufficient documentation

## 2021-06-26 DIAGNOSIS — E662 Morbid (severe) obesity with alveolar hypoventilation: Secondary | ICD-10-CM | POA: Insufficient documentation

## 2021-06-26 DIAGNOSIS — L304 Erythema intertrigo: Secondary | ICD-10-CM | POA: Insufficient documentation

## 2021-06-26 NOTE — Assessment & Plan Note (Signed)
RX for Nystatin powder Encouraged her to keep the ary as dry as possible and avoid friction

## 2021-06-26 NOTE — Assessment & Plan Note (Signed)
Encouraged diet and exercise for weight loss ?

## 2021-06-26 NOTE — Assessment & Plan Note (Signed)
Continue Vyvanse, refilled today

## 2021-06-26 NOTE — Assessment & Plan Note (Signed)
Stable on her current dose of Pristiq and Wellbutrin Support offered

## 2021-06-26 NOTE — Assessment & Plan Note (Signed)
Try to avoid foods that trigger reflux Encourage weight loss as this can help reduce reflux symptoms Continue omeprazole

## 2021-06-26 NOTE — Assessment & Plan Note (Signed)
She plans to start daily aspirin

## 2021-06-26 NOTE — Assessment & Plan Note (Signed)
Controlled on propanolol Reinforced DASH diet and exercise for weight loss

## 2021-06-26 NOTE — Assessment & Plan Note (Signed)
Encouraged her to consume a low-fat

## 2021-06-26 NOTE — Assessment & Plan Note (Signed)
Encourage regular stretching Encouraged weight loss as this can help reduce joint pain She is considering restarting gabapentin

## 2021-06-26 NOTE — Assessment & Plan Note (Signed)
No current treatment, will monitor

## 2021-06-26 NOTE — Assessment & Plan Note (Signed)
Continue Arnuity Ellipta and albuterol as needed

## 2021-07-02 ENCOUNTER — Other Ambulatory Visit: Payer: Self-pay

## 2021-07-03 MED ORDER — PROPRANOLOL HCL 10 MG PO TABS: 10.0000 mg | ORAL_TABLET | Freq: Every day | ORAL | 1 refills | Status: AC | PRN

## 2021-08-14 ENCOUNTER — Encounter: Payer: Self-pay | Admitting: Internal Medicine

## 2021-08-14 ENCOUNTER — Other Ambulatory Visit: Payer: Self-pay | Admitting: Internal Medicine

## 2021-08-15 MED ORDER — LISDEXAMFETAMINE DIMESYLATE 20 MG PO CAPS: 20.0000 mg | ORAL_CAPSULE | Freq: Two times a day (BID) | ORAL | 0 refills | Status: DC

## 2021-08-15 MED ORDER — GABAPENTIN 300 MG PO CAPS: ORAL_CAPSULE | ORAL | 1 refills | Status: DC

## 2021-10-08 ENCOUNTER — Other Ambulatory Visit: Payer: Self-pay | Admitting: Family

## 2021-11-06 ENCOUNTER — Other Ambulatory Visit: Payer: Self-pay | Admitting: Internal Medicine

## 2021-11-06 ENCOUNTER — Encounter: Payer: Self-pay | Admitting: Internal Medicine

## 2021-11-06 MED ORDER — LISDEXAMFETAMINE DIMESYLATE 20 MG PO CAPS: 20.0000 mg | ORAL_CAPSULE | Freq: Two times a day (BID) | ORAL | 0 refills | Status: DC

## 2021-11-06 MED ORDER — BUPROPION HCL ER (XL) 300 MG PO TB24: 300.0000 mg | ORAL_TABLET | Freq: Every day | ORAL | 1 refills | Status: DC

## 2021-11-14 ENCOUNTER — Encounter: Payer: Self-pay | Admitting: Internal Medicine

## 2021-12-30 ENCOUNTER — Other Ambulatory Visit: Payer: Self-pay | Admitting: Internal Medicine

## 2021-12-30 MED ORDER — LISDEXAMFETAMINE DIMESYLATE 20 MG PO CAPS: 20.0000 mg | ORAL_CAPSULE | Freq: Two times a day (BID) | ORAL | 0 refills | Status: DC

## 2021-12-30 MED ORDER — ARNUITY ELLIPTA 100 MCG/ACT IN AEPB: 1.0000 | INHALATION_SPRAY | Freq: Every day | RESPIRATORY_TRACT | 1 refills | Status: DC | PRN

## 2021-12-30 NOTE — Telephone Encounter (Signed)
LOV: 06/25/2021  NOV: None   Last Refill:   11/06/2021 #60 0 Refills   Thanks,   -Mickel Baas

## 2022-01-23 ENCOUNTER — Other Ambulatory Visit: Payer: Self-pay | Admitting: Internal Medicine

## 2022-01-23 NOTE — Telephone Encounter (Signed)
Requested medication (s) are due for refill today:   Yes  Requested medication (s) are on the active medication list:   Yes  Future visit scheduled:   No   Last ordered: 06/25/2021 60 g, 5 refills  Returned because there isn't a protocol assigned to this medication.   Requested Prescriptions  Pending Prescriptions Disp Refills   nystatin (MYCOSTATIN/NYSTOP) powder [Pharmacy Med Name: NYSTATIN 100,000 UNIT/GM POWD] 60 g 5    Sig: Apply 1 application topically 3 (three) times daily.     Off-Protocol Failed - 01/23/2022 12:16 PM      Failed - Medication not assigned to a protocol, review manually.      Passed - Valid encounter within last 12 months    Recent Outpatient Visits           7 months ago Primary hypertension   [facility] [place], Coralie Keens, NP   9 months ago [facility] [place], Coralie Keens, [place]

## 2022-04-07 ENCOUNTER — Ambulatory Visit: Payer: BC Managed Care – PPO | Admitting: Internal Medicine

## 2022-04-07 ENCOUNTER — Encounter: Payer: Self-pay | Admitting: Internal Medicine

## 2022-04-07 VITALS — BP 136/84 | HR 79 | Temp 97.1°F | Ht 66.0 in | Wt 310.0 lb

## 2022-04-07 DIAGNOSIS — Z1231 Encounter for screening mammogram for malignant neoplasm of breast: Secondary | ICD-10-CM

## 2022-04-07 DIAGNOSIS — Z114 Encounter for screening for human immunodeficiency virus [HIV]: Secondary | ICD-10-CM

## 2022-04-07 DIAGNOSIS — Z0001 Encounter for general adult medical examination with abnormal findings: Secondary | ICD-10-CM

## 2022-04-07 DIAGNOSIS — Z1159 Encounter for screening for other viral diseases: Secondary | ICD-10-CM | POA: Diagnosis not present

## 2022-04-07 NOTE — Assessment & Plan Note (Signed)
Encourage low-carb diet and exercise for weight loss ?We will consider Maria Bright, Saxenda or Ozempic pending labs ?

## 2022-04-07 NOTE — Patient Instructions (Signed)

## 2022-04-07 NOTE — Progress Notes (Signed)
Subjective:    Patient ID: Maria Bright, female    DOB: 1976/05/30, 46 y.o.   MRN: [id number]  HPI  Patient presents to clinic today for her annual exam.  Flu: 09/2021 Tetanus: 01/2020 COVID: Pfizer x4 Pap smear: 08/2019 Mammogram: 05/2021 Vision screening: annually Dentist: biannually  Diet: She does eat meat. She does consumes fruits and veggies. She tries to avoid fried foods. She drinks mostly water, milk, coffee and soda. 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   acute deep vein thrombosis involving the right posterior tibial veins, and right peroneal veins   Dyspnea    due to allergies, asthma   Fibromyalgia    GERD (gastroesophageal reflux disease)    Heart palpitations    has not been seen for it   History of peripheral neuropathy    Hypertension    never has been treated with medications   Migraine    Ovarian cyst    Polycystic disease, ovaries    POC    Seasonal allergies    Sleep apnea    CPAP    Uterine fibroid     Current Outpatient Medications  Medication Sig Dispense Refill   acetaminophen (TYLENOL) 500 MG tablet Take 1,000 mg by mouth every 6 (six) hours as needed for mild pain.     albuterol (VENTOLIN HFA) 108 (90 Base) MCG/ACT inhaler Inhale 2 puffs into the lungs every 6 (six) hours as needed for wheezing or shortness of breath. 8 g 5   buPROPion (WELLBUTRIN XL) 300 MG 24 hr tablet Take 1 tablet (300 mg total) by mouth daily. 90 tablet 1   cetirizine (ZYRTEC) 10 MG tablet Take 10 mg by mouth at bedtime.     desvenlafaxine (PRISTIQ) 100 MG 24 hr tablet TAKE 1 TABLET BY MOUTH EVERY DAY IN  THE EVENING 90 tablet 0   diphenhydrAMINE (BENADRYL) 25 MG tablet Take 50 mg by mouth every 6 (six) hours as needed for itching or allergies.     fexofenadine (ALLEGRA) 180 MG tablet Take 180 mg by mouth daily.     fluticasone (FLONASE) 50 MCG/ACT nasal spray Place 2 sprays into both nostrils daily. 16 g 6   Fluticasone Furoate (ARNUITY ELLIPTA) 100 MCG/ACT AEPB Take 1 Inhaler by mouth daily as needed (asthma). 30 each 1   gabapentin (NEURONTIN) 300 MG capsule Take 1 tab p.o. in the a.m., 1 tab p.o. at lunch in 2 tabs p.o. at bedtime 360 capsule 1   hydrocortisone cream 1 % Apply 1 application topically 2 (two) times daily as needed for itching.     ibuprofen (ADVIL) 800 MG tablet Take 1 tablet (800 mg total) by mouth every 8 (eight) hours as needed for moderate pain (pain). For AFTER surgery only 30 tablet 1   lisdexamfetamine (VYVANSE) 20 MG capsule Take 1 capsule (20 mg total) by mouth 2 (two) times daily. 60 capsule 0   nystatin (MYCOSTATIN/NYSTOP) powder APPLY 1 APPLICATION TOPICALLY 3 (THREE) TIMES DAILY. 60 g 5   omeprazole (PRILOSEC OTC) 20 MG tablet Take 20 mg by  mouth daily as needed (heartburn).     propranolol (INDERAL) 10 MG tablet Take 1 tablet (10 mg total) by mouth daily as needed (anxiety). Anxiety 90 tablet 1   No current facility-administered medications for this visit.    Allergies  Allergen Reactions   Lactose Intolerance (Gi)    Latex Hives   Morphine And Related Nausea And Vomiting        Other     Tomatoes, pineapple, oranges, grapefruit, lemons, limes, citrus fruits, and all nightshade vegetables    Adhesive [Tape] Rash    blisters   Nickel Rash    Family History  Problem Relation Age of Onset   Arthritis Mother    Hyperlipidemia Mother    Heart disease Mother    Hypertension Mother    Rheum arthritis Maternal Aunt    Hyperlipidemia Paternal Aunt    Diabetes Paternal Aunt    Rheum arthritis Maternal Grandmother    Heart disease Maternal Grandmother     Arthritis Maternal Grandfather    Hyperlipidemia Maternal Grandfather    Heart disease Maternal Grandfather    Hypertension Maternal Grandfather    Prostate cancer Paternal Uncle    Colon cancer Neg Hx    Breast cancer Neg Hx    Pancreatic cancer Neg Hx    Endometrial cancer Neg Hx    Ovarian cancer Neg Hx     Social History   Socioeconomic History   Marital status: Married    Spouse name: Not on file   Number of children: Not on file   Years of education: Not on file   Highest education level: Not on file  Occupational History   Not on file  Tobacco Use   Smoking status: Never   Smokeless tobacco: Never  Vaping Use   Vaping Use: Never used  Substance and Sexual Activity   Alcohol use: No    Comment: seldom    Drug use: No   Sexual activity: Yes    Birth control/protection: None, Surgical    Comment: hUSBAND VASECTOMY  Other Topics Concern   Not on file  Social History Narrative   Not on file   Social Determinants of Health   Financial Resource Strain: Not on file  Food Insecurity: Not on file  Transportation Needs: Not on file  Physical Activity: Not on file  Stress: Not on file  Social Connections: Not on file  Intimate Partner Violence: Not on file     Constitutional: Denies fever, malaise, fatigue, headache or abrupt weight changes.  HEENT: Denies eye pain, eye redness, ear pain, ringing in the ears, wax buildup, runny nose, nasal congestion, bloody nose, or sore throat. Respiratory: Denies difficulty breathing, shortness of breath, cough or sputum production.   Cardiovascular: Denies chest pain, chest tightness, palpitations or swelling in the hands or feet.  Gastrointestinal: Pt reports intermittent blood in stool. Denies abdominal pain, bloating, constipation, diarrhea  GU: Denies urgency, frequency, pain with urination, burning sensation, blood in urine, odor or discharge. Musculoskeletal: Patient reports chronic low back pain.  Denies decrease in  range of motion, difficulty with gait, muscle pain or joint swelling.  Skin: Denies redness, rashes, lesions or ulcercations.  Neurological: Pt reports difficulty completing tasks. Denies dizziness, difficulty with memory, difficulty with speech or problems with balance and coordination.  Psych: Patient has a history of anxiety and depression.  Denies SI/HI.  No other specific complaints in a complete review of systems (except as listed in HPI above).     Objective:  Physical Exam BP 136/84 (BP Location: Left Arm, Patient Position: Sitting, Cuff Size: Large)   Pulse 79   Temp (!) 97.1 F (36.2 C) (Temporal)   Ht 5\' 6"  (1.676 m)   Wt (!) 310 lb (140.6 kg)   SpO2 99%   BMI 50.04 kg/m   Wt Readings from Last 3 Encounters:  06/25/21 (!) 312 lb 6.4 oz (141.7 kg)  06/13/21 (!) 308 lb 9.6 oz (140 kg)  05/21/21 (!) 306 lb 8 oz (139 kg)    General: Appears her stated age, obese, in NAD. Skin: Warm, dry and intact.  HEENT: Head: normal shape and size; Eyes: sclera white, no icterus, conjunctiva pink, PERRLA and EOMs intact;  Neck:  Neck supple, trachea midline.Thyromegaly noted. Cardiovascular: Normal rate and rhythm. S1,S2 noted.  No murmur, rubs or gallops noted. No JVD or BLE edema.  Pulmonary/Chest: Normal effort and positive vesicular breath sounds. No respiratory distress. No wheezes, rales or ronchi noted.  Abdomen: Soft and nontender. Normal bowel sounds.  Musculoskeletal: Strength 5/5 BUE/BLE.  No difficulty with gait.  Neurological: Alert and oriented. Cranial nerves II-XII grossly intact. Coordination normal.  Psychiatric: Mood and affect normal. Behavior is normal. Judgment and thought content normal.    BMET    Component Value Date/Time   NA 136 05/15/2021 0945   K 3.7 05/15/2021 0945   CL 103 05/15/2021 0945   CO2 25 05/15/2021 0945   GLUCOSE 105 (H) 05/15/2021 0945   BUN 15 05/15/2021 0945   CREATININE 0.96 05/15/2021 0945   CREATININE 1.01 06/14/2020 1637    CALCIUM 9.0 05/15/2021 0945   GFRNONAA >60 05/15/2021 0945   GFRAA >60 06/26/2020 1701    Lipid Panel     Component Value Date/Time   CHOL 224 (H) 02/15/2020 1514   TRIG 96.0 02/15/2020 1514   HDL 59.30 02/15/2020 1514   CHOLHDL 4 02/15/2020 1514   VLDL 19.2 02/15/2020 1514   LDLCALC 145 (H) 02/15/2020 1514    CBC    Component Value Date/Time   WBC 5.7 05/15/2021 0945   RBC 5.18 (H) 05/15/2021 0945   HGB 14.6 05/15/2021 0945   HCT 44.4 05/15/2021 0945   PLT 263 05/15/2021 0945   MCV 85.7 05/15/2021 0945   MCH 28.2 05/15/2021 0945   MCHC 32.9 05/15/2021 0945   RDW 14.6 05/15/2021 0945   LYMPHSABS 2.2 06/26/2020 1701   MONOABS 1.1 (H) 06/26/2020 1701   EOSABS 0.3 06/26/2020 1701   BASOSABS 0.1 06/26/2020 1701    Hgb A1C Lab Results  Component Value Date   HGBA1C 5.3 06/14/2020             Assessment & Plan:   Preventative Health Maintenance:  Encouraged her to get a flu shot in the fall Tetanus UTD COVID-vaccine UTD Pap smear UTD Mammogram due 05/2022, ordered-she will call to schedule Encouraged her to consume a balanced diet and exercise regimen Advised her to see an eye doctor and dentist annually We will check CBC, c-Met, TSH, lipid, A1c, HIV and hep C today  RTC in 6 months, follow-up chronic conditions Nicki Reaper, NP

## 2022-04-08 LAB — LIPID PANEL
Cholesterol: 202 mg/dL — ABNORMAL HIGH (ref <200)
HDL: 67 mg/dL (ref >=50)
LDL Cholesterol (Calc): 118 mg/dL (calc) — ABNORMAL HIGH
Non-HDL Cholesterol (Calc): 135 mg/dL (calc) — ABNORMAL HIGH (ref <130)
Total CHOL/HDL Ratio: 3 (calc) (ref <5.0)
Triglycerides: 75 mg/dL (ref <150)

## 2022-04-08 LAB — COMPLETE METABOLIC PANEL WITH GFR
AG Ratio: 1.6 (calc) (ref 1.0–2.5)
ALT: 17 U/L (ref 6–29)
AST: 16 U/L (ref 10–30)
Albumin: 4.2 g/dL (ref 3.6–5.1)
Alkaline phosphatase (APISO): 99 U/L (ref 31–125)
BUN/Creatinine Ratio: 10 (calc) (ref 6–22)
BUN: 11 mg/dL (ref 7–25)
CO2: 26 mmol/L (ref 20–32)
Calcium: 9 mg/dL (ref 8.6–10.2)
Chloride: 104 mmol/L (ref 98–110)
Creat: 1.11 mg/dL — ABNORMAL HIGH (ref 0.50–0.99)
Globulin: 2.6 g/dL (calc) (ref 1.9–3.7)
Glucose, Bld: 95 mg/dL (ref 65–99)
Potassium: 3.9 mmol/L (ref 3.5–5.3)
Sodium: 139 mmol/L (ref 135–146)
Total Bilirubin: 0.4 mg/dL (ref 0.2–1.2)
Total Protein: 6.8 g/dL (ref 6.1–8.1)
eGFR: 63 mL/min/{1.73_m2} (ref >=60)

## 2022-04-08 LAB — CBC
HCT: 44.1 % (ref 35.0–45.0)
Hemoglobin: 14.2 g/dL (ref 11.7–15.5)
MCH: 28.9 pg (ref 27.0–33.0)
MCHC: 32.2 g/dL (ref 32.0–36.0)
MCV: 89.8 fL (ref 80.0–100.0)
MPV: 10.8 fL (ref 7.5–12.5)
Platelets: 249 10*3/uL (ref 140–400)
RBC: 4.91 10*6/uL (ref 3.80–5.10)
RDW: 13.1 % (ref 11.0–15.0)
WBC: 5.9 10*3/uL (ref 3.8–10.8)

## 2022-04-08 LAB — TSH: TSH: 3.36 mIU/L

## 2022-04-08 LAB — HEMOGLOBIN A1C
Hgb A1c MFr Bld: 5.4 % of total Hgb (ref <5.7)
Mean Plasma Glucose: 108 mg/dL
eAG (mmol/L): 6 mmol/L

## 2022-04-08 LAB — HEPATITIS C ANTIBODY
Hepatitis C Ab: NONREACTIVE
SIGNAL TO CUT-OFF: 0.32 (ref <1.00)

## 2022-04-08 LAB — HIV ANTIBODY (ROUTINE TESTING W REFLEX): HIV 1&2 Ab, 4th Generation: NONREACTIVE

## 2022-06-16 ENCOUNTER — Ambulatory Visit: Payer: BC Managed Care – PPO

## 2022-06-19 IMAGING — US US PELVIS COMPLETE WITH TRANSVAGINAL
1 series · 14 of 25 positions shown · non-contrast
Comparison: None

CLINICAL DATA: Ovarian cyst, follow-up; LMP 10/28/2020; history
polycystic ovarian syndrome, submucosal uterine fibroid, G4P2



[Series 1: us pelvis complete with transvaginal · 14 of 111 slices shown]
[im 1/111]
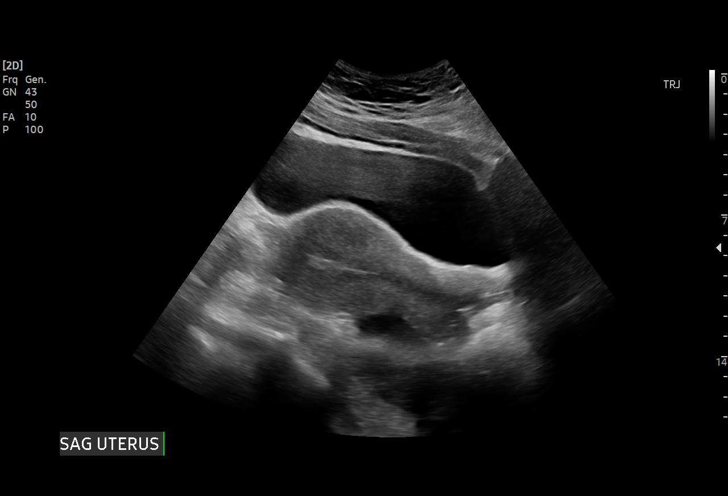
[im 10/111]
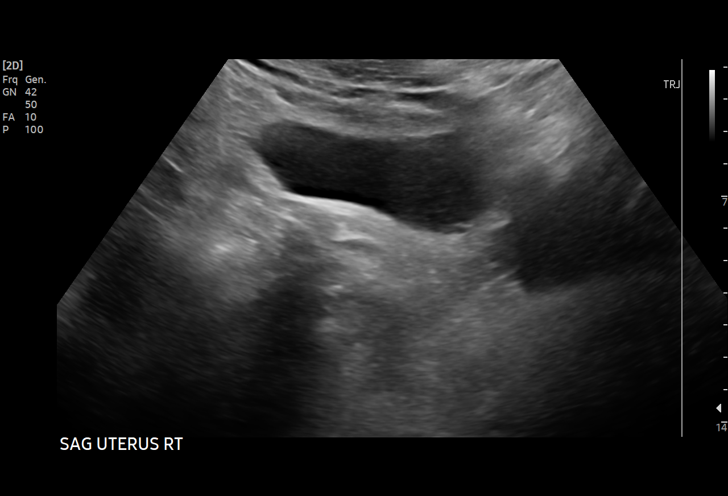
[im 19/111]
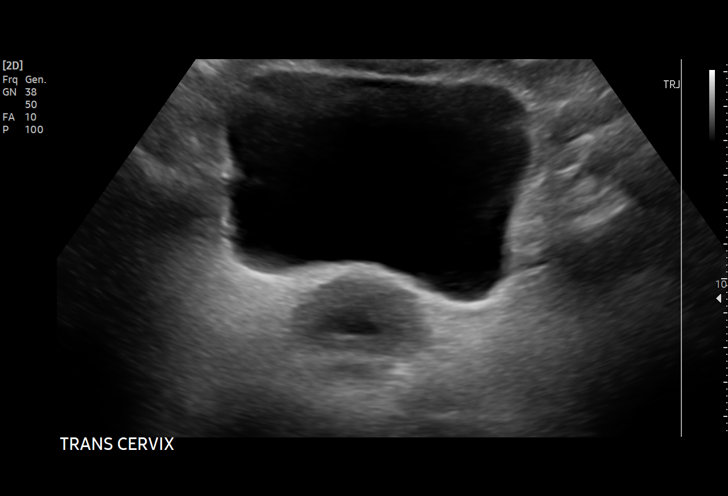
[im 28/111]
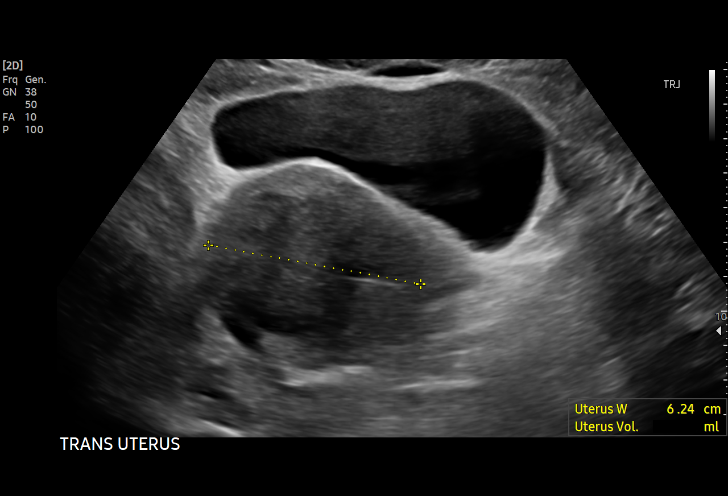
[im 37/111]
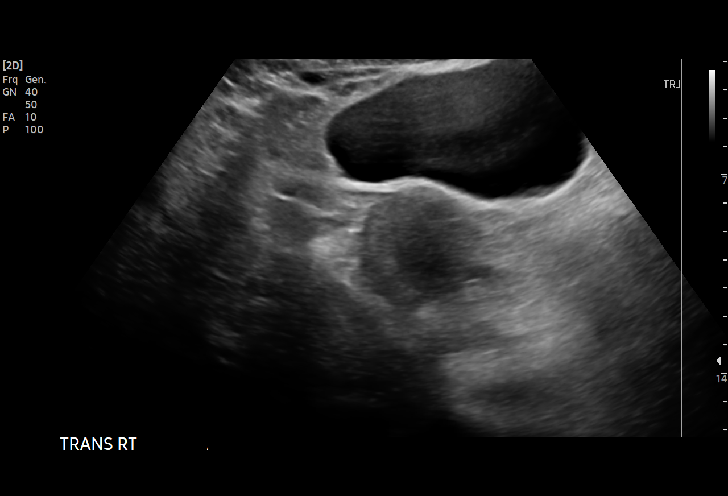
[im 42/111]
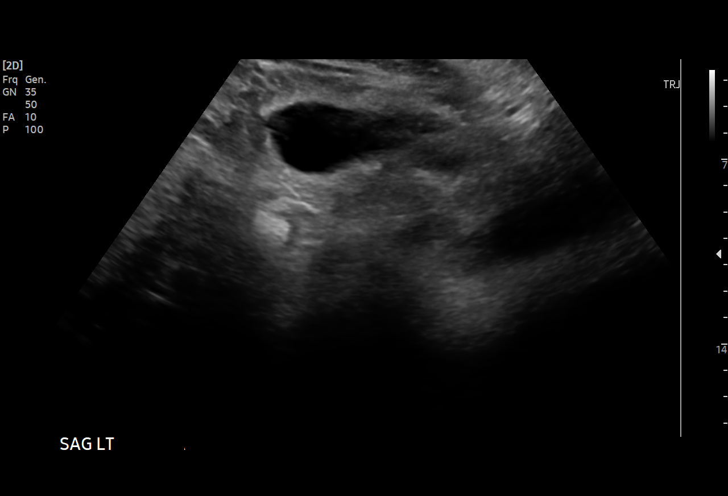
[im 51/111]
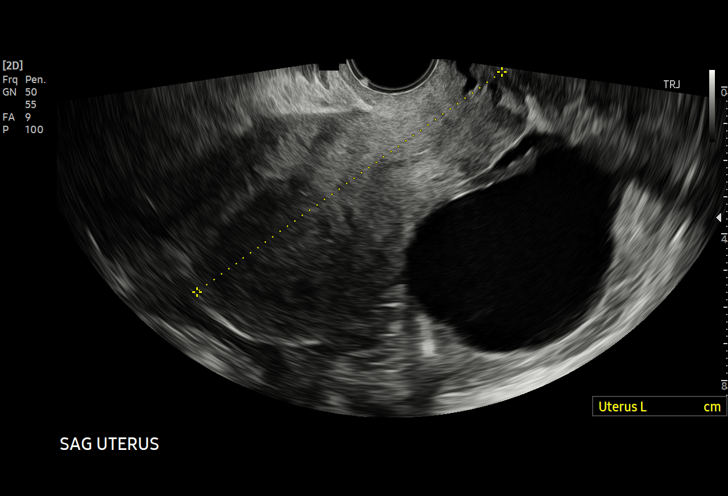
[im 60/111]
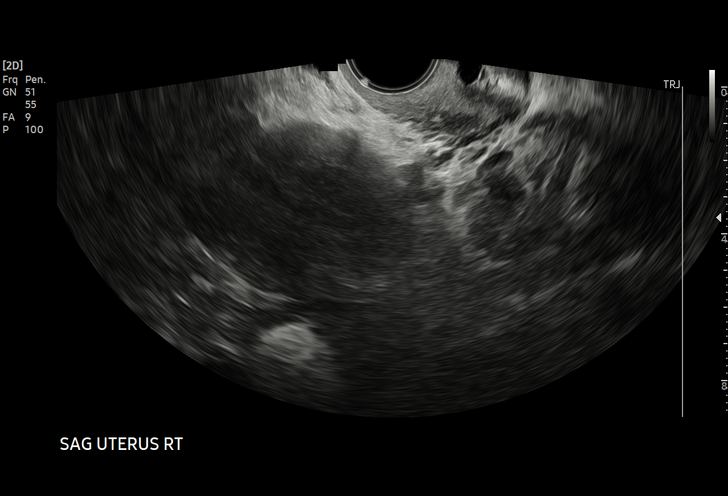
[im 69/111]
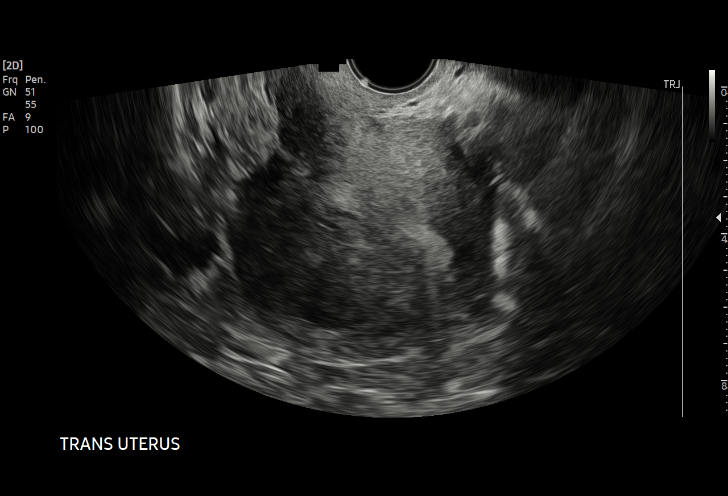
[im 74/111]
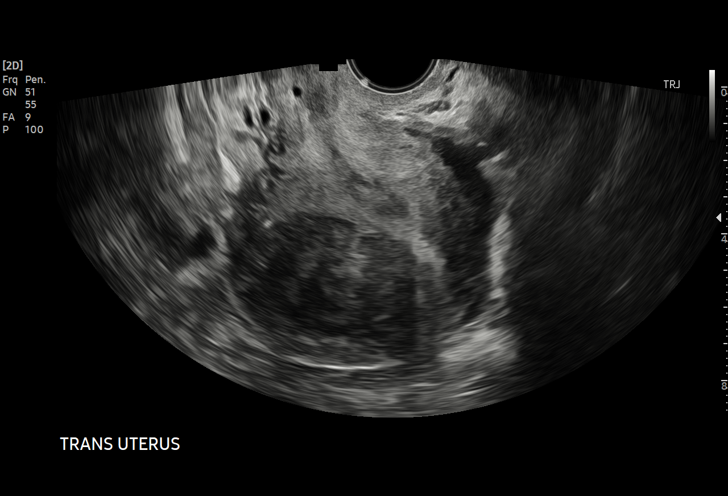
[im 83/111]
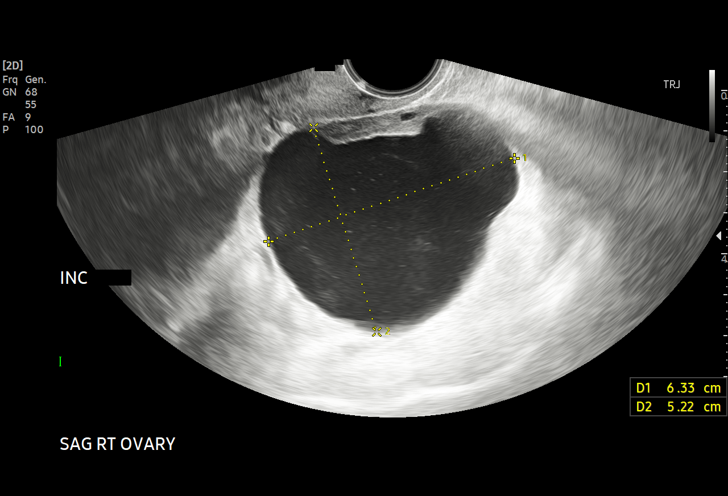
[im 92/111]
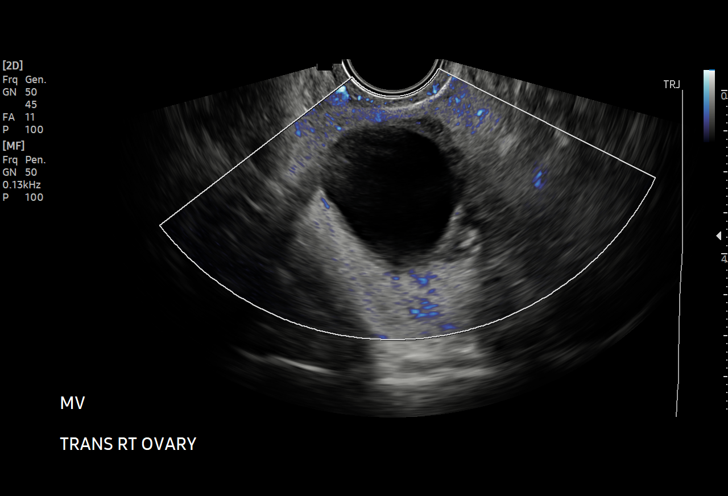
[im 101/111]
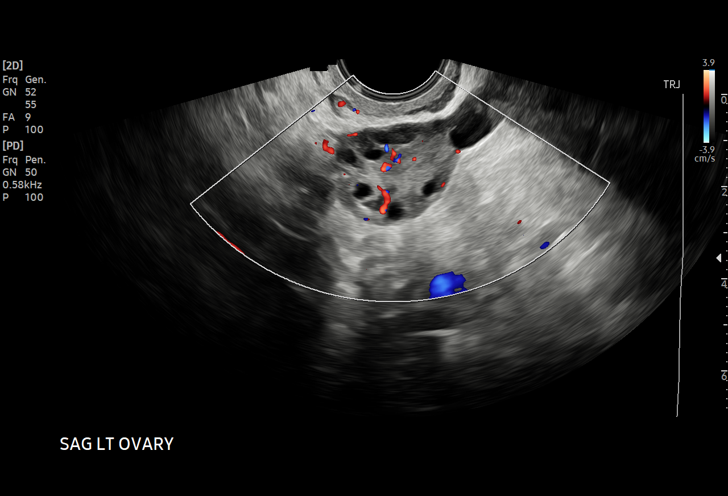
[im 111/111]
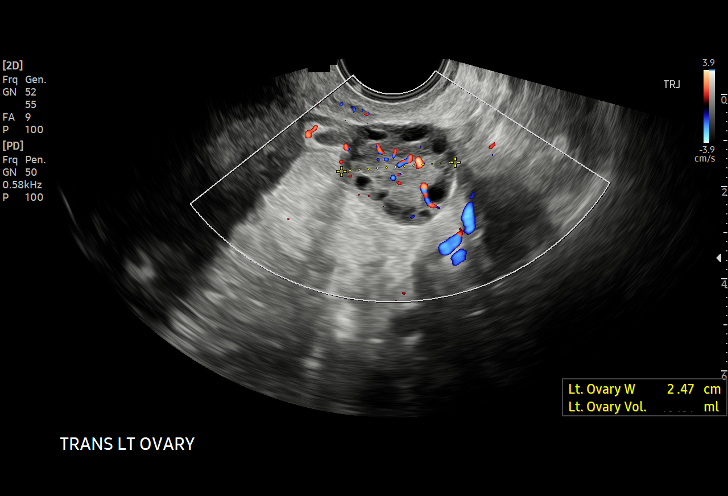

[14 of 25 positions shown; findings below may reference images not displayed]

FINDINGS: Uterus

Measurements: 10.1 x 5.2 x 6.2 cm = volume: 170 mL. Heterogeneous
myometrium. Posterior RIGHT leiomyoma at upper uterus 5.0 x 4.8 x
3.3 cm, transmural. No additional masses.

Endometrium

Thickness: 14 mm.  No endometrial fluid or focal abnormality

Right ovary

Measurements: 5.8 x 3.1 x 5.0 cm = volume: 47.3 mL. Mildly
complicated cyst within RIGHT ovary 6.3 x 5.2 x 4.8 cm, containing
scattered low level internal echogenicity, 2 thin septations
containing internal blood flow on color Doppler imaging and an area
of mural nodularity 2 cm diameter versus less likely residual normal
ovary.

Left ovary

Measurements: 3.5 x 2.4 x 2.5 cm = volume: 10.6 mL. Numerous
follicles. Small exophytic follicle 13 mm diameter; no further
imaging recommended.

Other findings

No free pelvic fluid.  No adnexal masses.
IMPRESSION: Posterior RIGHT upper uterine leiomyoma 5.0 cm greatest size.

Complicated cystic lesion of RIGHT ovary 6.3 cm diameter containing
low level internal echogenicity, 2 thin septations containing
internal blood flow on color Doppler imaging and a questionable area
of mural nodularity.

Either surgical evaluation or further characterization by MR imaging
with and without contrast recommended.

These results will be called to the ordering clinician or
representative by the Radiologist Assistant, and communication
documented in the PACS or [REDACTED].

## 2022-06-25 IMAGING — MR MR PELVIS WO/W CM
12 of 14 series · 37 of 48 positions shown · IV contrast (gadavist)
Comparison: Ultrasound on 11/07/2020

CLINICAL DATA: Right ovarian cystic mass.  Fibroids.

EXAM:
MRI PELVIS WITHOUT AND WITH CONTRAST
TECHNIQUE: Multiplanar multisequence MR imaging of the pelvis was performed
both before and after administration of intravenous contrast.
CONTRAST:  10mL GADAVIST GADOBUTROL 1 MMOL/ML IV SOLN

[Series 3: T2 · coronal · 5.0mm · 1.56mm/px · 3 of 44 slices shown (1 of 4)]
[im 1/44]
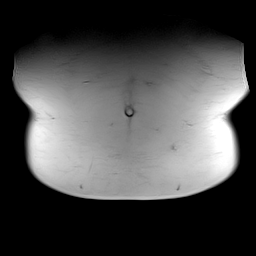
[im 22/44]
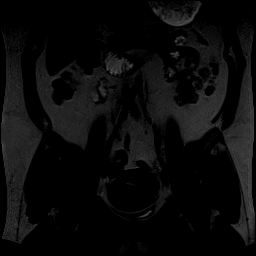
[im 44/44]
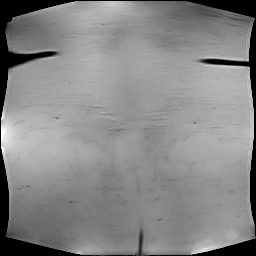

[Series 4: T2 · coronal · 5.0mm · 0.98mm/px · 2 of 29 slices shown (2 of 4)]
[im 1/29]
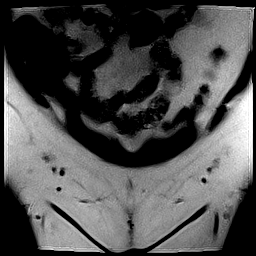
[im 29/29]
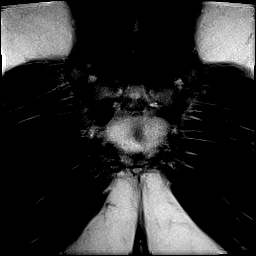

[Series 5: T2 · axial · 5.0mm · 0.44mm/px · z∈[-102,+84]mm · 2 of 32 slices shown (3 of 4)]
[im 1/32]
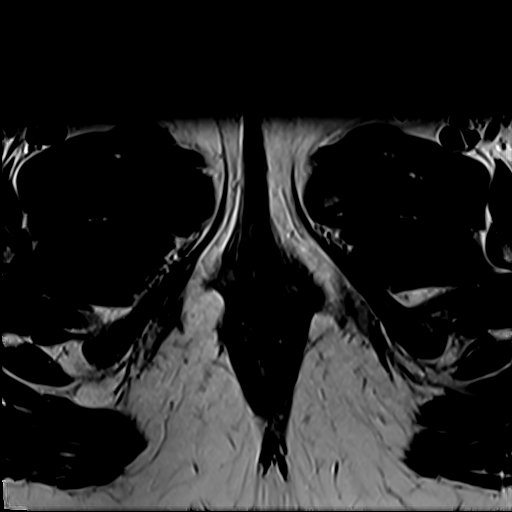
[im 32/32]
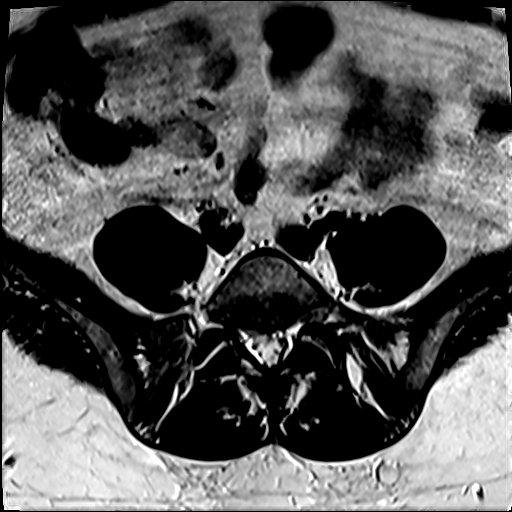

[Series 6: T2 fat-sat · axial · 5.0mm · 0.44mm/px · z∈[-102,+84]mm · 2 of 32 slices shown]
[im 1/32]
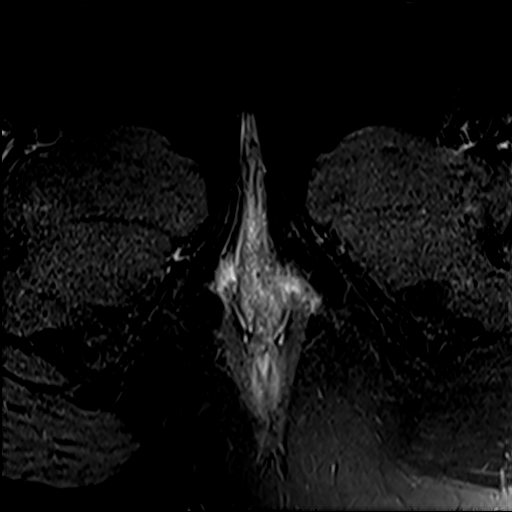
[im 32/32]
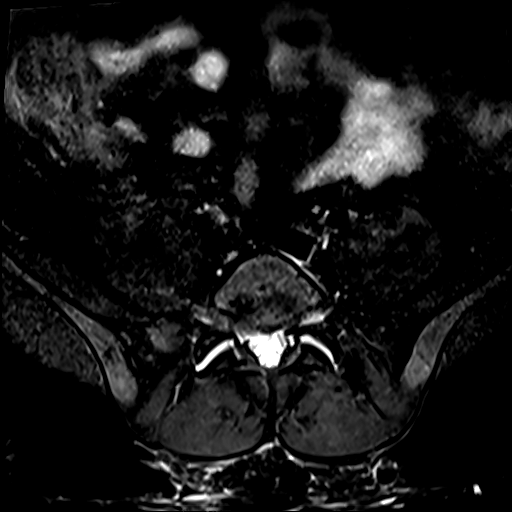

[Series 7: T2 · sagittal · 5.0mm · 0.44mm/px · 2 of 37 slices shown (4 of 4)]
[im 1/37]
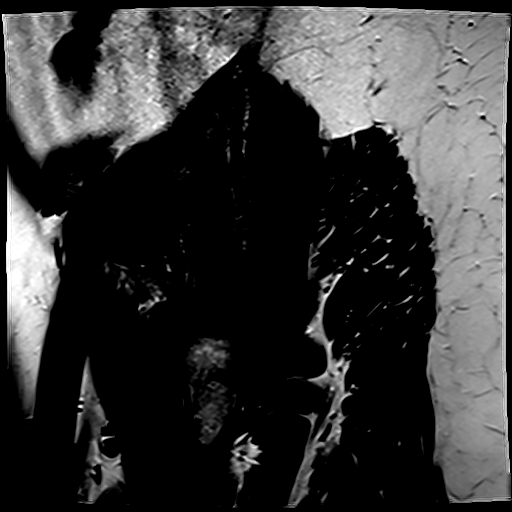
[im 37/37]
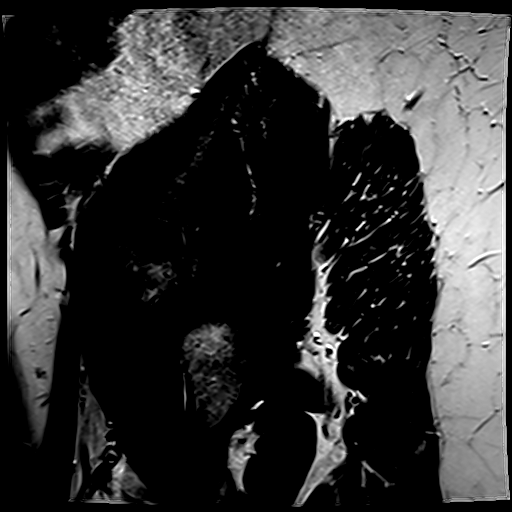

[Series 8: T1 · axial · 4.0mm · 0.84mm/px · z∈[-124,+128]mm · 4 of 64 slices shown (1 of 2)]
[im 1/64]
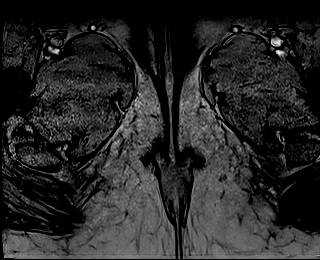
[im 22/64]
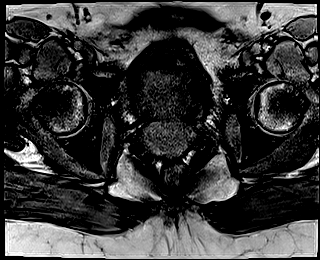
[im 43/64]
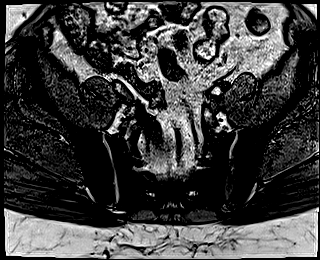
[im 64/64]
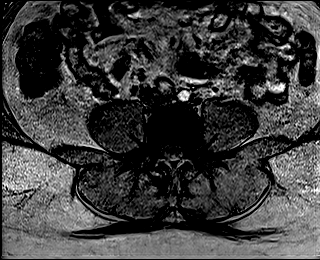

[Series 9: T1 · axial · 4.0mm · 0.84mm/px · z∈[-124,+128]mm · 4 of 64 slices shown (2 of 2)]
[im 1/64]
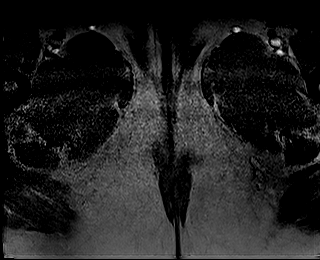
[im 22/64]
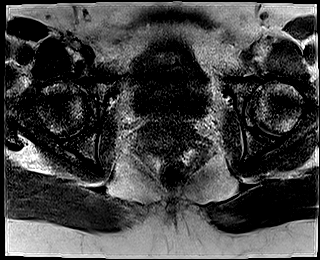
[im 43/64]
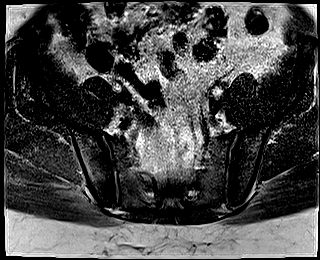
[im 64/64]
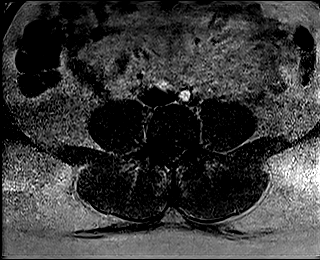

[Series 11: T1 dynamic · axial · 4.0mm · 0.84mm/px · z∈[-147,+169]mm · 5 of 80 slices shown (1 of 3)]
[im 1/80]
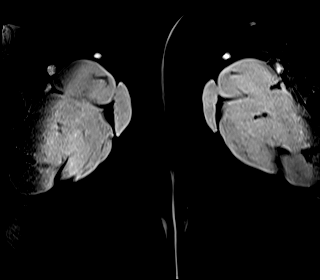
[im 20/80]
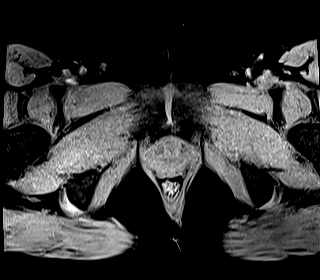
[im 40/80]
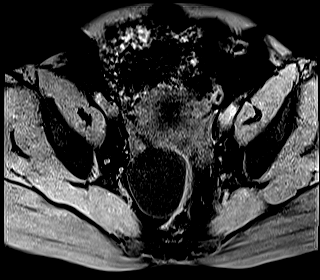
[im 60/80]
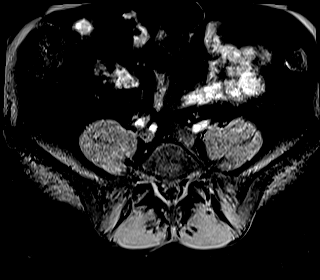
[im 80/80]
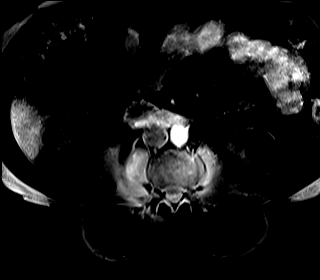

[Series 13: T1 dynamic · axial · 4.0mm · 0.84mm/px · z∈[-147,+169]mm · 5 of 80 slices shown (2 of 3)]
[im 1/80]
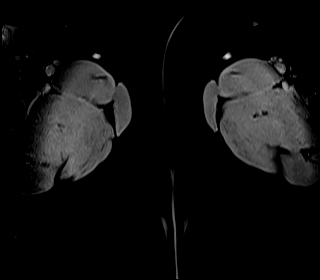
[im 20/80]
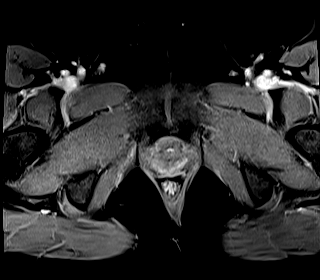
[im 40/80]
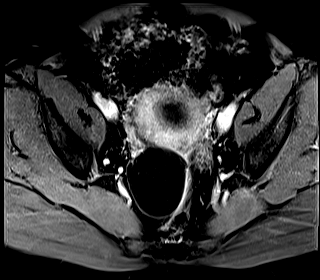
[im 60/80]
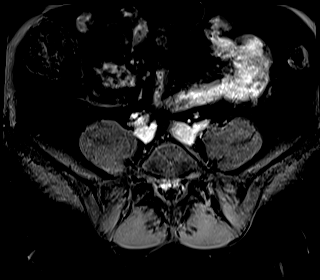
[im 80/80]
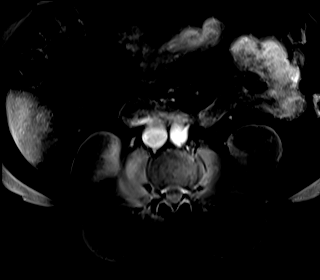

[Series 15: T1 dynamic · axial · 4.0mm · 0.84mm/px · z∈[-147,+169]mm · 5 of 80 slices shown (3 of 3)]
[im 1/80]
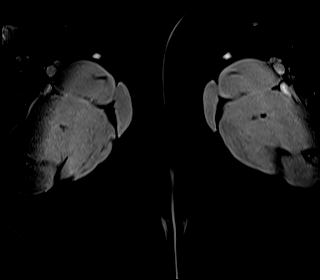
[im 20/80]
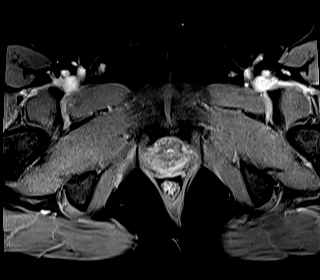
[im 40/80]
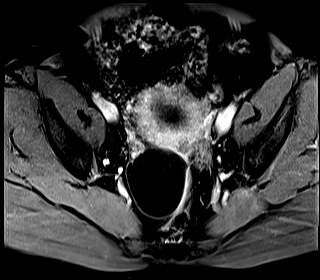
[im 60/80]
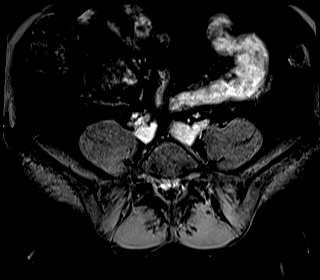
[im 80/80]
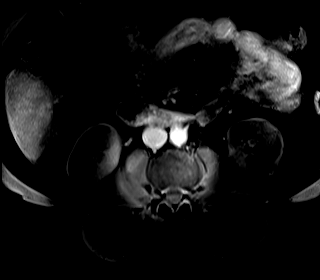

[Series 16: T1 fat-sat post-contrast · coronal · 5.0mm · 0.49mm/px · 2 of 34 slices shown]
[im 1/34]
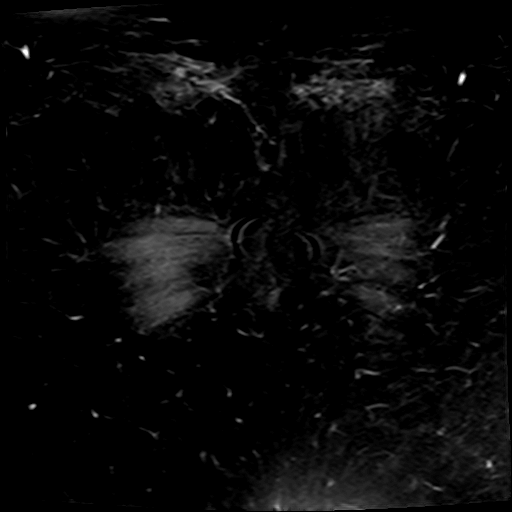
[im 34/34]
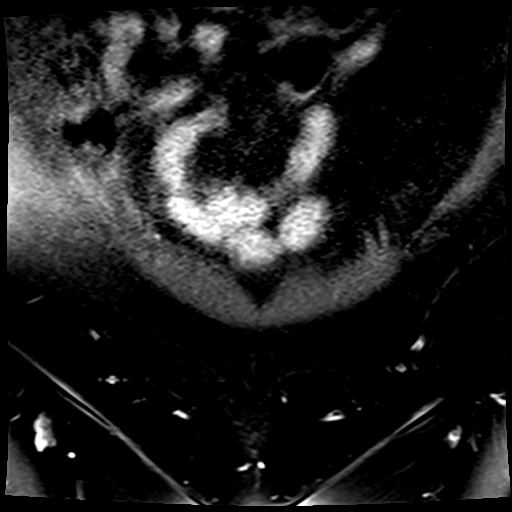

[Series 17: T2 post-contrast · sagittal · 5.0mm · 0.44mm/px · 1 of 37 slices shown]
[im 1/37]
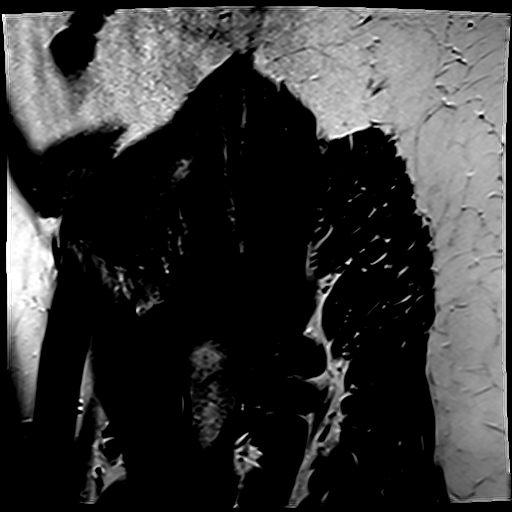

[37 of 48 positions shown; findings below may reference images not displayed]

FINDINGS: Lower Urinary Tract: No urinary bladder or urethral abnormality
identified.

Bowel: Unremarkable pelvic bowel loops.

Vascular/Lymphatic: Unremarkable. No pathologically enlarged pelvic
lymph nodes identified.

Reproductive:

-- Uterus: Measures 10.6 x 6.9 x 8.3 cm (volume = 320 cm^3).
Fibroid is seen in the right anterior uterine corpus which measures
4.5 cm in maximum diameter and has a submucosal component displacing
the endometrial cavity to the left. No other fibroids identified.
Hemorrhagic fluid noted within the endometrial cavity a. Cervix and
vagina are unremarkable.

-- Right ovary: A complex cystic lesion is seen which contains
several thin internal septations. No thickened septations or solid
nodular components identified. This lesion measures 6.7 x 5.5 by
cm.

-- Left ovary: Appears normal. No ovarian or adnexal mass
identified.

Other: No peritoneal thickening or abnormal free fluid.

Musculoskeletal:  Unremarkable.
IMPRESSION: 6.7 cm complex cystic lesion of right ovary. This has probably
benign characteristics, but is suspicious for cystic ovarian
neoplasm. Surgical evaluation should be considered.

4.5 cm uterine fibroid in the right anterior uterine corpus.

## 2022-07-02 ENCOUNTER — Encounter: Payer: Self-pay | Admitting: Internal Medicine

## 2022-07-02 ENCOUNTER — Ambulatory Visit
Admission: RE | Admit: 2022-07-02 | Discharge: 2022-07-02 | Disposition: A | Discharge status: Home / Self Care | Payer: BC Managed Care – PPO | Source: Ambulatory Visit | Attending: Internal Medicine | Admitting: Internal Medicine

## 2022-07-02 DIAGNOSIS — Z1231 Encounter for screening mammogram for malignant neoplasm of breast: Secondary | ICD-10-CM

## 2022-07-02 MED ORDER — VALACYCLOVIR HCL 1 G PO TABS: 1000.0000 mg | ORAL_TABLET | Freq: Two times a day (BID) | ORAL | 0 refills | Status: DC

## 2022-07-03 MED ORDER — SEMAGLUTIDE-WEIGHT MANAGEMENT 0.25 MG/0.5ML ~~LOC~~ SOAJ: 0.2500 mg | SUBCUTANEOUS | 0 refills | Status: DC

## 2022-07-03 NOTE — Addendum Note (Signed)
Addended by: Jearld Fenton on: 07/03/2022 12:12 PM   Modules accepted: Orders

## 2022-07-17 ENCOUNTER — Other Ambulatory Visit: Payer: Self-pay | Admitting: Rehabilitation

## 2022-07-17 DIAGNOSIS — M5412 Radiculopathy, cervical region: Secondary | ICD-10-CM

## 2022-08-02 ENCOUNTER — Other Ambulatory Visit: Payer: Self-pay | Admitting: Internal Medicine

## 2022-08-04 NOTE — Telephone Encounter (Signed)
Requested Prescriptions  Pending Prescriptions Disp Refills  . buPROPion (WELLBUTRIN XL) 300 MG 24 hr tablet [Pharmacy Med Name: BUPROPION HCL XL 300 MG TABLET] 90 tablet     Sig: TAKE 1 TABLET BY MOUTH EVERY DAY     Psychiatry: Antidepressants - bupropion Failed - 08/02/2022  1:18 AM      Failed - Cr in normal range and within 360 days    Creat  Date Value Ref Range Status  04/07/2022 1.11 (H) 0.50 - 0.99 mg/dL Final         Passed - AST in normal range and within 360 days    AST  Date Value Ref Range Status  04/07/2022 16 10 - 30 U/L Final         Passed - ALT in normal range and within 360 days    ALT  Date Value Ref Range Status  04/07/2022 17 6 - 29 U/L Final         Passed - Completed PHQ-2 or PHQ-9 in the last 360 days      Passed - Last BP in normal range    BP Readings from Last 1 Encounters:  04/07/22 136/84         Passed - Valid encounter within last 6 months    Recent Outpatient Visits          3 months ago Encounter for general adult medical examination with abnormal findings   [facility], Coralie Keens, NP   1 year ago Primary hypertension   [facility] [place], Coralie Keens, NP   1 year ago [facility] [place], Coralie Keens, [place]

## 2022-08-08 ENCOUNTER — Other Ambulatory Visit: Payer: BC Managed Care – PPO

## 2022-09-12 ENCOUNTER — Ambulatory Visit
Admission: RE | Admit: 2022-09-12 | Discharge: 2022-09-12 | Disposition: A | Discharge status: Home / Self Care | Payer: BC Managed Care – PPO | Source: Ambulatory Visit | Attending: Rehabilitation | Admitting: Rehabilitation

## 2022-09-12 DIAGNOSIS — M5412 Radiculopathy, cervical region: Secondary | ICD-10-CM

## 2022-09-12 MED ORDER — GADOPICLENOL 0.5 MMOL/ML IV SOLN
10.0000 mL | Freq: Once | INTRAVENOUS | Status: AC | PRN
  Administered 2022-09-12: 10 mL via INTRAVENOUS

## 2023-01-29 ENCOUNTER — Other Ambulatory Visit (HOSPITAL_COMMUNITY): Payer: Self-pay

## 2023-01-29 MED ORDER — LISDEXAMFETAMINE DIMESYLATE 30 MG PO CAPS
30.0000 mg | ORAL_CAPSULE | Freq: Every morning | ORAL | 0 refills | Status: DC
  Filled 2023-01-29 – 2023-02-26 (×2): qty 30, 30d supply, fill #0

## 2023-01-29 MED ORDER — LISDEXAMFETAMINE DIMESYLATE 20 MG PO CAPS
20.0000 mg | ORAL_CAPSULE | ORAL | 0 refills | Status: DC
  Filled 2023-01-29: qty 30, 30d supply, fill #0

## 2023-02-01 ENCOUNTER — Other Ambulatory Visit: Payer: Self-pay | Admitting: Orthopedic Surgery

## 2023-02-01 DIAGNOSIS — M47816 Spondylosis without myelopathy or radiculopathy, lumbar region: Secondary | ICD-10-CM

## 2023-02-01 DIAGNOSIS — M5416 Radiculopathy, lumbar region: Secondary | ICD-10-CM

## 2023-02-02 ENCOUNTER — Encounter: Payer: Self-pay | Admitting: Orthopedic Surgery

## 2023-02-05 ENCOUNTER — Other Ambulatory Visit (HOSPITAL_COMMUNITY): Payer: Self-pay

## 2023-02-10 ENCOUNTER — Other Ambulatory Visit: Payer: Self-pay | Admitting: Internal Medicine

## 2023-02-10 ENCOUNTER — Encounter: Payer: Self-pay | Admitting: Internal Medicine

## 2023-02-12 ENCOUNTER — Other Ambulatory Visit: Payer: BC Managed Care – PPO

## 2023-02-20 ENCOUNTER — Ambulatory Visit
Admission: RE | Admit: 2023-02-20 | Discharge: 2023-02-20 | Disposition: A | Discharge status: Home / Self Care | Payer: BC Managed Care – PPO | Source: Ambulatory Visit | Attending: Orthopedic Surgery | Admitting: Orthopedic Surgery

## 2023-02-20 DIAGNOSIS — M5416 Radiculopathy, lumbar region: Secondary | ICD-10-CM

## 2023-02-20 DIAGNOSIS — M47816 Spondylosis without myelopathy or radiculopathy, lumbar region: Secondary | ICD-10-CM

## 2023-02-23 ENCOUNTER — Other Ambulatory Visit (HOSPITAL_COMMUNITY): Payer: Self-pay

## 2023-02-26 ENCOUNTER — Other Ambulatory Visit (HOSPITAL_COMMUNITY): Payer: Self-pay

## 2023-03-01 ENCOUNTER — Other Ambulatory Visit: Payer: BC Managed Care – PPO

## 2023-03-02 ENCOUNTER — Other Ambulatory Visit (HOSPITAL_COMMUNITY): Payer: Self-pay

## 2023-03-03 ENCOUNTER — Other Ambulatory Visit (HOSPITAL_COMMUNITY): Payer: Self-pay

## 2023-03-03 MED ORDER — LISDEXAMFETAMINE DIMESYLATE 20 MG PO CAPS
20.0000 mg | ORAL_CAPSULE | Freq: Every day | ORAL | 0 refills | Status: DC
  Filled 2023-03-03 – 2023-03-12 (×2): qty 30, 30d supply, fill #0

## 2023-03-11 ENCOUNTER — Other Ambulatory Visit (HOSPITAL_COMMUNITY): Payer: Self-pay

## 2023-03-11 ENCOUNTER — Other Ambulatory Visit: Payer: Self-pay

## 2023-03-12 ENCOUNTER — Other Ambulatory Visit (HOSPITAL_COMMUNITY): Payer: Self-pay

## 2023-03-13 ENCOUNTER — Other Ambulatory Visit (HOSPITAL_COMMUNITY): Payer: Self-pay

## 2023-03-23 ENCOUNTER — Encounter: Payer: Self-pay | Admitting: Internal Medicine

## 2023-03-25 ENCOUNTER — Telehealth: Payer: BC Managed Care – PPO | Admitting: Internal Medicine

## 2023-03-25 ENCOUNTER — Ambulatory Visit: Payer: BC Managed Care – PPO | Admitting: Internal Medicine

## 2023-03-25 ENCOUNTER — Encounter: Payer: Self-pay | Admitting: Internal Medicine

## 2023-03-25 VITALS — BP 136/82 | HR 72 | Temp 96.5°F | Wt 302.0 lb

## 2023-03-25 DIAGNOSIS — M5442 Lumbago with sciatica, left side: Secondary | ICD-10-CM | POA: Diagnosis not present

## 2023-03-25 DIAGNOSIS — E66813 Obesity, class 3: Secondary | ICD-10-CM

## 2023-03-25 DIAGNOSIS — Z01818 Encounter for other preprocedural examination: Secondary | ICD-10-CM | POA: Diagnosis not present

## 2023-03-25 DIAGNOSIS — R739 Hyperglycemia, unspecified: Secondary | ICD-10-CM

## 2023-03-25 DIAGNOSIS — R7309 Other abnormal glucose: Secondary | ICD-10-CM | POA: Diagnosis not present

## 2023-03-25 DIAGNOSIS — Z6841 Body Mass Index (BMI) 40.0 and over, adult: Secondary | ICD-10-CM

## 2023-03-25 DIAGNOSIS — E662 Morbid (severe) obesity with alveolar hypoventilation: Secondary | ICD-10-CM

## 2023-03-25 DIAGNOSIS — E7800 Pure hypercholesterolemia, unspecified: Secondary | ICD-10-CM

## 2023-03-25 DIAGNOSIS — L308 Other specified dermatitis: Secondary | ICD-10-CM

## 2023-03-25 DIAGNOSIS — M5441 Lumbago with sciatica, right side: Secondary | ICD-10-CM

## 2023-03-25 DIAGNOSIS — G8929 Other chronic pain: Secondary | ICD-10-CM

## 2023-03-25 MED ORDER — TRIAMCINOLONE ACETONIDE 0.1 % EX CREA: 1.0000 | TOPICAL_CREAM | Freq: Two times a day (BID) | CUTANEOUS | 0 refills | Status: DC

## 2023-03-25 NOTE — Patient Instructions (Signed)

## 2023-03-25 NOTE — Assessment & Plan Note (Signed)
Encouraged diet and exercise for weight loss as this can help reduce back pain °

## 2023-03-25 NOTE — Progress Notes (Signed)
Subjective:    Patient ID: Maria Bright, female    DOB: 09-21-1976, 47 y.o.   MRN: [id number]  HPI  Patient presents to clinic today for surgical clearance.  She will be having a L4-S1 fusion with TLIF by Dr. Precious Gilding.  MRI lumbar spine from 01/2023 showed: IMPRESSION: 1. L5-S1 severe left and moderate right neural foraminal narrowing. The right aspect of the disc at L5-S1 likely contacts the exiting right L5 nerve. 2. L4-L5 mild bilateral neural foraminal narrowing.  She describes the pain as dull and achy but can be sharp and shooting, radiating down to her left leg.  She denies numbness, tingling, weakness of her left lower extremity.  She denies loss of bowel or bladder control.  She does have a remote history of DVT.  She does follow with vascular.  She also reports a rash to the top of her right foot.  She reports she noticed this 1 year ago.  She has tried hydrocortisone and antifungal cream OTC with minimal relief of symptoms.  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   acute deep vein thrombosis involving the right posterior tibial veins, and right peroneal veins   Dyspnea    due to allergies, asthma   Fibromyalgia    GERD (gastroesophageal reflux disease)    Heart palpitations    has not been seen for it   History of peripheral neuropathy    Hypertension    never has been treated with medications   Migraine    Ovarian cyst    Polycystic disease, ovaries    POC    Seasonal allergies    Sleep apnea    CPAP    Uterine fibroid     Current Outpatient Medications  Medication Sig Dispense Refill   acetaminophen  (TYLENOL) 500 MG tablet Take 1,000 mg by mouth every 6 (six) hours as needed for mild pain.     albuterol (VENTOLIN HFA) 108 (90 Base) MCG/ACT inhaler Inhale 2 puffs into the lungs every 6 (six) hours as needed for wheezing or shortness of breath. 8 g 5   buPROPion (WELLBUTRIN XL) 300 MG 24 hr tablet TAKE 1 TABLET BY MOUTH EVERY DAY 90 tablet 0   cetirizine (ZYRTEC) 10 MG tablet Take 10 mg by mouth at bedtime.     diphenhydrAMINE (BENADRYL) 25 MG tablet Take 50 mg by mouth every 6 (six) hours as needed for itching or allergies.     fexofenadine (ALLEGRA) 180 MG tablet Take 180 mg by mouth daily.     fluticasone (FLONASE) 50 MCG/ACT nasal spray Place 2 sprays into both nostrils daily. 16 g 6   fluticasone (FLOVENT HFA) 44 MCG/ACT inhaler Flovent HFA 44 mcg/actuation aerosol inhaler  INHALE 2 PUFFS INTO THE LUNGS TWICE A DAY FOR 30 DAYS     hydrocortisone cream 1 % Apply 1 application topically 2 (two) times daily as needed for itching.     ibuprofen (ADVIL) 800 MG tablet Take 1 tablet (800 mg total) by mouth every 8 (eight) hours as needed for moderate pain (  pain). For AFTER surgery only 30 tablet 1   lisdexamfetamine (VYVANSE) 20 MG capsule Take 1 capsule (20 mg total) by mouth 2 (two) times daily. 60 capsule 0   lisdexamfetamine (VYVANSE) 20 MG capsule Take 1 capsule (20 mg total) by mouth once daily mid-day. 30 capsule 0   lisdexamfetamine (VYVANSE) 30 MG capsule Take 1 capsule (30 mg total) by mouth every morning. 30 capsule 0   nystatin (MYCOSTATIN/NYSTOP) powder APPLY 1 APPLICATION TOPICALLY 3 (THREE) TIMES DAILY. 60 g 5   omeprazole (PRILOSEC OTC) 20 MG tablet Take 20 mg by mouth daily as needed (heartburn).     pregabalin (LYRICA) 150 MG capsule Take 150 mg by mouth 2 (two) times daily.     propranolol (INDERAL) 10 MG tablet Take 1 tablet (10 mg total) by mouth daily as needed (anxiety). Anxiety 90 tablet 1   Semaglutide-Weight Management 0.25 MG/0.5ML SOAJ Inject 0.25 mg into the skin once  a week. 2 mL 0   sertraline (ZOLOFT) 100 MG tablet Take 100 mg by mouth daily.     valACYclovir (VALTREX) 1000 MG tablet Take 1 tablet (1,000 mg total) by mouth 2 (two) times daily. 14 tablet 0   No current facility-administered medications for this visit.    Allergies  Allergen Reactions   Lactose Intolerance (Gi)    Latex Hives   Morphine And Related Nausea And Vomiting        Other     Tomatoes, pineapple, oranges, grapefruit, lemons, limes, citrus fruits, and all nightshade vegetables    Adhesive [Tape] Rash    blisters   Nickel Rash    Family History  Problem Relation Age of Onset   Arthritis Mother    Hyperlipidemia Mother    Heart disease Mother    Hypertension Mother    Rheum arthritis Maternal Aunt    Hyperlipidemia Paternal Aunt    Diabetes Paternal Aunt    Rheum arthritis Maternal Grandmother    Heart disease Maternal Grandmother    Arthritis Maternal Grandfather    Hyperlipidemia Maternal Grandfather    Heart disease Maternal Grandfather    Hypertension Maternal Grandfather    Prostate cancer Paternal Uncle    Colon cancer Neg Hx    Breast cancer Neg Hx    Pancreatic cancer Neg Hx    Endometrial cancer Neg Hx    Ovarian cancer Neg Hx     Social History   Socioeconomic History   Marital status: Married    Spouse name: Not on file   Number of children: Not on file   Years of education: Not on file   Highest education level: Not on file  Occupational History   Not on file  Tobacco Use   Smoking status: Never   Smokeless tobacco: Never  Vaping Use   Vaping Use: Never used  Substance and Sexual Activity   Alcohol use: No    Comment: seldom    Drug use: No   Sexual activity: Yes    Birth control/protection: None, Surgical    Comment: hUSBAND VASECTOMY  Other Topics Concern   Not on file  Social History Narrative   Not on file   Social Determinants of Health   Financial Resource Strain: Not on file  Food Insecurity: Not on file   Transportation Needs: Not on file  Physical Activity: Not on file  Stress: Not on file  Social Connections: Not on file  Intimate Partner Violence: Not on file     Constitutional: Denies fever, malaise,  fatigue, headache or abrupt weight changes.  Respiratory: Denies difficulty breathing, shortness of breath, cough or sputum production.   Cardiovascular: Denies chest pain, chest tightness, palpitations or swelling in the hands or feet.  Gastrointestinal: Denies abdominal pain, bloating, constipation, diarrhea or blood in the stool.  GU: Denies urgency, frequency, pain with urination, burning sensation, blood in urine, odor or discharge. Musculoskeletal: Patient reports chronic low back pain.  Denies decrease in range of motion, difficulty with gait, muscle pain or joint swelling.  Skin: Patient reports rash to the top of her right foot.  Denies redness, lesions or ulcercations.  Neurological: Patient reports inattention.  Denies dizziness, difficulty with memory, difficulty with speech or problems with balance and coordination.  Psych: Patient has a history of anxiety and depression.  Denies SI/HI.  No other specific complaints in a complete review of systems (except as listed in HPI above).  Objective:   Physical Exam   BP 136/82 (BP Location: Left Arm, Patient Position: Sitting, Cuff Size: Large)   Pulse 72   Temp (!) 96.5 F (35.8 C) (Temporal)   Wt (!) 302 lb (137 kg)   SpO2 98%   BMI 48.74 kg/m   Wt Readings from Last 3 Encounters:  04/07/22 (!) 310 lb (140.6 kg)  06/25/21 (!) 312 lb 6.4 oz (141.7 kg)  06/13/21 (!) 308 lb 9.6 oz (140 kg)    General: Appears her stated age, obese, in NAD. Skin: 2 cm round, scaly rash noted on the dorsal aspect of the mid right foot. HEENT: Head: normal shape and size; Eyes: sclera white, no icterus, conjunctiva pink, PERRLA and EOMs intact;  Cardiovascular: Normal rate and rhythm. S1,S2 noted.  No murmur, rubs or gallops noted. No JVD  or BLE edema.  Pulmonary/Chest: Normal effort and positive vesicular breath sounds. No respiratory distress. No wheezes, rales or ronchi noted.  Musculoskeletal: Normal flexion, extension and rotation of the spine.  Bony tenderness noted over the lumbar spine.  Strength 5/5 BLE.  No difficulty with gait.  Neurological: Alert and oriented. Coordination normal.    BMET    Component Value Date/Time   NA 139 04/07/2022 0845   K 3.9 04/07/2022 0845   CL 104 04/07/2022 0845   CO2 26 04/07/2022 0845   GLUCOSE 95 04/07/2022 0845   BUN 11 04/07/2022 0845   CREATININE 1.11 (H) 04/07/2022 0845   CALCIUM 9.0 04/07/2022 0845   GFRNONAA >60 05/15/2021 0945   GFRAA >60 06/26/2020 1701    Lipid Panel     Component Value Date/Time   CHOL 202 (H) 04/07/2022 0845   TRIG 75 04/07/2022 0845   HDL 67 04/07/2022 0845   CHOLHDL 3.0 04/07/2022 0845   VLDL 19.2 02/15/2020 1514   LDLCALC 118 (H) 04/07/2022 0845    CBC    Component Value Date/Time   WBC 5.9 04/07/2022 0845   RBC 4.91 04/07/2022 0845   HGB 14.2 04/07/2022 0845   HCT 44.1 04/07/2022 0845   PLT 249 04/07/2022 0845   MCV 89.8 04/07/2022 0845   MCH 28.9 04/07/2022 0845   MCHC 32.2 04/07/2022 0845   RDW 13.1 04/07/2022 0845   LYMPHSABS 2.2 06/26/2020 1701   MONOABS 1.1 (H) 06/26/2020 1701   EOSABS 0.3 06/26/2020 1701   BASOSABS 0.1 06/26/2020 1701    Hgb A1C Lab Results  Component Value Date   HGBA1C 5.4 04/07/2022           Assessment & Plan:   Preoperative Clearance for L5-S1 Lumbar Fusion  secondary to Chronic Low Back Pain:  Indication for ECG: Preoperative clearance Interpretation of ECG: Normal rate and rhythm, no acute findings Comparison of ECG: 05/2020, unchanged We will check CBC, c-Met, lipid, A1c  Eczema:  Rx from Triamcinolone cream twice daily as needed  RTC in 1 month for annual exam Nicki Reaper, NP

## 2023-03-26 LAB — CBC
HCT: 42.2 % (ref 35.0–45.0)
Hemoglobin: 13.7 g/dL (ref 11.7–15.5)
MCH: 28.7 pg (ref 27.0–33.0)
MCHC: 32.5 g/dL (ref 32.0–36.0)
MCV: 88.5 fL (ref 80.0–100.0)
MPV: 10.8 fL (ref 7.5–12.5)
Platelets: 202 10*3/uL (ref 140–400)
RBC: 4.77 10*6/uL (ref 3.80–5.10)
RDW: 12.9 % (ref 11.0–15.0)
WBC: 4.7 10*3/uL (ref 3.8–10.8)

## 2023-03-26 LAB — COMPLETE METABOLIC PANEL WITH GFR
AG Ratio: 1.7 (calc) (ref 1.0–2.5)
ALT: 16 U/L (ref 6–29)
AST: 15 U/L (ref 10–30)
Albumin: 4 g/dL (ref 3.6–5.1)
Alkaline phosphatase (APISO): 78 U/L (ref 31–125)
BUN: 12 mg/dL (ref 7–25)
CO2: 26 mmol/L (ref 20–32)
Calcium: 9 mg/dL (ref 8.6–10.2)
Chloride: 104 mmol/L (ref 98–110)
Creat: 0.96 mg/dL (ref 0.50–0.99)
Globulin: 2.4 g/dL (calc) (ref 1.9–3.7)
Glucose, Bld: 84 mg/dL (ref 65–99)
Potassium: 4.2 mmol/L (ref 3.5–5.3)
Sodium: 139 mmol/L (ref 135–146)
Total Bilirubin: 0.3 mg/dL (ref 0.2–1.2)
Total Protein: 6.4 g/dL (ref 6.1–8.1)
eGFR: 75 mL/min/{1.73_m2} (ref >=60)

## 2023-03-26 LAB — LIPID PANEL
Cholesterol: 210 mg/dL — ABNORMAL HIGH (ref <200)
HDL: 64 mg/dL (ref >=50)
LDL Cholesterol (Calc): 124 mg/dL (calc) — ABNORMAL HIGH
Non-HDL Cholesterol (Calc): 146 mg/dL (calc) — ABNORMAL HIGH (ref <130)
Total CHOL/HDL Ratio: 3.3 (calc) (ref <5.0)
Triglycerides: 114 mg/dL (ref <150)

## 2023-03-26 LAB — HEMOGLOBIN A1C
Hgb A1c MFr Bld: 5.7 % of total Hgb — ABNORMAL HIGH (ref <5.7)
Mean Plasma Glucose: 117 mg/dL
eAG (mmol/L): 6.5 mmol/L

## 2023-03-28 ENCOUNTER — Encounter: Payer: Self-pay | Admitting: Internal Medicine

## 2023-03-29 MED ORDER — PRAVASTATIN SODIUM 10 MG PO TABS: 10.0000 mg | ORAL_TABLET | Freq: Every day | ORAL | 1 refills | Status: DC

## 2023-04-09 ENCOUNTER — Telehealth: Payer: Self-pay | Admitting: Internal Medicine

## 2023-04-09 NOTE — Telephone Encounter (Signed)
Maria S. With [facility] states that they are needing the pt surgery clearance faxed over to them for surgery.   Fax: [phone] Attn: Dalphine Handing

## 2023-04-10 ENCOUNTER — Encounter: Payer: Self-pay | Admitting: Internal Medicine

## 2023-04-12 NOTE — Telephone Encounter (Signed)
Disregard previous message.  I reprinted the one that was on file and will get this filled out and faxed back

## 2023-04-12 NOTE — Telephone Encounter (Signed)
She was seen 4/25 for this.  This should have been faxed and scanned already but I do not see it under the media tab.  They may need to send me a new form.

## 2023-04-12 NOTE — Telephone Encounter (Signed)
Have we contacted their office for an new form sent as the one I did is not under the media tab?  It may have been sent to scan but not scanned in yet

## 2023-04-15 ENCOUNTER — Other Ambulatory Visit: Payer: Self-pay

## 2023-04-15 ENCOUNTER — Telehealth: Payer: Self-pay | Admitting: Internal Medicine

## 2023-04-15 ENCOUNTER — Other Ambulatory Visit (HOSPITAL_COMMUNITY): Payer: Self-pay

## 2023-04-15 MED ORDER — LISDEXAMFETAMINE DIMESYLATE 30 MG PO CAPS
30.0000 mg | ORAL_CAPSULE | Freq: Every morning | ORAL | 0 refills | Status: DC
  Filled 2023-04-15: qty 30, 30d supply, fill #0

## 2023-04-15 NOTE — Telephone Encounter (Signed)
Pt is calling to follow up to see if surgical clearance paperwork was faxed? Please advise CB- [phone]

## 2023-04-19 ENCOUNTER — Other Ambulatory Visit (HOSPITAL_COMMUNITY): Payer: Self-pay

## 2023-04-21 ENCOUNTER — Other Ambulatory Visit (HOSPITAL_COMMUNITY): Payer: Self-pay

## 2023-04-21 MED ORDER — LISDEXAMFETAMINE DIMESYLATE 20 MG PO CAPS: 20.0000 mg | ORAL_CAPSULE | Freq: Every day | ORAL | 0 refills | Status: DC

## 2023-04-21 MED ORDER — LISDEXAMFETAMINE DIMESYLATE 30 MG PO CAPS
30.0000 mg | ORAL_CAPSULE | Freq: Every morning | ORAL | 0 refills | Status: DC
  Filled 2023-05-21: qty 30, 30d supply, fill #0

## 2023-04-27 ENCOUNTER — Encounter: Payer: Self-pay | Admitting: Internal Medicine

## 2023-05-21 ENCOUNTER — Other Ambulatory Visit (HOSPITAL_COMMUNITY): Payer: Self-pay

## 2023-05-21 ENCOUNTER — Other Ambulatory Visit: Payer: Self-pay

## 2023-06-01 ENCOUNTER — Other Ambulatory Visit (HOSPITAL_COMMUNITY): Payer: Self-pay

## 2023-06-02 ENCOUNTER — Other Ambulatory Visit (HOSPITAL_COMMUNITY): Payer: Self-pay

## 2023-06-02 MED ORDER — LISDEXAMFETAMINE DIMESYLATE 20 MG PO CAPS
20.0000 mg | ORAL_CAPSULE | Freq: Every morning | ORAL | 0 refills | Status: DC
  Filled 2023-06-02 – 2023-07-14 (×2): qty 30, 30d supply, fill #0

## 2023-06-02 MED ORDER — LISDEXAMFETAMINE DIMESYLATE 30 MG PO CAPS
30.0000 mg | ORAL_CAPSULE | Freq: Every morning | ORAL | 0 refills | Status: DC
  Filled 2023-07-14: qty 30, 30d supply, fill #0

## 2023-06-10 ENCOUNTER — Other Ambulatory Visit (HOSPITAL_COMMUNITY): Payer: Self-pay

## 2023-06-11 ENCOUNTER — Other Ambulatory Visit: Payer: Self-pay | Admitting: Internal Medicine

## 2023-06-11 NOTE — Telephone Encounter (Signed)
Requested medication (s) are due for refill today:yes  Requested medication (s) are on the active medication list: yes  Last refill:  01/27/22 60 g 5 RF   Future visit scheduled: no  Notes to clinic:  medication not assigned to a protocol   Requested Prescriptions  Pending Prescriptions Disp Refills   nystatin (MYCOSTATIN/NYSTOP) powder [Pharmacy Med Name: NYSTATIN 100,000 UNIT/GM POWD] 60 g 5    Sig: APPLY TOPICALLY 3 TIMES A DAY     Off-Protocol Failed - 06/11/2023 12:33 PM      Failed - Medication not assigned to a protocol, review manually.      Passed - Valid encounter within last 12 months    Recent Outpatient Visits           2 months ago Preoperative clearance   [place] [facility] [place], Salvadore Oxford, NP   1 year ago Encounter for general adult medical examination with abnormal findings   [place] [facility] [place], Salvadore Oxford, NP   1 year ago Primary hypertension   [place] [facility] [place], Salvadore Oxford, NP   2 years ago COVID-19   [facility] [place], Salvadore Oxford, [place]

## 2023-07-14 ENCOUNTER — Other Ambulatory Visit (HOSPITAL_COMMUNITY): Payer: Self-pay

## 2023-07-19 ENCOUNTER — Other Ambulatory Visit (HOSPITAL_COMMUNITY): Payer: Self-pay

## 2023-08-18 ENCOUNTER — Other Ambulatory Visit (HOSPITAL_COMMUNITY): Payer: Self-pay

## 2023-08-18 MED ORDER — LISDEXAMFETAMINE DIMESYLATE 30 MG PO CAPS
30.0000 mg | ORAL_CAPSULE | Freq: Every morning | ORAL | 0 refills | Status: DC
  Filled 2023-08-18: qty 30, 30d supply, fill #0

## 2023-08-18 MED ORDER — LISDEXAMFETAMINE DIMESYLATE 30 MG PO CAPS
30.0000 mg | ORAL_CAPSULE | Freq: Every morning | ORAL | 0 refills | Status: DC
  Filled 2023-10-04: qty 30, 30d supply, fill #0

## 2023-08-18 MED ORDER — LISDEXAMFETAMINE DIMESYLATE 20 MG PO CAPS
20.0000 mg | ORAL_CAPSULE | ORAL | 0 refills | Status: DC
  Filled 2023-08-18: qty 30, 30d supply, fill #0

## 2023-08-18 MED ORDER — LISDEXAMFETAMINE DIMESYLATE 20 MG PO CAPS
20.0000 mg | ORAL_CAPSULE | Freq: Every day | ORAL | 0 refills | Status: DC
  Filled 2023-11-22 (×2): qty 30, 30d supply, fill #0

## 2023-09-23 ENCOUNTER — Other Ambulatory Visit: Payer: Self-pay | Admitting: Internal Medicine

## 2023-09-24 NOTE — Telephone Encounter (Signed)
Requested by interface sursecripts. Protocol met no future visit Requested Prescriptions  Pending Prescriptions Disp Refills   pravastatin (PRAVACHOL) 10 MG tablet [Pharmacy Med Name: PRAVASTATIN SODIUM 10 MG TAB] 90 tablet 1    Sig: TAKE 1 TABLET BY MOUTH EVERY DAY     Cardiovascular:  Antilipid - Statins Failed - 09/23/2023  1:41 AM      Failed - Lipid Panel in normal range within the last 12 months    Cholesterol  Date Value Ref Range Status  03/25/2023 210 (H) <200 mg/dL Final   LDL Cholesterol (Calc)  Date Value Ref Range Status  03/25/2023 124 (H) mg/dL (calc) Final    Comment:    Reference range: <100 . Desirable range <100 mg/dL for primary prevention;   <70 mg/dL for patients with CHD or diabetic patients  with > or = 2 CHD risk factors. Marland Kitchen LDL-C is now calculated using the Coppess-Hopkins  calculation, which is a validated novel method providing  better accuracy than the Friedewald equation in the  estimation of LDL-C.  Horald Pollen et al. Lenox Ahr. 6578;469(62): 2061-2068  (http://education.[facility].com/faq/FAQ164)    HDL  Date Value Ref Range Status  03/25/2023 64 > OR = 50 mg/dL Final   Triglycerides  Date Value Ref Range Status  03/25/2023 114 <150 mg/dL Final         Passed - Patient is not pregnant      Passed - Valid encounter within last 12 months    Recent Outpatient Visits           6 months ago Preoperative clearance   [place] [place], Salvadore Oxford, NP   1 year ago Encounter for general adult medical examination with abnormal findings   [place] [facility] [place], Salvadore Oxford, NP   2 years ago Primary hypertension    [facility] [place], Salvadore Oxford, NP   2 years ago COVID-19   [facility] [place], Salvadore Oxford, [place]

## 2023-10-04 ENCOUNTER — Other Ambulatory Visit (HOSPITAL_COMMUNITY): Payer: Self-pay

## 2023-10-06 ENCOUNTER — Other Ambulatory Visit (HOSPITAL_COMMUNITY): Payer: Self-pay

## 2023-10-19 ENCOUNTER — Other Ambulatory Visit (HOSPITAL_COMMUNITY): Payer: Self-pay

## 2023-10-19 MED ORDER — LISDEXAMFETAMINE DIMESYLATE 30 MG PO CAPS
30.0000 mg | ORAL_CAPSULE | Freq: Every morning | ORAL | 0 refills | Status: DC
  Filled 2023-11-22: qty 30, 30d supply, fill #0

## 2023-10-19 MED ORDER — LISDEXAMFETAMINE DIMESYLATE 30 MG PO CAPS: 30.0000 mg | ORAL_CAPSULE | Freq: Every morning | ORAL | 0 refills | Status: DC

## 2023-10-19 MED ORDER — LISDEXAMFETAMINE DIMESYLATE 20 MG PO CAPS
20.0000 mg | ORAL_CAPSULE | Freq: Every day | ORAL | 0 refills | Status: DC
  Filled 2024-01-03: qty 30, 30d supply, fill #0

## 2023-10-19 MED ORDER — LISDEXAMFETAMINE DIMESYLATE 30 MG PO CAPS
30.0000 mg | ORAL_CAPSULE | Freq: Every morning | ORAL | 0 refills | Status: DC
  Filled 2024-01-03: qty 30, 30d supply, fill #0

## 2023-11-20 ENCOUNTER — Encounter: Payer: Self-pay | Admitting: Internal Medicine

## 2023-11-22 ENCOUNTER — Other Ambulatory Visit (HOSPITAL_COMMUNITY): Payer: Self-pay

## 2023-11-22 MED ORDER — BUPROPION HCL ER (XL) 300 MG PO TB24
300.0000 mg | ORAL_TABLET | Freq: Every morning | ORAL | 0 refills | Status: DC
  Filled 2023-11-22 (×2): qty 90, 90d supply, fill #0

## 2023-11-26 ENCOUNTER — Other Ambulatory Visit (HOSPITAL_COMMUNITY): Payer: Self-pay

## 2024-01-03 ENCOUNTER — Other Ambulatory Visit (HOSPITAL_COMMUNITY): Payer: Self-pay

## 2024-01-19 ENCOUNTER — Other Ambulatory Visit (HOSPITAL_COMMUNITY): Payer: Self-pay

## 2024-01-19 ENCOUNTER — Other Ambulatory Visit (HOSPITAL_BASED_OUTPATIENT_CLINIC_OR_DEPARTMENT_OTHER): Payer: Self-pay

## 2024-01-19 MED ORDER — LISDEXAMFETAMINE DIMESYLATE 30 MG PO CAPS
30.0000 mg | ORAL_CAPSULE | Freq: Every morning | ORAL | 0 refills | Status: DC
Start: 2024-02-29 — End: 2024-07-07
  Filled 2024-03-21: qty 30, 30d supply, fill #0

## 2024-01-19 MED ORDER — SERTRALINE HCL 100 MG PO TABS
150.0000 mg | ORAL_TABLET | Freq: Every day | ORAL | 0 refills | Status: AC
  Filled 2024-01-19: qty 135, 90d supply, fill #0

## 2024-01-19 MED ORDER — LISDEXAMFETAMINE DIMESYLATE 20 MG PO CAPS
20.0000 mg | ORAL_CAPSULE | Freq: Every day | ORAL | 0 refills | Status: DC
Start: 2024-03-01 — End: 2024-07-07
  Filled 2024-03-21: qty 30, 30d supply, fill #0

## 2024-01-19 MED ORDER — LISDEXAMFETAMINE DIMESYLATE 20 MG PO CAPS
20.0000 mg | ORAL_CAPSULE | Freq: Every day | ORAL | 0 refills | Status: DC
  Filled 2024-02-15: qty 30, 30d supply, fill #0

## 2024-01-19 MED ORDER — BUPROPION HCL ER (XL) 300 MG PO TB24
300.0000 mg | ORAL_TABLET | Freq: Every morning | ORAL | 0 refills | Status: DC
  Filled 2024-01-19: qty 90, 90d supply, fill #0

## 2024-01-19 MED ORDER — LISDEXAMFETAMINE DIMESYLATE 30 MG PO CAPS
30.0000 mg | ORAL_CAPSULE | Freq: Every morning | ORAL | 0 refills | Status: DC
Start: 2024-03-01 — End: 2024-07-07

## 2024-01-19 MED ORDER — LISDEXAMFETAMINE DIMESYLATE 30 MG PO CAPS
30.0000 mg | ORAL_CAPSULE | Freq: Every morning | ORAL | 0 refills | Status: DC
  Filled 2024-02-15: qty 30, 30d supply, fill #0

## 2024-01-19 MED ORDER — LISDEXAMFETAMINE DIMESYLATE 20 MG PO CAPS: 20.0000 mg | ORAL_CAPSULE | Freq: Every morning | ORAL | 0 refills | Status: DC

## 2024-01-28 ENCOUNTER — Other Ambulatory Visit: Payer: Self-pay | Admitting: Family Medicine

## 2024-01-28 ENCOUNTER — Encounter (HOSPITAL_COMMUNITY): Payer: Self-pay | Admitting: Family Medicine

## 2024-01-28 ENCOUNTER — Other Ambulatory Visit (HOSPITAL_COMMUNITY): Payer: Self-pay | Admitting: Family Medicine

## 2024-01-28 DIAGNOSIS — M25561 Pain in right knee: Secondary | ICD-10-CM

## 2024-02-03 ENCOUNTER — Other Ambulatory Visit (HOSPITAL_COMMUNITY): Payer: Self-pay

## 2024-02-05 ENCOUNTER — Ambulatory Visit (HOSPITAL_COMMUNITY)
Admission: RE | Admit: 2024-02-05 | Discharge: 2024-02-05 | Disposition: A | Discharge status: Home / Self Care | Payer: Self-pay | Source: Ambulatory Visit | Attending: Family Medicine | Admitting: Family Medicine

## 2024-02-05 DIAGNOSIS — M25561 Pain in right knee: Secondary | ICD-10-CM | POA: Insufficient documentation

## 2024-02-09 ENCOUNTER — Other Ambulatory Visit: Payer: Self-pay | Admitting: Internal Medicine

## 2024-02-10 NOTE — Telephone Encounter (Signed)
 Requested medication (s) are due for refill today: Yes  Requested medication (s) are on the active medication list: Yes  Last refill:  06/14/23  Future visit scheduled: No  Notes to clinic:  Manual review.    Requested Prescriptions  Pending Prescriptions Disp Refills   nystatin (MYCOSTATIN/NYSTOP) powder [Pharmacy Med Name: NYSTATIN 100,000 UNIT/GM POWD] 60 g 5    Sig: APPLY TO AFFECTED AREA 3 TIMES A DAY     Off-Protocol Failed - 02/10/2024  9:12 AM      Failed - Medication not assigned to a protocol, review manually.      Passed - Valid encounter within last 12 months    Recent Outpatient Visits           10 months ago Preoperative clearance   [place] [facility] [place], Salvadore Oxford, NP   1 year ago Encounter for general adult medical examination with abnormal findings   [place] [facility] [place], Salvadore Oxford, NP   2 years ago Primary hypertension   [place] [facility] [place], Salvadore Oxford, NP   2 years ago COVID-19   [facility] [place], Salvadore Oxford, [place]

## 2024-02-15 ENCOUNTER — Other Ambulatory Visit (HOSPITAL_COMMUNITY): Payer: Self-pay

## 2024-03-19 ENCOUNTER — Other Ambulatory Visit: Payer: Self-pay | Admitting: Internal Medicine

## 2024-03-20 NOTE — Telephone Encounter (Signed)
 OV needed for additional refills.  Requested Prescriptions  Pending Prescriptions Disp Refills   pravastatin  (PRAVACHOL ) 10 MG tablet [Pharmacy Med Name: PRAVASTATIN  SODIUM 10 MG TAB] 30 tablet 0    Sig: TAKE 1 TABLET BY MOUTH EVERY DAY     Cardiovascular:  Antilipid - Statins Failed - 03/20/2024  2:31 PM      Failed - Valid encounter within last 12 months    Recent Outpatient Visits   None            Failed - Lipid Panel in normal range within the last 12 months    Cholesterol  Date Value Ref Range Status  03/25/2023 210 (H) <200 mg/dL Final   LDL Cholesterol (Calc)  Date Value Ref Range Status  03/25/2023 124 (H) mg/dL (calc) Final    Comment:    Reference range: <100 . Desirable range <100 mg/dL for primary prevention;   <70 mg/dL for patients with CHD or diabetic patients  with > or = 2 CHD risk factors. Aaron Aas LDL-C is now calculated using the Shon-Hopkins  calculation, which is a validated novel method providing  better accuracy than the Friedewald equation in the  estimation of LDL-C.  Melinda Sprawls et al. Erroll Heard. 1610;960(45): 2061-2068  (http://education.[facility].com/faq/FAQ164)    HDL  Date Value Ref Range Status  03/25/2023 64 > OR = 50 mg/dL Final   Triglycerides  Date Value Ref Range Status  03/25/2023 114 <150 mg/dL Final         Passed - Patient is not pregnant

## 2024-03-21 ENCOUNTER — Other Ambulatory Visit (HOSPITAL_COMMUNITY): Payer: Self-pay

## 2024-04-26 ENCOUNTER — Other Ambulatory Visit: Payer: Self-pay | Admitting: Internal Medicine

## 2024-04-26 ENCOUNTER — Other Ambulatory Visit (HOSPITAL_COMMUNITY): Payer: Self-pay

## 2024-04-26 MED ORDER — LISDEXAMFETAMINE DIMESYLATE 40 MG PO CAPS
40.0000 mg | ORAL_CAPSULE | Freq: Every morning | ORAL | 0 refills | Status: DC
  Filled 2024-04-26: qty 30, 30d supply, fill #0

## 2024-04-26 MED ORDER — BUPROPION HCL ER (XL) 300 MG PO TB24
300.0000 mg | ORAL_TABLET | Freq: Every morning | ORAL | 0 refills | Status: DC
  Filled 2024-04-26: qty 90, 90d supply, fill #0

## 2024-04-26 MED ORDER — SERTRALINE HCL 100 MG PO TABS
150.0000 mg | ORAL_TABLET | Freq: Every morning | ORAL | 0 refills | Status: DC
  Filled 2024-04-26: qty 135, 90d supply, fill #0

## 2024-04-28 NOTE — Telephone Encounter (Signed)
 Requested medications are due for refill today.  yes  Requested medications are on the active medications list.  yes  Last refill. 03/20/2024 #30 0 rf  Future visit scheduled.   no  Notes to clinic.  Labs are expired. Pt already given a courtesy refill. Pt last seen 03/25/2023.    Requested Prescriptions  Pending Prescriptions Disp Refills   pravastatin  (PRAVACHOL ) 10 MG tablet [Pharmacy Med Name: PRAVASTATIN  SODIUM 10 MG TAB] 30 tablet 0    Sig: TAKE 1 TABLET BY MOUTH EVERY DAY     Cardiovascular:  Antilipid - Statins Failed - 04/28/2024 10:58 AM      Failed - Valid encounter within last 12 months    Recent Outpatient Visits   None            Failed - Lipid Panel in normal range within the last 12 months    Cholesterol  Date Value Ref Range Status  03/25/2023 210 (H) <200 mg/dL Final   LDL Cholesterol (Calc)  Date Value Ref Range Status  03/25/2023 124 (H) mg/dL (calc) Final    Comment:    Reference range: <100 . Desirable range <100 mg/dL for primary prevention;   <70 mg/dL for patients with CHD or diabetic patients  with > or = 2 CHD risk factors. Aaron Aas LDL-C is now calculated using the Carelli-Hopkins  calculation, which is a validated novel method providing  better accuracy than the Friedewald equation in the  estimation of LDL-C.  Melinda Sprawls et al. Erroll Heard. 1610;960(45): 2061-2068  (http://education.[facility].com/faq/FAQ164)    HDL  Date Value Ref Range Status  03/25/2023 64 > OR = 50 mg/dL Final   Triglycerides  Date Value Ref Range Status  03/25/2023 114 <150 mg/dL Final         Passed - Patient is not pregnant

## 2024-05-02 ENCOUNTER — Other Ambulatory Visit (HOSPITAL_COMMUNITY): Payer: Self-pay

## 2024-05-04 ENCOUNTER — Other Ambulatory Visit (HOSPITAL_COMMUNITY): Payer: Self-pay

## 2024-05-05 ENCOUNTER — Other Ambulatory Visit (HOSPITAL_COMMUNITY): Payer: Self-pay

## 2024-05-11 ENCOUNTER — Other Ambulatory Visit (HOSPITAL_COMMUNITY): Payer: Self-pay

## 2024-06-01 ENCOUNTER — Encounter (INDEPENDENT_AMBULATORY_CARE_PROVIDER_SITE_OTHER): Payer: Self-pay

## 2024-06-30 ENCOUNTER — Other Ambulatory Visit (HOSPITAL_COMMUNITY): Payer: Self-pay

## 2024-06-30 MED ORDER — LISDEXAMFETAMINE DIMESYLATE 40 MG PO CAPS
40.0000 mg | ORAL_CAPSULE | Freq: Every morning | ORAL | 0 refills | Status: DC
  Filled 2024-06-30: qty 30, 30d supply, fill #0

## 2024-07-07 ENCOUNTER — Ambulatory Visit (INDEPENDENT_AMBULATORY_CARE_PROVIDER_SITE_OTHER): Admitting: Internal Medicine

## 2024-07-07 ENCOUNTER — Encounter: Payer: Self-pay | Admitting: Internal Medicine

## 2024-07-07 VITALS — BP 138/84 | Ht 66.0 in | Wt 316.6 lb

## 2024-07-07 DIAGNOSIS — Z1211 Encounter for screening for malignant neoplasm of colon: Secondary | ICD-10-CM | POA: Diagnosis not present

## 2024-07-07 DIAGNOSIS — Z8261 Family history of arthritis: Secondary | ICD-10-CM

## 2024-07-07 DIAGNOSIS — M2550 Pain in unspecified joint: Secondary | ICD-10-CM

## 2024-07-07 DIAGNOSIS — G4733 Obstructive sleep apnea (adult) (pediatric): Secondary | ICD-10-CM | POA: Insufficient documentation

## 2024-07-07 DIAGNOSIS — Z124 Encounter for screening for malignant neoplasm of cervix: Secondary | ICD-10-CM | POA: Diagnosis not present

## 2024-07-07 DIAGNOSIS — Z1231 Encounter for screening mammogram for malignant neoplasm of breast: Secondary | ICD-10-CM

## 2024-07-07 DIAGNOSIS — Z0001 Encounter for general adult medical examination with abnormal findings: Secondary | ICD-10-CM

## 2024-07-07 MED ORDER — NYSTATIN 100000 UNIT/GM EX POWD: Freq: Three times a day (TID) | CUTANEOUS | 5 refills | Status: AC

## 2024-07-07 MED ORDER — VALACYCLOVIR HCL 1 G PO TABS: 1000.0000 mg | ORAL_TABLET | Freq: Two times a day (BID) | ORAL | 0 refills | Status: DC

## 2024-07-07 MED ORDER — PRAVASTATIN SODIUM 10 MG PO TABS: 10.0000 mg | ORAL_TABLET | Freq: Every day | ORAL | 1 refills | Status: AC

## 2024-07-07 NOTE — Patient Instructions (Signed)

## 2024-07-07 NOTE — Progress Notes (Signed)
 Subjective:    Patient ID: Maria Bright, female    DOB: 04/21/1976, 48 y.o.   MRN: [id number]  HPI  Patient presents to clinic today for her annual exam.  Flu: 10/2023 Tetanus: 01/2020 COVID: Pfizer x4 Pap smear: 08/2019 Mammogram: 06/2022 Colon screening: Never Vision screening: annually Dentist: biannually  Diet: She does eat meat. She does consumes fruits and veggies. She tries to avoid fried foods. She drinks mostly water, milk, coffee and soda. 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   acute deep vein thrombosis involving the right posterior tibial veins, and right peroneal veins   Dyspnea    due to allergies, asthma   Fibromyalgia    GERD (gastroesophageal reflux disease)    Heart palpitations    has not been seen for it   History of peripheral neuropathy    Hypertension    never has been treated with medications   Migraine    Ovarian cyst    Polycystic disease, ovaries    POC    Seasonal allergies    Sleep apnea    CPAP    Uterine fibroid     Current Outpatient Medications  Medication Sig Dispense Refill   acetaminophen  (TYLENOL ) 500 MG tablet Take 1,000 mg by mouth every 6 (six) hours as needed for mild pain.     albuterol  (VENTOLIN  HFA) 108 (90 Base) MCG/ACT inhaler Inhale 2 puffs into the lungs every 6 (six) hours as needed for wheezing or shortness of breath. 8 g 5   buPROPion  (WELLBUTRIN  XL) 300 MG 24 hr tablet TAKE 1 TABLET BY MOUTH EVERY DAY 90 tablet 0   buPROPion  (WELLBUTRIN  XL) 300 MG 24 hr tablet Take 1 tablet (300 mg total) by mouth every morning. 90 tablet 0   buPROPion  (WELLBUTRIN   XL) 300 MG 24 hr tablet Take 1 tablet (300 mg total) by mouth every morning. 90 tablet 0   buPROPion  (WELLBUTRIN  XL) 300 MG 24 hr tablet Take 1 tablet (300 mg total) by mouth in the morning. 90 tablet 0   cetirizine (ZYRTEC) 10 MG tablet Take 10 mg by mouth at bedtime.     diphenhydrAMINE (BENADRYL) 25 MG tablet Take 50 mg by mouth every 6 (six) hours as needed for itching or allergies.     fexofenadine (ALLEGRA) 180 MG tablet Take 180 mg by mouth daily.     fluticasone  (FLONASE ) 50 MCG/ACT nasal spray Place 2 sprays into both nostrils daily. 16 g 6   fluticasone  (FLOVENT  HFA) 44 MCG/ACT inhaler Flovent  HFA 44 mcg/actuation aerosol inhaler  INHALE 2 PUFFS INTO THE LUNGS TWICE A DAY FOR 30 DAYS     hydrocortisone cream 1 % Apply 1 application topically 2 (two) times daily as needed for itching.     ibuprofen  (ADVIL ) 800 MG tablet Take 1 tablet (800 mg total) by mouth every 8 (eight) hours as needed for moderate pain (pain). For AFTER surgery only 30 tablet 1   lisdexamfetamine (VYVANSE ) 20 MG capsule Take 1 capsule (20 mg total) by mouth 2 (two) times daily. 60  capsule 0   lisdexamfetamine (VYVANSE ) 20 MG capsule Take 1 capsule (20 mg total) by mouth once daily mid-day. 30 capsule 0   lisdexamfetamine (VYVANSE ) 20 MG capsule Take 1 capsule (20 mg total) by mouth daily mid-day (fill 05/18/23) 30 capsule 0   lisdexamfetamine (VYVANSE ) 20 MG capsule Take 1 capsule (20 mg total) by mouth daily mid-day (fill 05/07/23) 9 capsule 0   lisdexamfetamine (VYVANSE ) 20 MG capsule Take 1 capsule (20 mg) by mouth once daily mid day 30 capsule 0   lisdexamfetamine (VYVANSE ) 20 MG capsule Take 1 capsule (20 mg total) by mouth daily (fill 09/16/23) 30 capsule 0   lisdexamfetamine (VYVANSE ) 20 MG capsule Take 1 capsule (20 mg total) by mouth mid-day 30 capsule 0   lisdexamfetamine (VYVANSE ) 20 MG capsule Take 1 capsule (20 mg total) by mouth daily. 30 capsule 0   lisdexamfetamine (VYVANSE ) 20 MG capsule Take 1 capsule  (20 mg total) by mouth mid day 30 capsule 0   lisdexamfetamine (VYVANSE ) 20 MG capsule Take 1 capsule (20 mg total) by mouth daily. 30 capsule 0   lisdexamfetamine (VYVANSE ) 20 MG capsule Take 1 capsule (20 mg total) by mouth every morning. 30 capsule 0   lisdexamfetamine (VYVANSE ) 30 MG capsule Take 1 oral capsule by mouth every morning. 30 capsule 0   lisdexamfetamine (VYVANSE ) 30 MG capsule Take 1 capsule (30 mg total) by mouth every morning 30 capsule 0   lisdexamfetamine (VYVANSE ) 30 MG capsule Take 1 capsule (30 mg total) by mouth every morning (fill 09/16/23) 30 capsule 0   lisdexamfetamine (VYVANSE ) 30 MG capsule Take 1 capsule (30 mg total) by mouth every morning. 30 capsule 0   lisdexamfetamine (VYVANSE ) 30 MG capsule Take 1 capsule (30 mg total) by mouth in the morning. 30 capsule 0   lisdexamfetamine (VYVANSE ) 30 MG capsule Take 1 capsule (30 mg total) by mouth in the morning. 30 capsule 0   lisdexamfetamine (VYVANSE ) 30 MG capsule Take 1 capsule (30 mg total) by mouth in the morning. 30 capsule 0   lisdexamfetamine (VYVANSE ) 30 MG capsule Take 1 capsule (30 mg total) by mouth every morning. 30 capsule 0   lisdexamfetamine (VYVANSE ) 30 MG capsule Take 1 capsule (30 mg total) by mouth every morning. 30 capsule 0   lisdexamfetamine (VYVANSE ) 30 MG capsule Take 1 capsule (30 mg total) by mouth every morning. 30 capsule 0   lisdexamfetamine (VYVANSE ) 40 MG capsule Take 1 capsule (40 mg total) by mouth in the morning. 30 capsule 0   nystatin  (MYCOSTATIN /NYSTOP ) powder APPLY TOPICALLY 3 TIMES A DAY 60 g 5   omeprazole  (PRILOSEC OTC) 20 MG tablet Take 20 mg by mouth daily as needed (heartburn).     pravastatin  (PRAVACHOL ) 10 MG tablet TAKE 1 TABLET BY MOUTH EVERY DAY 30 tablet 0   pregabalin (LYRICA) 150 MG capsule Take 150 mg by mouth 2 (two) times daily.     propranolol  (INDERAL ) 10 MG tablet Take 1 tablet (10 mg total) by mouth daily as needed (anxiety). Anxiety 90 tablet 1   sertraline   (ZOLOFT ) 100 MG tablet Take 150 mg by mouth daily.     sertraline  (ZOLOFT ) 100 MG tablet Take 1.5 tablets (150 mg total) by mouth daily. 135 tablet 0   sertraline  (ZOLOFT ) 100 MG tablet Take 1.5 tablets (150 mg total) by mouth in the morning. 135 tablet 0   triamcinolone  cream (KENALOG ) 0.1 % Apply 1 Application topically 2 (two) times daily. 30 g 0   valACYclovir  (VALTREX )  1000 MG tablet Take 1 tablet (1,000 mg total) by mouth 2 (two) times daily. 14 tablet 0   No current facility-administered medications for this visit.    Allergies  Allergen Reactions   Lactose Intolerance (Gi)    Latex Hives   Morphine And Codeine Nausea And Vomiting        Other     Tomatoes, pineapple, oranges, grapefruit, lemons, limes, citrus fruits, and all nightshade vegetables    Adhesive [Tape] Rash    blisters   Nickel Rash    Family History  Problem Relation Age of Onset   Arthritis Mother    Hyperlipidemia Mother    Heart disease Mother    Hypertension Mother    Rheum arthritis Maternal Aunt    Hyperlipidemia Paternal Aunt    Diabetes Paternal Aunt    Rheum arthritis Maternal Grandmother    Heart disease Maternal Grandmother    Arthritis Maternal Grandfather    Hyperlipidemia Maternal Grandfather    Heart disease Maternal Grandfather    Hypertension Maternal Grandfather    Prostate cancer Paternal Uncle    Colon cancer Neg Hx    Breast cancer Neg Hx    Pancreatic cancer Neg Hx    Endometrial cancer Neg Hx    Ovarian cancer Neg Hx     Social History   Socioeconomic History   Marital status: Married    Spouse name: Not on file   Number of children: Not on file   Years of education: Not on file   Highest education level: Not on file  Occupational History   Not on file  Tobacco Use   Smoking status: Never   Smokeless tobacco: Never  Vaping Use   Vaping status: Never Used  Substance and Sexual Activity   Alcohol use: No    Comment: seldom    Drug use: No   Sexual activity:  Yes    Birth control/protection: None, Surgical    Comment: hUSBAND VASECTOMY  Other Topics Concern   Not on file  Social History Narrative   Not on file   Social Drivers of Health   Financial Resource Strain: Low Risk  (05/11/2024)   Received from [facility]   Overall Financial Resource Strain (CARDIA)    Difficulty of Paying Living Expenses: Not hard at all  Food Insecurity: No Food Insecurity (05/11/2024)   Received from [facility]   Hunger Vital Sign    Within the past 12 months, you worried that your food would run out before you got the money to buy more.: Never true    Within the past 12 months, the food you bought just didn't last and you didn't have money to get more.: Never true  Transportation Needs: No Transportation Needs (05/11/2024)   Received from [facility] - Transportation    Lack of Transportation (Medical): No    Lack of Transportation (Non-Medical): No  Physical Activity: Not on file  Stress: Not on file  Social Connections: Unknown (04/07/2022)   Received from [facility]   Social Network    Social Network: Not on file  Intimate Partner Violence: Unknown (03/03/2022)   Received from [facility]   HITS    Physically Hurt: Not on file    Insult or Talk Down To: Not on file    Threaten Physical Harm: Not on file    Scream or Curse: Not on file     Constitutional: Denies fever, malaise, fatigue, headache or abrupt weight changes.  HEENT: Denies eye pain, eye redness, ear pain, ringing in the ears, wax buildup, runny nose, nasal congestion, bloody nose, or sore throat. Respiratory: Denies difficulty breathing, shortness of breath, cough or sputum production.   Cardiovascular: Denies chest pain, chest tightness, palpitations or swelling in the hands or feet.  Gastrointestinal: Denies abdominal pain, bloating, constipation, diarrhea  GU: Denies urgency, frequency, pain with urination, burning sensation, blood in urine, odor or  discharge. Musculoskeletal: Patient reports right knee pain.  Denies decrease in range of motion, difficulty with gait, muscle pain or joint swelling.  Skin: Pt reports intermittent hives, yeast under breasts. Denies redness, rashes, lesions or ulcercations.  Neurological: Pt reports inattention. Denies dizziness, difficulty with memory, difficulty with speech or problems with balance and coordination.  Psych: Patient has a history of anxiety and depression.  Denies SI/HI.  No other specific complaints in a complete review of systems (except as listed in HPI above).     Objective:   BP 138/84 (BP Location: Left Arm, Patient Position: Sitting, Cuff Size: Large)   Ht 5' 6 (1.676 m)   Wt (!) 316 lb 9.6 oz (143.6 kg)   LMP  (LMP Unknown)   BMI 51.10 kg/m   Wt Readings from Last 3 Encounters:  03/25/23 (!) 302 lb (137 kg)  04/07/22 (!) 310 lb (140.6 kg)  06/25/21 (!) 312 lb 6.4 oz (141.7 kg)    General: Appears her stated age, obese, in NAD. Skin: Warm, dry and intact.  HEENT: Head: normal shape and size; Eyes: sclera white, no icterus, conjunctiva pink, PERRLA and EOMs intact;  Neck:  Neck supple, trachea midline. Thyromegaly noted. Cardiovascular: Normal rate and rhythm. S1,S2 noted.  No murmur, rubs or gallops noted. No JVD or BLE edema.  Pulmonary/Chest: Normal effort and positive vesicular breath sounds. No respiratory distress. No wheezes, rales or ronchi noted.  Abdomen: Soft and nontender. Normal bowel sounds.  Musculoskeletal: Strength 5/5 BUE/BLE.  No difficulty with gait.  Neurological: Alert and oriented. Cranial nerves II-XII grossly intact. Coordination normal.  Psychiatric: Mood and affect normal. Behavior is normal. Judgment and thought content normal.    BMET    Component Value Date/Time   NA 139 03/25/2023 1021   K 4.2 03/25/2023 1021   CL 104 03/25/2023 1021   CO2 26 03/25/2023 1021   GLUCOSE 84 03/25/2023 1021   BUN 12 03/25/2023 1021   CREATININE 0.96  03/25/2023 1021   CALCIUM 9.0 03/25/2023 1021   GFRNONAA >60 05/15/2021 0945   GFRAA >60 06/26/2020 1701    Lipid Panel     Component Value Date/Time   CHOL 210 (H) 03/25/2023 1021   TRIG 114 03/25/2023 1021   HDL 64 03/25/2023 1021   CHOLHDL 3.3 03/25/2023 1021   VLDL 19.2 02/15/2020 1514   LDLCALC 124 (H) 03/25/2023 1021    CBC    Component Value Date/Time   WBC 4.7 03/25/2023 1021   RBC 4.77 03/25/2023 1021   HGB 13.7 03/25/2023 1021   HCT 42.2 03/25/2023 1021   PLT 202 03/25/2023 1021   MCV 88.5 03/25/2023 1021   MCH 28.7 03/25/2023 1021   MCHC 32.5 03/25/2023 1021   RDW 12.9 03/25/2023 1021   LYMPHSABS 2.2 06/26/2020 1701   MONOABS 1.1 (H) 06/26/2020 1701   EOSABS 0.3 06/26/2020 1701   BASOSABS 0.1 06/26/2020 1701    Hgb A1C Lab Results  Component Value Date   HGBA1C 5.7 (H) 03/25/2023  Assessment & Plan:   Preventative Health Maintenance:  Encouraged her to get a flu shot in the fall Tetanus UTD COVID-vaccine UTD Referral to GYN for Pap smear Mammogram ordered-she will call to schedule Referral to GI for screening colonoscopy Encouraged her to consume a balanced diet and exercise regimen Advised her to see an eye doctor and dentist annually We will check CBC, c-Met, TSH, lipid, A1c today  Multiple joint pain, family history of rheumatoid arthritis:  Will check ANA, ESR, CRP and rheumatoid factor  RTC in 6 months, follow-up chronic conditions Angeline Laura, NP

## 2024-07-07 NOTE — Assessment & Plan Note (Signed)
 She will go home and get a copy of her sleep study, reports she has been wearing a CPAP for 13 years We will try to see if we can get it Zepbound 2.5 mg weekly, initiated for treatment of sleep apnea Encouraged diet and exercise for weight loss

## 2024-07-08 LAB — COMPREHENSIVE METABOLIC PANEL WITH GFR
AG Ratio: 1.6 (calc) (ref 1.0–2.5)
ALT: 30 U/L — ABNORMAL HIGH (ref 6–29)
AST: 19 U/L (ref 10–35)
Albumin: 4.2 g/dL (ref 3.6–5.1)
Alkaline phosphatase (APISO): 90 U/L (ref 31–125)
BUN/Creatinine Ratio: 17 (calc) (ref 6–22)
BUN: 17 mg/dL (ref 7–25)
CO2: 27 mmol/L (ref 20–32)
Calcium: 8.7 mg/dL (ref 8.6–10.2)
Chloride: 105 mmol/L (ref 98–110)
Creat: 1.01 mg/dL — ABNORMAL HIGH (ref 0.50–0.99)
Globulin: 2.7 g/dL (ref 1.9–3.7)
Glucose, Bld: 111 mg/dL — ABNORMAL HIGH (ref 65–99)
Potassium: 4.1 mmol/L (ref 3.5–5.3)
Sodium: 139 mmol/L (ref 135–146)
Total Bilirubin: 0.3 mg/dL (ref 0.2–1.2)
Total Protein: 6.9 g/dL (ref 6.1–8.1)
eGFR: 70 mL/min/1.73m2 (ref >=60)

## 2024-07-08 LAB — CBC
HCT: 42.8 % (ref 35.0–45.0)
Hemoglobin: 13.6 g/dL (ref 11.7–15.5)
MCH: 28.6 pg (ref 27.0–33.0)
MCHC: 31.8 g/dL — ABNORMAL LOW (ref 32.0–36.0)
MCV: 89.9 fL (ref 80.0–100.0)
MPV: 10.8 fL (ref 7.5–12.5)
Platelets: 252 Thousand/uL (ref 140–400)
RBC: 4.76 Million/uL (ref 3.80–5.10)
RDW: 13.1 % (ref 11.0–15.0)
WBC: 8.8 Thousand/uL (ref 3.8–10.8)

## 2024-07-08 LAB — HEMOGLOBIN A1C
Hgb A1c MFr Bld: 5.9 % — ABNORMAL HIGH (ref <5.7)
Mean Plasma Glucose: 123 mg/dL
eAG (mmol/L): 6.8 mmol/L

## 2024-07-08 LAB — LIPID PANEL
Cholesterol: 202 mg/dL — ABNORMAL HIGH (ref <200)
HDL: 60 mg/dL (ref >=50)
LDL Cholesterol (Calc): 118 mg/dL — ABNORMAL HIGH
Non-HDL Cholesterol (Calc): 142 mg/dL — ABNORMAL HIGH (ref <130)
Total CHOL/HDL Ratio: 3.4 (calc) (ref <5.0)
Triglycerides: 126 mg/dL (ref <150)

## 2024-07-08 LAB — SEDIMENTATION RATE: Sed Rate: 25 mm/h — ABNORMAL HIGH (ref 0–20)

## 2024-07-08 LAB — C-REACTIVE PROTEIN: CRP: 13.8 mg/L — ABNORMAL HIGH (ref <8.0)

## 2024-07-08 LAB — RHEUMATOID FACTOR: Rheumatoid fact SerPl-aCnc: <10 [IU]/mL (ref <14)

## 2024-07-08 LAB — ANA: Anti Nuclear Antibody (ANA): NEGATIVE

## 2024-07-08 LAB — TSH: TSH: 3.48 m[IU]/L

## 2024-07-09 ENCOUNTER — Ambulatory Visit: Payer: Self-pay | Admitting: Internal Medicine

## 2024-07-10 ENCOUNTER — Encounter (HOSPITAL_BASED_OUTPATIENT_CLINIC_OR_DEPARTMENT_OTHER): Payer: Self-pay

## 2024-07-10 NOTE — Telephone Encounter (Signed)
 Yes, I am not addressing all of this through a MyChart message.  It probably needs to be in person, not video so that I can see the rashes.

## 2024-07-19 ENCOUNTER — Other Ambulatory Visit (HOSPITAL_COMMUNITY): Payer: Self-pay

## 2024-07-19 MED ORDER — LISDEXAMFETAMINE DIMESYLATE 40 MG PO CAPS
40.0000 mg | ORAL_CAPSULE | Freq: Every day | ORAL | 0 refills | Status: AC
  Filled 2024-10-03: qty 30, 30d supply, fill #0

## 2024-07-19 MED ORDER — LISDEXAMFETAMINE DIMESYLATE 40 MG PO CAPS
40.0000 mg | ORAL_CAPSULE | Freq: Every morning | ORAL | 0 refills | Status: AC
  Filled 2024-11-06: qty 30, 30d supply, fill #0

## 2024-07-19 MED ORDER — LISDEXAMFETAMINE DIMESYLATE 40 MG PO CAPS
40.0000 mg | ORAL_CAPSULE | Freq: Every morning | ORAL | 0 refills | Status: AC
  Filled 2024-08-09: qty 30, 30d supply, fill #0

## 2024-08-09 ENCOUNTER — Other Ambulatory Visit (HOSPITAL_COMMUNITY): Payer: Self-pay

## 2024-08-15 ENCOUNTER — Telehealth: Payer: Self-pay | Admitting: Family Medicine

## 2024-08-15 ENCOUNTER — Other Ambulatory Visit: Payer: Self-pay | Admitting: Internal Medicine

## 2024-08-15 DIAGNOSIS — Z1231 Encounter for screening mammogram for malignant neoplasm of breast: Secondary | ICD-10-CM

## 2024-08-15 NOTE — Telephone Encounter (Signed)
 Received a call from Maria Bright stating  she wanted to schedule an appointment because the [facility] office had closed. I told her she was mistaking, that office was not closed. She said she received a letter that the [facility] was closing. After some clear understanding. She said she would call [facility] However, she works in [place]. She is going to call back to schedule for December.

## 2024-08-18 ENCOUNTER — Ambulatory Visit
Admission: RE | Admit: 2024-08-18 | Discharge: 2024-08-18 | Disposition: A | Discharge status: Home / Self Care | Source: Ambulatory Visit

## 2024-08-18 DIAGNOSIS — Z1231 Encounter for screening mammogram for malignant neoplasm of breast: Secondary | ICD-10-CM

## 2024-08-30 ENCOUNTER — Telehealth: Payer: Self-pay

## 2024-08-30 NOTE — Telephone Encounter (Signed)
 Called and spoke with patient. Patient is aware that PV appt has been cancelled and she will need an OV instead. Patient has been scheduled for an appt with Alan Coombs, PA on Tuesday, 09/12/24 at 8:40 am to discuss colonoscopy. I provided patient with the office address. Patient verbalized understanding and had no concerns at the end of the call.  [facility] PV appt cancelled

## 2024-08-30 NOTE — Telephone Encounter (Signed)
 When RN prepping chart for colonoscopy, it was noted patient's BMI over 50.  RN called patient and verified Ht and weight, BMI 51.65 so procedure will need to be done at the hospital endoscopy center.  Colonoscopy at [facility] on 10/06/24 cancelled. Dr. Suzann, please advise if you would like an OV 1st or if you are ok with a direct admit. Thank you, Maria Bright

## 2024-09-11 NOTE — Progress Notes (Unsigned)
 09/12/2024 Maria Bright [id number] 1976/04/13  Referring provider: Antonette Angeline ORN, NP Primary GI doctor: Dr. San  ASSESSMENT AND PLAN:  Screening colonoscopy No family history of colon cancer Occasional loose stools after cholecystectomy or lactuose intolerance, denies hematochezia, melena Will schedule colonoscopy at the hospital due to BMI, We have discussed the risks of bleeding, infection, perforation, medication reactions, and remote risk of death associated with colonoscopy. All questions were answered and the patient acknowledges these risk and wishes to proceed. Patient has allergies to acidic/citric additives, will do miralax bowel purge  GERD S/p cholecystectomy 2018 Has improved after surgery, some issues with carbonated beverages, bending over -alginate therapy given -Lifestyle changes discussed, avoid NSAIDS, ETOH, hand out given to the patient - consider EGD if any worsening symptoms  Morbid obesity  Body mass index is 51.84 kg/m.  -Patient has been advised to make an attempt to improve diet and exercise patterns to aid in weight loss. -Recommended diet heavy in fruits and veggies and low in animal meats, cheeses, and dairy products, appropriate calorie intake Submitting paperwork for zepbound, has not started yet, given information about holding before colonoscopy Given information about mechanism of action  OSA On a CPAP  History of provoked DVT Post surgical and BCP  Elevated liver enzymes    Latest Ref Rng & Units 07/07/2024   11:13 AM 03/25/2023   10:21 AM 04/07/2022    8:45 AM  Hepatic Function  Total Protein 6.1 - 8.1 g/dL 6.9  6.4  6.8   AST 10 - 35 U/L 19  15  16    ALT 6 - 29 U/L 30  16  17    Total Bilirubin 0.2 - 1.2 mg/dL 0.3  0.3  0.4    Platelets 252  FIB 4= 0.62, advanced fibrosis excluded, stage 0-1 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  - need LFTs and  CBC monitored every 6 months, if elevated FIB4 suggest elastography - evaluation with imaging every 2-3 years.  - Encouraged diet/exercise for modest 10% body weight loss as treatment for hepatic steatosis -Continue to work on risk factor modification including diet exercise and control of risk factors including blood sugars.   Patient Care Team: Antonette Angeline ORN, NP as PCP - General (Internal Medicine)  HISTORY OF PRESENT ILLNESS: 48 y.o. female with a past medical history listed below presents for evaluation of colonoscopy.   Discussed the use of AI scribe software for clinical note transcription with the patient, who gave verbal consent to proceed.  History of Present Illness   Maria Bright is a 47 year old female who presents for a screening colonoscopy.  She experiences occasional loose stools, which she attributes to her history of cholecystectomy in 2018 and lactose intolerance. She experiences a 'morning rush' and notes that her bowel habits are dependent on her diet. No melena or hematochezia, except for occasional hemorrhoidal bleeding twice over the last seven to ten years. No family history of colorectal cancer.  She has a history of obstructive sleep apnea and has been using a CPAP machine for 14-15 years. She recently underwent a home sleep study and is in the process of submitting information to insurance.  She experienced a deep vein thrombosis following a C5-C6 spinal fusion surgery, and recalls that her vascular doctor suggested it may have been related to a change in birth control pre-surgery. She has no other history of thromboembolic events.  Her liver function was  slightly elevated when last checked in August, with an elevated ALT level. She has no family history of liver issues, although her husband's sister passed away from liver cancer. She does not consume alcohol.  She has a history of gastroesophageal reflux disease since infancy. She occasionally experiences  reflux symptoms but does not regularly take medication for it, only as needed. Her reflux symptoms improved after her cholecystectomy.  She underwent a lumbar fusion for spondylolisthesis and a herniated disc, which she had for over 25 years.  She has food allergies, particularly to citric fruits, which cause hives and oral allergy syndrome. She is allergic to [place] 's environmental allergens, which relate to her food allergies.        She  reports that she has never smoked. She has never used smokeless tobacco. She reports that she does not drink alcohol and does not use drugs.  RELEVANT GI HISTORY, IMAGING AND LABS: Results   LABS ALT: slightly elevated (06/2024)  DIAGNOSTIC Home sleep study: completed (09/09/2024)      CBC    Component Value Date/Time   WBC 8.8 07/07/2024 1113   RBC 4.76 07/07/2024 1113   HGB 13.6 07/07/2024 1113   HCT 42.8 07/07/2024 1113   PLT 252 07/07/2024 1113   MCV 89.9 07/07/2024 1113   MCH 28.6 07/07/2024 1113   MCHC 31.8 (L) 07/07/2024 1113   RDW 13.1 07/07/2024 1113   LYMPHSABS 2.2 06/26/2020 1701   MONOABS 1.1 (H) 06/26/2020 1701   EOSABS 0.3 06/26/2020 1701   BASOSABS 0.1 06/26/2020 1701   Recent Labs    07/07/24 1113  HGB 13.6    CMP     Component Value Date/Time   NA 139 07/07/2024 1113   K 4.1 07/07/2024 1113   CL 105 07/07/2024 1113   CO2 27 07/07/2024 1113   GLUCOSE 111 (H) 07/07/2024 1113   BUN 17 07/07/2024 1113   CREATININE 1.01 (H) 07/07/2024 1113   CALCIUM 8.7 07/07/2024 1113   PROT 6.9 07/07/2024 1113   ALBUMIN 3.4 (L) 12/02/2020 1800   AST 19 07/07/2024 1113   ALT 30 (H) 07/07/2024 1113   ALKPHOS 78 12/02/2020 1800   BILITOT 0.3 07/07/2024 1113   GFRNONAA >60 05/15/2021 0945   GFRAA >60 06/26/2020 1701      Latest Ref Rng & Units 07/07/2024   11:13 AM 03/25/2023   10:21 AM 04/07/2022    8:45 AM  Hepatic Function  Total Protein 6.1 - 8.1 g/dL 6.9  6.4  6.8   AST 10 - 35 U/L 19  15  16    ALT 6 - 29 U/L  30  16  17    Total Bilirubin 0.2 - 1.2 mg/dL 0.3  0.3  0.4       Current Medications:    Current Outpatient Medications (Cardiovascular):    EPINEPHrine  0.3 mg/0.3 mL IJ SOAJ injection, INJECT INTO OUTER THIGH AS NEEDED FOR ANAPHYLAXIS   pravastatin  (PRAVACHOL ) 10 MG tablet, Take 1 tablet (10 mg total) by mouth daily.   propranolol  (INDERAL ) 10 MG tablet, Take 1 tablet (10 mg total) by mouth daily as needed (anxiety). Anxiety  Current Outpatient Medications (Respiratory):    albuterol  (VENTOLIN  HFA) 108 (90 Base) MCG/ACT inhaler, Inhale 2 puffs into the lungs every 6 (six) hours as needed for wheezing or shortness of breath.   budesonide (PULMICORT FLEXHALER) 180 MCG/ACT inhaler, 1 puff.   cetirizine (ZYRTEC) 10 MG tablet, Take 10 mg by mouth at bedtime.   diphenhydrAMINE (BENADRYL) 25  MG tablet, Take 50 mg by mouth every 6 (six) hours as needed for itching or allergies.   fluticasone  (FLONASE ) 50 MCG/ACT nasal spray, Place 2 sprays into both nostrils daily.  Current Outpatient Medications (Analgesics):    acetaminophen  (TYLENOL ) 500 MG tablet, Take 1,000 mg by mouth every 6 (six) hours as needed for mild pain.   aspirin 81 MG chewable tablet, Chew 81 mg by mouth.   ibuprofen  (ADVIL ) 800 MG tablet, Take 1 tablet (800 mg total) by mouth every 8 (eight) hours as needed for moderate pain (pain). For AFTER surgery only   Current Outpatient Medications (Other):    buPROPion  (WELLBUTRIN  XL) 300 MG 24 hr tablet, TAKE 1 TABLET BY MOUTH EVERY DAY   famotidine (PEPCID) 40 MG tablet, Take 40 mg by mouth. (Patient taking differently: Take 40 mg by mouth as needed (For hives per patient).)   hydrocortisone cream 1 %, Apply 1 application topically 2 (two) times daily as needed for itching.   lisdexamfetamine (VYVANSE ) 40 MG capsule, Take 40 mg by mouth every morning.   [START ON 09/27/2024] lisdexamfetamine (VYVANSE ) 40 MG capsule, Take 1 capsule (40 mg total) by mouth every morning.    lisdexamfetamine (VYVANSE ) 40 MG capsule, Take 1 capsule (40 mg total) by mouth daily.   lisdexamfetamine (VYVANSE ) 40 MG capsule, Take 1 capsule (40 mg total) by mouth every morning.   metaxalone  (SKELAXIN ) 800 MG tablet, Take 800 mg by mouth. (Patient taking differently: Take 800 mg by mouth as needed for muscle spasms.)   nystatin  (MYCOSTATIN /NYSTOP ) powder, Apply topically 3 (three) times daily.   omeprazole  (PRILOSEC OTC) 20 MG tablet, Take 20 mg by mouth daily as needed (heartburn).   pregabalin (LYRICA) 150 MG capsule, Take 150 mg by mouth 2 (two) times daily.   sertraline  (ZOLOFT ) 100 MG tablet, Take 1.5 tablets (150 mg total) by mouth daily.   cyclobenzaprine (FLEXERIL) 10 MG tablet, Take 10 mg by mouth.  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   acute deep vein thrombosis involving the right posterior tibial veins, and right peroneal veins   Dyspnea    due to allergies, asthma   Fibromyalgia    GERD (gastroesophageal reflux disease)    Heart palpitations    has not been seen for it   History of peripheral neuropathy    Hypertension    never has been treated with medications   Migraine    Ovarian cyst    Polycystic disease, ovaries    POC    Seasonal allergies    Sleep apnea    CPAP    Uterine fibroid    Allergies:  Allergies  Allergen Reactions   Bioflavonoids Dermatitis    citrus bioflavonoids   Cayenne Other (See Comments)    Blisters in Mouth   Flavoring Agent Other (See Comments)    Blisters in Mouth   Wound Dressing Adhesive Dermatitis    blisters   Lactose Intolerance (Gi)    Latex Hives   Morphine And Codeine  Nausea And Vomiting        Other  Tomatoes, pineapple, oranges, grapefruit, lemons, limes, citrus fruits, and all nightshade vegetables    Adhesive [Tape] Rash    blisters   Nickel Rash     Surgical History:  She  has a past surgical history that includes Wisdom tooth extraction; Cholecystectomy (N/A, 06/30/2017); Anterior cervical decomp/discectomy fusion (06/24/2020); Dilatation & currettage/hysteroscopy with hydrothermal ablation (N/A, 11/20/2020); Xi robotic assisted oophorectomy (Right, 05/15/2021); and Xi robotic assisted salpingectomy (Bilateral, 05/15/2021). Family History:  Her family history includes Arthritis in her maternal grandfather and mother; Diabetes in her paternal aunt; Heart disease in her maternal grandfather, maternal grandmother, and mother; Hyperlipidemia in her maternal grandfather, mother, and paternal aunt; Hypertension in her maternal grandfather and mother; Prostate cancer in her paternal uncle; Rheum arthritis in her maternal aunt and maternal grandmother.  REVIEW OF SYSTEMS  : All other systems reviewed and negative except where noted in the History of Present Illness.  PHYSICAL EXAM: BP 124/80   Pulse 100   Ht 5' 6 (1.676 m)   Wt (!) 321 lb 3.2 oz (145.7 kg)   BMI 51.84 kg/m  Physical Exam   GENERAL APPEARANCE: Obese, in no apparent distress. HEENT: No cervical lymphadenopathy, unremarkable thyroid , sclerae anicteric, conjunctiva pink. RESPIRATORY: Respiratory effort normal, breath sounds equal bilaterally without rales, rhonchi, or wheezing. CARDIO: Regular rate and rhythm with no murmurs, rubs, or gallops, peripheral pulses intact. ABDOMEN: Soft, non-distended, active bowel sounds in all four quadrants, no tenderness to palpation, no rebound, no mass appreciated. RECTAL: Declines. MUSCULOSKELETAL: Full range of motion, normal gait, without edema. SKIN: Dry, intact without rashes or lesions. No jaundice. NEURO: Alert, oriented, no focal  deficits. PSYCH: Cooperative, normal mood and affect.      Alan JONELLE Coombs, PA-C 9:32 AM

## 2024-09-12 ENCOUNTER — Encounter: Payer: Self-pay | Admitting: Physician Assistant

## 2024-09-12 ENCOUNTER — Ambulatory Visit: Admitting: Physician Assistant

## 2024-09-12 VITALS — BP 124/80 | HR 100 | Ht 66.0 in | Wt 321.2 lb

## 2024-09-12 DIAGNOSIS — G4733 Obstructive sleep apnea (adult) (pediatric): Secondary | ICD-10-CM

## 2024-09-12 DIAGNOSIS — Z9989 Dependence on other enabling machines and devices: Secondary | ICD-10-CM

## 2024-09-12 DIAGNOSIS — K219 Gastro-esophageal reflux disease without esophagitis: Secondary | ICD-10-CM

## 2024-09-12 DIAGNOSIS — Z1211 Encounter for screening for malignant neoplasm of colon: Secondary | ICD-10-CM | POA: Diagnosis not present

## 2024-09-12 DIAGNOSIS — R7989 Other specified abnormal findings of blood chemistry: Secondary | ICD-10-CM

## 2024-09-12 NOTE — Patient Instructions (Addendum)
 First do a trial off milk/lactose products if you use them.  Add fiber like benefiber or citracel once a day Increase activity  You have been scheduled for a colonoscopy at [facility] on 10/31/2024. Please read separate instructions given to you today at your office appointment  We have sent the following medications to your pharmacy for you to pick up at your convenience: Miralax Over the counter   You have been scheduled for a colonoscopy. Please follow written instructions given to you at your visit today.   If you use inhalers (even only as needed), please bring them with you on the day of your procedure.  DO NOT TAKE 7 DAYS PRIOR TO TEST- Trulicity (dulaglutide) Ozempic , Wegovy  (semaglutide ) Mounjaro (tirzepatide) Bydureon Bcise (exanatide extended release)  DO NOT TAKE 1 DAY PRIOR TO YOUR TEST Rybelsus  (semaglutide ) Adlyxin (lixisenatide) Victoza (liraglutide) Byetta (exanatide) ___________________________________________________________________________     FODMAP stands for fermentable oligo-, di-, mono-saccharides and polyols (1). These are the scientific terms used to classify groups of carbs that are difficult for our body to digest and that are notorious for triggering digestive symptoms like bloating, gas, loose stools and stomach pain.   You can try low FODMAP diet  - start with eliminating just one column at a time that you feel may be a trigger for you. - the table at the very bottom contains foods that are low in FODMAPs   Sometimes trying to eliminate the FODMAP's from your diet is difficult or tricky, if you are stuggling with trying to do the elimination diet you can try an enzyme.  There is a food enzymes that you sprinkle in or on your food that helps break down the FODMAP. You can read more about the enzyme by going to this site: https://fodzyme.com/    Avoid spicy and acidic foods Avoid fatty foods Limit your intake of coffee, tea, alcohol,  and carbonated drinks Work to maintain a healthy weight Keep the head of the bed elevated at least 3 inches with blocks or a wedge pillow if you are having any nighttime symptoms Stay upright for 2 hours after eating Avoid meals and snacks three to four hours before bedtime  Reflux Gourmet Rescue  It is an ALGINATE THERAPY which is the only intervention that works to safeguard the esophagus by creating a protective barrier that actually stops reflux from happening. -The general directions for use are as stated on the packaging: Take 1 teaspoon (5 ml), or more as needed or as directed by your physician, after meals and before bed. -These general directions address the most common times for reflux to occur, but our Rescue products may be taken anytime. Some individuals may take our product preemptively, when they know they will suffer from reflux, or as needed - when discomfort arises. (If taken around food, it should be consumed last.) -You do not have to take 1 teaspoon (5 ml) of the product. While one teaspoon (5ml) may be the perfect average amount to relieve reflux suffering in some, others may require more or less. You may adjust the amount of Mint Chocolate Rescue and Vanilla Caramel Rescue to the lowest amount necessary to meet your individual needs to improve your quality of life. -You may dilute the product if it is too viscous for you to consume. Keep in mind, however, that the thickness of the product was formulated to provide optimal coating and protection of your throat and esophagus. Though diluting the product is possible, it may reduce  the protective function and/or length of action. -This can be used in conjunction with reflux medications and lifestyle changes.  100% ALL-NATURAL  Paraben FREE, glycerin FREE, & potassium FREE  Made entirely from all-natural ingredients considered safe for children and during pregnancy  No known side effects  All-natural flavor Gluten FREE  Allergen FREE   Vegan  Can find more information here: NameSeizer.co.nz  Understanding Your Weekly GLP-1 Injection  A helpful guide to managing common side effects  You are on a once-weekly injectable medication in the GLP-1 receptor agonist class. These medications can be very effective for blood sugar control, weight loss, and heart protection, fatty liver or OSA, but they can also come with some side effects that are important to understand. The good news is: most side effects can be managed with a few adjustments.  1. Gastroparesis-Like Symptoms These medications slow down your stomach to help you feel full longer -- great for weight loss and blood sugar control, but they can sometimes cause symptoms that feel like gastroparesis (slow stomach emptying). Symptoms may include: -Feeling full quickly when eating -Nausea or vomiting -Bloating or abdominal discomfort -Worsening heartburn or reflux -Acid regurgitation -Stomach spasms or tightness What you can do: ??? Eat small, frequent meals (4-6 per day) ?? Drink fluids between meals, not during ?? Avoid high-fiber foods (like raw veggies or whole grains); cook your veggies well ?? Spread protein throughout the day (try [place] yogurt, eggs, soft meats, Glucerna, milk) ?? Choose soft foods you can mash with a fork ?? Switch to pured foods or liquids during flare-ups ?? Consider reading: Living Well with Gastroparesis by Camelia Bone ?? Downloadable Diet Guide: [facility] Gastroparesis Diet PDF  ?? Tip: Try following a gastroparesis-friendly diet on the day of your injection and the day after, when the medication's effect is strongest.  2. Constipation Since this medication slows down your digestive system, constipation is very common. Tips to help: ?? Drink plenty of water ???? Stay active with regular exercise ?? Add fiber-rich but gentle foods like kiwi ?? Try a low-dose magnesium supplement  at night ?? Use MiraLAX (half to one capful daily) if constipation becomes more frequent, especially if your dose increases  If these strategies don't help, talk to your provider -- they may recommend or prescribe other treatments.  3. When to Call the Doctor or Go to the ER While rare, this medication can slightly increase your risk of serious conditions like: Pancreatitis (inflammation of the pancreas) Gallstones or gallbladder problems Watch for these signs and seek help if you experience: Severe abdominal pain (especially in the upper belly or that radiates to your back) Pain in the right upper side of your abdomen Nausea, vomiting, fever, or chills that don't go away ?? Call your provider or go to the ER if these occur.  4. Who Should NOT Take This Medication? This medication should be avoided if you have: A personal history of pancreatitis  A personal or family history of medullary thyroid  cancer A condition called Multiple Endocrine Neoplasia Syndrome Type 2 (MEN2)  Final Note If the side effects are too bothersome, remember: Most symptoms will go away if you stop the medication. But many people tolerate it well after the first few weeks, especially with the right strategies in place.  Metabolic dysfunction associated seatohepatitis  Now the leading cause of liver failure in the [place] .  It is normally from such risk factors as obesity, diabetes, insulin resistance, high cholesterol, or metabolic syndrome.  The only definitive therapy is weight loss and exercise.   Suggest walking 20-30 mins daily.  Decreasing carbohydrates, increasing veggies.    Fatty Liver Fatty liver is the accumulation of fat in liver cells. It is also called hepatosteatosis or steatohepatitis. It is normal for your liver to contain some fat. If fat is more than 5 to 10% of your liver's weight, you have fatty liver.  There are often no symptoms (problems) for years while damage is still  occurring. People often learn about their fatty liver when they have medical tests for other reasons. Fat can damage your liver for years or even decades without causing problems. When it becomes severe, it can cause fatigue, weight loss, weakness, and confusion. This makes you more likely to develop more serious liver problems. The liver is the largest organ in the body. It does a lot of work and often gives no warning signs when it is sick until late in a disease. The liver has many important jobs including: Breaking down foods. Storing vitamins, iron, and other minerals. Making proteins. Making bile for food digestion. Breaking down many products including medications, alcohol and some poisons.  PROGNOSIS  Fatty liver may cause no damage or it can lead to an inflammation of the liver. This is, called steatohepatitis.  Over time the liver may become scarred and hardened. This condition is called cirrhosis. Cirrhosis is serious and may lead to liver failure or cancer. NASH is one of the leading causes of cirrhosis. About 10-20% of Americans have fatty liver and a smaller 2-5% has NASH.  TREATMENT  Weight loss, fat restriction, and exercise in overweight patients produces inconsistent results but is worth trying. Good control of diabetes may reduce fatty liver. Eat a balanced, healthy diet. Increase your physical activity. There are no medical or surgical treatments for a fatty liver or NASH, but improving your diet and increasing your exercise may help prevent or reverse some of the damage.  Due to recent changes in healthcare laws, you may see the results of your imaging and laboratory studies on MyChart before your provider has had a chance to review them.  We understand that in some cases there may be results that are confusing or concerning to you. Not all laboratory results come back in the same time frame and the provider may be waiting for multiple results in order to interpret others.   Please give us  48 hours in order for your provider to thoroughly review all the results before contacting the office for clarification of your results.    I appreciate the  opportunity to care for you  Thank You   [facility]

## 2024-09-21 ENCOUNTER — Encounter

## 2024-10-03 ENCOUNTER — Other Ambulatory Visit (HOSPITAL_COMMUNITY): Payer: Self-pay

## 2024-10-06 ENCOUNTER — Encounter: Admitting: Pediatrics

## 2024-10-11 ENCOUNTER — Telehealth (INDEPENDENT_AMBULATORY_CARE_PROVIDER_SITE_OTHER): Admitting: Internal Medicine

## 2024-10-11 ENCOUNTER — Encounter: Payer: Self-pay | Admitting: Internal Medicine

## 2024-10-11 DIAGNOSIS — M5442 Lumbago with sciatica, left side: Secondary | ICD-10-CM

## 2024-10-11 DIAGNOSIS — G4733 Obstructive sleep apnea (adult) (pediatric): Secondary | ICD-10-CM | POA: Diagnosis not present

## 2024-10-11 DIAGNOSIS — M5441 Lumbago with sciatica, right side: Secondary | ICD-10-CM

## 2024-10-11 DIAGNOSIS — M25561 Pain in right knee: Secondary | ICD-10-CM

## 2024-10-11 DIAGNOSIS — B372 Candidiasis of skin and nail: Secondary | ICD-10-CM | POA: Diagnosis not present

## 2024-10-11 DIAGNOSIS — F439 Reaction to severe stress, unspecified: Secondary | ICD-10-CM

## 2024-10-11 DIAGNOSIS — G43119 Migraine with aura, intractable, without status migrainosus: Secondary | ICD-10-CM | POA: Insufficient documentation

## 2024-10-11 DIAGNOSIS — G8929 Other chronic pain: Secondary | ICD-10-CM | POA: Insufficient documentation

## 2024-10-11 DIAGNOSIS — M25562 Pain in left knee: Secondary | ICD-10-CM

## 2024-10-11 MED ORDER — PREGABALIN 150 MG PO CAPS: 150.0000 mg | ORAL_CAPSULE | Freq: Two times a day (BID) | ORAL | 0 refills | Status: DC

## 2024-10-11 MED ORDER — SUMATRIPTAN SUCCINATE 50 MG PO TABS: 50.0000 mg | ORAL_TABLET | ORAL | 2 refills | Status: AC | PRN

## 2024-10-11 NOTE — Patient Instructions (Signed)
 Migraine Headache A migraine headache is a very strong throbbing pain on one or both sides of your head. This type of headache can also cause other symptoms. It can last from 4 hours to 3 days. Talk with your doctor about what things may bring on (trigger) this condition. What are the causes? The exact cause of a migraine is not known. This condition may be brought on or caused by: Smoking. Medicines, such as: Medicine used to treat chest pain (nitroglycerin). Birth control pills. Estrogen. Some blood pressure medicines. Certain substances in some foods or drinks. Foods and drinks, such as: Cheese. Chocolate. Alcohol. Caffeine. Doing physical activity that is very hard. Other things that may trigger a migraine headache include: Periods. Pregnancy. Hunger. Stress. Getting too much or too little sleep. Weather changes. Feeling tired (fatigue). What increases the risk? Being 18-65 years old. Being female. Having a family history of migraine headaches. Being Caucasian. Having a mental health condition, such as being sad (depressed) or feeling worried or nervous (anxious). Being very overweight (obese). What are the signs or symptoms? A throbbing pain. This pain may: Happen in any area of the head, such as on one or both sides. Make it hard to do daily activities. Get worse with physical activity. Get worse around bright lights, loud noises, or smells. Other symptoms may include: Feeling like you may vomit (nauseous). Vomiting. Dizziness. Before a migraine headache starts, you may get warning signs (an aura). An aura may include: Seeing flashing lights or having blind spots. Seeing bright spots, halos, or zigzag lines. Having tunnel vision or blurred vision. Having numbness or a tingling feeling. Having trouble talking. Having weak muscles. After a migraine ends, you may have symptoms. These may include: Tiredness. Trouble thinking (concentrating). How is this  treated? Taking medicines that: Relieve pain. Relieve the feeling like you may vomit. Prevent migraine headaches. Treatment may also include: Acupuncture. Lifestyle changes like avoiding foods that bring on migraine headaches. Learning ways to control your body functions (biofeedback). Therapy to help you know and deal with negative thoughts (cognitive behavioral therapy). Follow these instructions at home: Medicines Take over-the-counter and prescription medicines only as told by your doctor. If told, take steps to prevent problems with pooping (constipation). You may need to: Drink enough fluid to keep your pee (urine) pale yellow. Take medicines. You will be told what medicines to take. Eat foods that are high in fiber. These include beans, whole grains, and fresh fruits and vegetables. Limit foods that are high in fat and sugar. These include fried or sweet foods. Ask your doctor if you should avoid driving or using machines while you are taking your medicine. Lifestyle  Do not drink alcohol. Do not smoke or use any products that contain nicotine or tobacco. If you need help quitting, ask your doctor. Get 7-9 hours of sleep each night, or the amount recommended by your doctor. Find ways to deal with stress, such as meditation, deep breathing, or yoga. Try to exercise often. This can help lessen how bad and how often your migraines happen. General instructions Keep a journal to find out what may bring on your migraine headaches. This can help you avoid those things. For example, write down: What you eat and drink. How much sleep you get. Any change to your medicines or diet. If you have a migraine headache: Avoid things that make your symptoms worse, such as bright lights. Lie down in a dark, quiet room. Do not drive or use machinery. Ask your  doctor what activities are safe for you. Where to find more information Coalition for Headache and Migraine Patients (CHAMP):  headachemigraine.org American Migraine Foundation: americanmigrainefoundation.org National Headache Foundation: headaches.org Contact a doctor if: You get a migraine headache that is different or worse than others you have had. You have more than 15 days of headaches in one month. Get help right away if: Your migraine headache gets very bad. Your migraine headache lasts more than 72 hours. You have a fever or stiff neck. You have trouble seeing. Your muscles feel weak or like you cannot control them. You lose your balance a lot. You have trouble walking. You faint. You have a seizure. This information is not intended to replace advice given to you by your health care provider. Make sure you discuss any questions you have with your health care provider. Document Revised: 07/13/2022 Document Reviewed: 07/13/2022 Elsevier Patient Education  2024 [employer].

## 2024-10-11 NOTE — Progress Notes (Signed)
 Virtual Visit via Video Note  I connected with Maria Bright on 10/11/24 at 11:20 AM EST by a video enabled telemedicine application and verified that I am speaking with the correct person using two identifiers.  Location: Patient: Work Provider: Engineer, Structural in this video call: Angeline Laura, NP-C and Julene Rahn   I discussed the limitations of evaluation and management by telemedicine and the availability of in person appointments. The patient expressed understanding and agreed to proceed.  History of Present Illness:  Discussed the use of AI scribe software for clinical note transcription with the patient, who gave verbal consent to proceed.   Maria Bright is a 48 year old female who presents with migraine headaches and a rash.  She experiences migraine headaches.  These can occur 1-2 times a week for weeks at a time.  She typically does have less than 15 headaches a month.  They are accompanied by dizziness, sensitive with light and sound, aura and nausea but without vomiting.  She feels like they are triggered by stress.  She takes Tylenol , ibuprofen  and Excedrin OTC with minimal relief of symptoms she has been using her husband's sumatriptan 50 mg with good relief of symptoms and would like to see if she can get her own prescription for this.  She has a rash underneath her breast and abdominal fold that is red and irritated.  This is a chronic issue.  The area sometimes opens up if she accidentally scratches the area.  She does use powder to try to keep the area dry and free from moisture.  She has also used nystatin  in the past.  She knows that her weight is a contributing factor to this.  She has been unsuccessful with weight loss in the past.  She has undergone a sleep study that  indicated moderate to severe sleep apnea, but she does not have a copy of the results at this time.  She is wondering if insurance would cover Zepbound for her sleep  apnea.  She also has chronic bilateral knee pain with associated nerve pain.  She is currently taking pregabalin 150 mg daily but was on this twice daily in the past which she found more beneficial.  She does need a knee replacement but has been unable to lose the required amount of weight to have this procedure done.  She would like a refill on the pregabalin today.       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   acute deep vein thrombosis involving the right posterior tibial veins, and right peroneal veins   Dyspnea    due to allergies, asthma   Fibromyalgia    GERD (gastroesophageal reflux disease)    Heart palpitations    has not been seen for it   History of peripheral neuropathy    Hypertension    never has been treated with medications   Migraine    Ovarian cyst    Polycystic disease, ovaries    POC    Seasonal allergies    Sleep apnea    CPAP  Uterine fibroid     Current Outpatient Medications  Medication Sig Dispense Refill   acetaminophen  (TYLENOL ) 500 MG tablet Take 1,000 mg by mouth every 6 (six) hours as needed for mild pain.     albuterol  (VENTOLIN  HFA) 108 (90 Base) MCG/ACT inhaler Inhale 2 puffs into the lungs every 6 (six) hours as needed for wheezing or shortness of breath. 8 g 5   aspirin 81 MG chewable tablet Chew 81 mg by mouth.     budesonide (PULMICORT FLEXHALER) 180 MCG/ACT inhaler 1 puff.     buPROPion  (WELLBUTRIN  XL) 300 MG 24 hr tablet TAKE 1 TABLET BY MOUTH EVERY DAY 90 tablet 0   cetirizine (ZYRTEC) 10 MG tablet Take 10 mg by mouth at bedtime.     cyclobenzaprine (FLEXERIL) 10 MG tablet Take 10 mg by mouth.      diphenhydrAMINE (BENADRYL) 25 MG tablet Take 50 mg by mouth every 6 (six) hours as needed for itching or allergies.     EPINEPHrine  0.3 mg/0.3 mL IJ SOAJ injection INJECT INTO OUTER THIGH AS NEEDED FOR ANAPHYLAXIS     famotidine (PEPCID) 40 MG tablet Take 40 mg by mouth. (Patient taking differently: Take 40 mg by mouth as needed (For hives per patient).)     fluticasone  (FLONASE ) 50 MCG/ACT nasal spray Place 2 sprays into both nostrils daily. 16 g 6   hydrocortisone cream 1 % Apply 1 application topically 2 (two) times daily as needed for itching.     ibuprofen  (ADVIL ) 800 MG tablet Take 1 tablet (800 mg total) by mouth every 8 (eight) hours as needed for moderate pain (pain). For AFTER surgery only 30 tablet 1   lisdexamfetamine (VYVANSE ) 40 MG capsule Take 40 mg by mouth every morning.     lisdexamfetamine (VYVANSE ) 40 MG capsule Take 1 capsule (40 mg total) by mouth every morning. 30 capsule 0   lisdexamfetamine (VYVANSE ) 40 MG capsule Take 1 capsule (40 mg total) by mouth daily. 30 capsule 0   lisdexamfetamine (VYVANSE ) 40 MG capsule Take 1 capsule (40 mg total) by mouth every morning. 30 capsule 0   metaxalone  (SKELAXIN ) 800 MG tablet Take 800 mg by mouth. (Patient taking differently: Take 800 mg by mouth as needed for muscle spasms.)     nystatin  (MYCOSTATIN /NYSTOP ) powder Apply topically 3 (three) times daily. 60 g 5   omeprazole  (PRILOSEC OTC) 20 MG tablet Take 20 mg by mouth daily as needed (heartburn).     pravastatin  (PRAVACHOL ) 10 MG tablet Take 1 tablet (10 mg total) by mouth daily. 90 tablet 1   pregabalin (LYRICA) 150 MG capsule Take 150 mg by mouth 2 (two) times daily.     propranolol  (INDERAL ) 10 MG tablet Take 1 tablet (10 mg total) by mouth daily as needed (anxiety). Anxiety 90 tablet 1   sertraline  (ZOLOFT ) 100 MG tablet Take 1.5 tablets (150 mg total) by mouth daily. 135 tablet 0   No current facility-administered medications for this visit.    Allergies  Allergen Reactions    Bioflavonoids Dermatitis    citrus bioflavonoids   Cayenne Other (See Comments)    Blisters in Mouth   Flavoring Agent Other (See Comments)    Blisters in Mouth   Wound Dressing Adhesive Dermatitis    blisters   Lactose Intolerance (Gi)    Latex Hives   Morphine And Codeine Nausea And Vomiting        Other     Tomatoes, pineapple, oranges, grapefruit, lemons, limes, citrus fruits, and  all nightshade vegetables    Adhesive [Tape] Rash    blisters   Nickel Rash    Family History  Problem Relation Age of Onset   Arthritis Mother    Hyperlipidemia Mother    Heart disease Mother    Hypertension Mother    Rheum arthritis Maternal Aunt    Hyperlipidemia Paternal Aunt    Diabetes Paternal Aunt    Rheum arthritis Maternal Grandmother    Heart disease Maternal Grandmother    Arthritis Maternal Grandfather    Hyperlipidemia Maternal Grandfather    Heart disease Maternal Grandfather    Hypertension Maternal Grandfather    Prostate cancer Paternal Uncle    Colon cancer Neg Hx    Breast cancer Neg Hx    Pancreatic cancer Neg Hx    Endometrial cancer Neg Hx    Ovarian cancer Neg Hx     Social History   Socioeconomic History   Marital status: Married    Spouse name: Not on file   Number of children: Not on file   Years of education: Not on file   Highest education level: Not on file  Occupational History   Not on file  Tobacco Use   Smoking status: Never   Smokeless tobacco: Never  Vaping Use   Vaping status: Never Used  Substance and Sexual Activity   Alcohol use: No    Comment: seldom    Drug use: No   Sexual activity: Yes    Birth control/protection: None, Surgical    Comment: hUSBAND VASECTOMY  Other Topics Concern   Not on file  Social History Narrative   Not on file   Social Drivers of Health   Financial Resource Strain: Low Risk  (05/11/2024)   Received from [facility]   Overall Financial Resource Strain (CARDIA)    Difficulty of Paying Living  Expenses: Not hard at all  Food Insecurity: No Food Insecurity (05/11/2024)   Received from [facility]   Hunger Vital Sign    Within the past 12 months, you worried that your food would run out before you got the money to buy more.: Never true    Within the past 12 months, the food you bought just didn't last and you didn't have money to get more.: Never true  Transportation Needs: No Transportation Needs (05/11/2024)   Received from [facility] - Transportation    Lack of Transportation (Medical): No    Lack of Transportation (Non-Medical): No  Physical Activity: Not on file  Stress: Not on file  Social Connections: Unknown (04/07/2022)   Received from [facility]   Social Network    Social Network: Not on file  Intimate Partner Violence: Unknown (03/03/2022)   Received from [facility]   HITS    Physically Hurt: Not on file    Insult or Talk Down To: Not on file    Threaten Physical Harm: Not on file    Scream or Curse: Not on file     Constitutional: Patient reports headaches.  Denies fever, malaise, fatigue, or abrupt weight changes.  HEENT: Denies eye pain, eye redness, ear pain, ringing in the ears, wax buildup, runny nose, nasal congestion, bloody nose, or sore throat. Respiratory: Denies difficulty breathing, shortness of breath, cough or sputum production.   Cardiovascular: Denies chest pain, chest tightness, palpitations or swelling in the hands or feet.  Gastrointestinal: Denies abdominal pain, bloating, constipation, diarrhea or blood in the stool.  GU: Denies urgency, frequency, pain with  urination, burning sensation, blood in urine, odor or discharge. Musculoskeletal: Patient reports chronic back and knee pain.  Denies decrease in range of motion, difficulty with gait, or joint swelling.  Skin: Patient reports rash under breast.  Denies lesions or ulcercations.  Neurological: Patient reports inattention, lightheadedness, sensitivity to light and sounds.   Denies dizziness, difficulty with memory, difficulty with speech or problems with balance and coordination.  Psych: Patient has a history of anxiety and depression.  Denies SI/HI.  No other specific complaints in a complete review of systems (except as listed in HPI above).  Observations/Objective:   Wt Readings from Last 3 Encounters:  09/12/24 (!) 321 lb 3.2 oz (145.7 kg)  08/30/24 (!) 320 lb (145.2 kg)  07/07/24 (!) 316 lb 9.6 oz (143.6 kg)    General: Appears her stated age, obese, in NAD. Skin: Unable to visualize on video visit. Pulmonary/Chest: Normal effort. No respiratory distress.  Musculoskeletal: Normal range of motion. No signs of joint swelling. No difficulty with gait.  Neurological: Alert and oriented. Psychiatric: Mood and affect normal. Behavior is normal. Judgment and thought content normal.    BMET    Component Value Date/Time   NA 139 07/07/2024 1113   K 4.1 07/07/2024 1113   CL 105 07/07/2024 1113   CO2 27 07/07/2024 1113   GLUCOSE 111 (H) 07/07/2024 1113   BUN 17 07/07/2024 1113   CREATININE 1.01 (H) 07/07/2024 1113   CALCIUM 8.7 07/07/2024 1113   GFRNONAA >60 05/15/2021 0945   GFRAA >60 06/26/2020 1701    Lipid Panel     Component Value Date/Time   CHOL 202 (H) 07/07/2024 1113   TRIG 126 07/07/2024 1113   HDL 60 07/07/2024 1113   CHOLHDL 3.4 07/07/2024 1113   VLDL 19.2 02/15/2020 1514   LDLCALC 118 (H) 07/07/2024 1113    CBC    Component Value Date/Time   WBC 8.8 07/07/2024 1113   RBC 4.76 07/07/2024 1113   HGB 13.6 07/07/2024 1113   HCT 42.8 07/07/2024 1113   PLT 252 07/07/2024 1113   MCV 89.9 07/07/2024 1113   MCH 28.6 07/07/2024 1113   MCHC 31.8 (L) 07/07/2024 1113   RDW 13.1 07/07/2024 1113   LYMPHSABS 2.2 06/26/2020 1701   MONOABS 1.1 (H) 06/26/2020 1701   EOSABS 0.3 06/26/2020 1701   BASOSABS 0.1 06/26/2020 1701    Hgb A1C Lab Results  Component Value Date   HGBA1C 5.9 (H) 07/07/2024       Assessment and  Plan:  Assessment and Plan    Migraine with aura Intermittent migraines with aura, characterized by dizziness, sensitive to light and sound and nausea. - Prescribed sumatriptan 50 mg as needed. - Encouraged stress reduction techniques - Consider preventative treatment if frequency increases.  Candidiasis of skin Recurrent skin candidiasis, likely due to moisture retention. - Advised against towel rubbing; use hairdryer to dry. - Suggested Interdry fabric to prevent moisture retention. - Continue nystatin  topical powder as needed - Encourage weight loss as this can help reduce topical yeast infections  Obstructive sleep apnea Suspected moderate to severe obstructive sleep apnea based on previous study. - Requested sleep study copy for confirmation. - Once received, will submit request for Zepbound 2.5 mg weekly pending insurance approval. - Advised to appeal denial with medical necessity letter if needed. - She will consider Zepbound to the [employer] care program if unsuccessful getting this approved through insurance  Chronic back/knee pain Obesity contributing to this issue.  Current pregabalin dose ineffective -  Increase pregabalin 250 mg twice daily - Encouraged weight loss as this can help reduce joint pain - She will continue to follow with orthopedics  Morbid obesity Contributing to skin candidiasis and obstructive sleep apnea. - Discussed weight loss as primary intervention. - Recommended [employer] for Zepbound delivery.       RTC in 3 months for follow-up of chronic conditions Follow Up Instructions:    I discussed the assessment and treatment plan with the patient. The patient was provided an opportunity to ask questions and all were answered. The patient agreed with the plan and demonstrated an understanding of the instructions.   The patient was advised to call back or seek an in-person evaluation if the symptoms worsen or if the condition fails to improve as  anticipated.   Angeline Laura, NP

## 2024-10-17 ENCOUNTER — Ambulatory Visit: Payer: Self-pay | Admitting: Internal Medicine

## 2024-10-17 MED ORDER — ZEPBOUND 2.5 MG/0.5ML ~~LOC~~ SOAJ: 2.5000 mg | SUBCUTANEOUS | 0 refills | Status: DC

## 2024-10-18 ENCOUNTER — Other Ambulatory Visit (HOSPITAL_COMMUNITY): Payer: Self-pay

## 2024-10-18 MED ORDER — LISDEXAMFETAMINE DIMESYLATE 40 MG PO CAPS: 40.0000 mg | ORAL_CAPSULE | Freq: Every day | ORAL | 0 refills | Status: AC

## 2024-10-18 MED ORDER — LISDEXAMFETAMINE DIMESYLATE 40 MG PO CAPS
40.0000 mg | ORAL_CAPSULE | Freq: Every morning | ORAL | 0 refills | Status: AC
  Filled 2024-12-08: qty 30, 30d supply, fill #0

## 2024-10-23 ENCOUNTER — Encounter (HOSPITAL_COMMUNITY): Payer: Self-pay | Admitting: Gastroenterology

## 2024-10-24 ENCOUNTER — Encounter: Payer: Self-pay | Admitting: Internal Medicine

## 2024-10-25 ENCOUNTER — Other Ambulatory Visit: Payer: Self-pay

## 2024-10-25 ENCOUNTER — Telehealth: Payer: Self-pay

## 2024-10-25 ENCOUNTER — Other Ambulatory Visit (HOSPITAL_COMMUNITY): Payer: Self-pay

## 2024-10-25 MED ORDER — PREGABALIN 150 MG PO CAPS
150.0000 mg | ORAL_CAPSULE | Freq: Two times a day (BID) | ORAL | 0 refills | Status: DC
  Filled 2024-10-25 (×2): qty 60, 30d supply, fill #0

## 2024-10-25 NOTE — Telephone Encounter (Signed)
 Called and spoke with patient. Confirmed 10/31/24 colonoscopy at [facility] arriving at 8 am with a care partner. Patient will be doing Miralax bowel prep and knows that she will purchase OTC. Patient requested that I send her instructions via MyChart. Patient had no concerns at the end of the call.

## 2024-10-25 NOTE — Telephone Encounter (Signed)
 Procedure:COLON Procedure date: 10/31/24 Procedure location: [facility] Arrival Time: 8:04 Spoke with the patient Y/N: N Any prep concerns? N  Has the patient obtained the prep from the pharmacy ? N Do you have a care partner and transportation: N Any additional concerns? N   I called patient on 3 different occasion and got the voicemail. I left a detail message about the procedure and the office number in case patient has questions are concerns.

## 2024-10-31 ENCOUNTER — Encounter (HOSPITAL_COMMUNITY): Payer: Self-pay | Admitting: Anesthesiology

## 2024-10-31 ENCOUNTER — Other Ambulatory Visit: Payer: Self-pay

## 2024-10-31 ENCOUNTER — Ambulatory Visit (HOSPITAL_COMMUNITY): Payer: Self-pay | Admitting: Anesthesiology

## 2024-10-31 ENCOUNTER — Encounter (HOSPITAL_COMMUNITY): Payer: Self-pay | Admitting: Gastroenterology

## 2024-10-31 ENCOUNTER — Ambulatory Visit (HOSPITAL_COMMUNITY)
Admission: RE | Admit: 2024-10-31 | Discharge: 2024-10-31 | Disposition: A | Discharge status: Home / Self Care | Attending: Gastroenterology | Admitting: Gastroenterology

## 2024-10-31 ENCOUNTER — Encounter (HOSPITAL_COMMUNITY)
Admission: RE | Disposition: A | Discharge status: Home / Self Care | Payer: Self-pay | Source: Home / Self Care | Attending: Gastroenterology

## 2024-10-31 DIAGNOSIS — K2100 Gastro-esophageal reflux disease with esophagitis, without bleeding: Secondary | ICD-10-CM

## 2024-10-31 DIAGNOSIS — F418 Other specified anxiety disorders: Secondary | ICD-10-CM

## 2024-10-31 DIAGNOSIS — K319 Disease of stomach and duodenum, unspecified: Secondary | ICD-10-CM

## 2024-10-31 DIAGNOSIS — I10 Essential (primary) hypertension: Secondary | ICD-10-CM | POA: Diagnosis not present

## 2024-10-31 DIAGNOSIS — K2970 Gastritis, unspecified, without bleeding: Secondary | ICD-10-CM | POA: Diagnosis not present

## 2024-10-31 DIAGNOSIS — Z1211 Encounter for screening for malignant neoplasm of colon: Secondary | ICD-10-CM

## 2024-10-31 DIAGNOSIS — K640 First degree hemorrhoids: Secondary | ICD-10-CM

## 2024-10-31 HISTORY — PX: ESOPHAGOGASTRODUODENOSCOPY: SHX5428

## 2024-10-31 HISTORY — PX: BIOPSY OF SKIN SUBCUTANEOUS TISSUE AND/OR MUCOUS MEMBRANE: SHX6741

## 2024-10-31 HISTORY — PX: COLONOSCOPY: SHX5424

## 2024-10-31 SURGERY — COLONOSCOPY
Anesthesia: Monitor Anesthesia Care

## 2024-10-31 MED ORDER — PROPOFOL 1000 MG/100ML IV EMUL
INTRAVENOUS | Status: AC
  Filled 2024-10-31: qty 100

## 2024-10-31 MED ORDER — PROPOFOL 500 MG/50ML IV EMUL
INTRAVENOUS | Status: DC | PRN
  Administered 2024-10-31: 120 ug/kg/min via INTRAVENOUS

## 2024-10-31 MED ORDER — PROPOFOL 10 MG/ML IV BOLUS
INTRAVENOUS | Status: DC | PRN
  Administered 2024-10-31: 70 mg via INTRAVENOUS

## 2024-10-31 MED ORDER — SODIUM CHLORIDE 0.9 % IV SOLN: INTRAVENOUS | Status: DC

## 2024-10-31 MED ORDER — SODIUM CHLORIDE 0.9 % IV SOLN: INTRAVENOUS | Status: DC | PRN

## 2024-10-31 MED ORDER — PANTOPRAZOLE SODIUM 40 MG PO TBEC: 40.0000 mg | DELAYED_RELEASE_TABLET | Freq: Two times a day (BID) | ORAL | 1 refills | Status: AC

## 2024-10-31 MED ORDER — PROPOFOL 500 MG/50ML IV EMUL
INTRAVENOUS | Status: AC
  Filled 2024-10-31: qty 50

## 2024-10-31 NOTE — Telephone Encounter (Signed)
 Patient's procedures have been completed.

## 2024-10-31 NOTE — Interval H&P Note (Signed)
 History and Physical Interval Note:  10/31/2024 9:27 AM  Maria Bright  has presented today for surgery, with the diagnosis of screening colon   BMI>50.  The various methods of treatment have been discussed with the patient and family. After consideration of risks, benefits and other options for treatment, the patient has consented to  Procedure(s): COLONOSCOPY (N/A) as a surgical intervention.  The patient's history has been reviewed, patient examined, no change in status, stable for surgery.  I have reviewed the patient's chart and labs.  Questions were answered to the patient's satisfaction.     Sandor GAILS Alyana Kreiter

## 2024-10-31 NOTE — H&P (Signed)
 GASTROENTEROLOGY PROCEDURE H&P NOTE   Primary Care Physician: Antonette Angeline ORN, NP    Reason for Procedure:   GERD, colon cancer screening  Plan:    EGD, colonoscopy   Patient is appropriate for endoscopic procedure(s) at [facility].  The nature of the procedure, as well as the risks, benefits, and alternatives were carefully and thoroughly reviewed with the patient. Ample time for discussion and questions allowed. The patient understood, was satisfied, and agreed to proceed. I personally addressed all patient questions and concerns.     HPI: Maria Bright is a 48 y.o. female who presents for EGD for evaluation of longstanding, persistent reflux symptoms along with colonoscopy for CRC screening.  Procedures scheduled at [facility] due to elevated periprocedural risks from underlying obesity with BMI >50, OSA.  Otherwise no significant changes in clinical history since last OV on 09/12/2024.  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 Donnajean Eddy)   CHOLECYSTECTOMY N/A 06/30/2017   Procedure: LAPAROSCOPIC CHOLECYSTECTOMY;  Surgeon: Stevie Herlene Righter, MD;  Location: [facility];  Service: General;  Laterality: N/A;   DILITATION & CURRETTAGE/HYSTROSCOPY WITH HYDROTHERMAL ABLATION N/A 11/20/2020   Procedure: DILATATION & CURETTAGE/HYSTEROSCOPY WITH HYDROTHERMAL ABLATION AND RESECTION OF FIBROID WITH MYOSURE;  Surgeon: Fredirick Glenys RAMAN, MD;  Location: [facility] OR;  Service: Gynecology;  Laterality: N/A;   WISDOM TOOTH EXTRACTION     XI ROBOTIC ASSISTED OOPHORECTOMY Right 05/15/2021   Procedure: XI ROBOTIC ASSISTED RIGHT SALPINGO-OOPHORECTOMY;  Surgeon: Viktoria Comer SAUNDERS, MD;  Location: [facility];  Service: Gynecology;  Laterality: Right;   XI ROBOTIC ASSISTED SALPINGECTOMY Bilateral 05/15/2021   Procedure: XI ROBOTIC ASSISTED LEFT SALPINGECTOMY;  Surgeon: Viktoria Comer SAUNDERS, MD;  Location: [facility];  Service: Gynecology;  Laterality: Bilateral;    Prior to Admission medications   Medication Sig Start Date End Date Taking? Authorizing Provider  acetaminophen  (TYLENOL ) 500 MG tablet Take 1,000 mg by mouth every 6 (six) hours as needed for mild pain.   Yes [provider]  aspirin 81 MG chewable tablet Chew 81 mg by mouth. 05/20/23  Yes [provider]  budesonide (PULMICORT FLEXHALER) 180 MCG/ACT inhaler 1 puff. 08/26/22  Yes [provider]  buPROPion  (WELLBUTRIN  XL) 300 MG 24 hr tablet TAKE 1 TABLET BY MOUTH EVERY DAY 08/04/22  Yes Baity,  Angeline ORN, NP  cetirizine (ZYRTEC) 10 MG tablet Take 10 mg by mouth at bedtime.   Yes [provider]  diphenhydrAMINE (BENADRYL) 25 MG tablet Take 50 mg by mouth every 6 (six) hours as needed for itching or allergies.   Yes [provider]  fluticasone  (FLONASE ) 50 MCG/ACT nasal spray Place 2 sprays into both nostrils daily. 02/26/20  Yes Baity, Angeline ORN, NP  hydrocortisone cream 1 % Apply 1 application topically 2 (two) times daily as needed for  itching.   Yes [provider]  ibuprofen  (ADVIL ) 800 MG tablet Take 1 tablet (800 mg total) by mouth every 8 (eight) hours as needed for moderate pain (pain). For AFTER surgery only 05/06/21  Yes Cross, Melissa D, NP  lisdexamfetamine (VYVANSE ) 40 MG capsule Take 40 mg by mouth every morning.   Yes [provider]  metaxalone  (SKELAXIN ) 800 MG tablet Take 800 mg by mouth. Patient taking differently: Take 800 mg by mouth as needed for muscle spasms. 11/30/21  Yes [provider]  nystatin  (MYCOSTATIN /NYSTOP ) powder Apply topically 3 (three) times daily. 07/07/24  Yes Baity, Angeline ORN, NP  omeprazole  (PRILOSEC OTC) 20 MG tablet Take 20 mg by mouth daily as needed (heartburn).   Yes [provider]  pravastatin  (PRAVACHOL ) 10 MG tablet Take 1 tablet (10 mg total) by mouth daily. 07/07/24  Yes Antonette Angeline ORN, NP  pregabalin  (LYRICA ) 150 MG capsule Take 1 capsule (150 mg total) by mouth 2 (two) times daily. 10/25/24  Yes Antonette Angeline ORN, NP  propranolol  (INDERAL ) 10 MG tablet Take 1 tablet (10 mg total) by mouth daily as needed (anxiety). Anxiety 07/03/21  Yes Antonette Angeline ORN, NP  sertraline  (ZOLOFT ) 100 MG tablet Take 1.5 tablets (150 mg total) by mouth daily. 01/19/24  Yes   SUMAtriptan  (IMITREX ) 50 MG tablet Take 1 tablet (50 mg total) by mouth every 2 (two) hours as needed for migraine. May repeat in 2 hours if headache persists or recurs. 10/11/24  Yes Antonette Angeline ORN, NP  albuterol  (VENTOLIN  HFA) 108 (90 Base) MCG/ACT inhaler Inhale 2 puffs into the lungs every 6 (six) hours as needed for wheezing or shortness of breath. 02/26/20   Baity, Angeline ORN, NP  EPINEPHrine  0.3 mg/0.3 mL IJ SOAJ injection INJECT INTO OUTER THIGH AS NEEDED FOR ANAPHYLAXIS    [provider]  famotidine (PEPCID) 40 MG tablet Take 40 mg by mouth. Patient taking differently: Take 40 mg by mouth as needed (For hives per patient). 02/10/23   [provider]  lisdexamfetamine (VYVANSE ) 40 MG  capsule Take 1 capsule (40 mg total) by mouth every morning. 09/27/24     lisdexamfetamine (VYVANSE ) 40 MG capsule Take 1 capsule (40 mg total) by mouth daily. 08/28/24     lisdexamfetamine (VYVANSE ) 40 MG capsule Take 1 capsule (40 mg total) by mouth every morning. 07/29/24     lisdexamfetamine (VYVANSE ) 40 MG capsule Take 1 capsule (40 mg total) by mouth every morning. 12/01/24 12/01/24     lisdexamfetamine (VYVANSE ) 40 MG capsule Take 1 capsule (40 mg total) by mouth daily. 01/01/25 01/01/25     tirzepatide  (ZEPBOUND ) 2.5 MG/0.5ML Pen Inject 2.5 mg into the skin once a week. 10/17/24   Antonette Angeline ORN, NP    Current Facility-Administered Medications  Medication Dose Route Frequency Provider Last Rate Last Admin   0.9 %  sodium chloride  infusion   Intravenous Continuous Collier, Amanda R, PA-C        Allergies as of  09/12/2024 - Review Complete 09/12/2024  Allergen Reaction Noted   Bioflavonoids Dermatitis 05/07/2023   Cayenne Other (See Comments) 06/24/2020   Flavoring agent Other (See Comments) 06/24/2020   Wound dressing adhesive Dermatitis 06/18/2017   Lactose intolerance (gi)  05/09/2021   Latex Hives 05/09/2021   Morphine and codeine Nausea And Vomiting 06/04/2017   Other  05/09/2021   Adhesive [tape] Rash 06/18/2017   Nickel Rash 09/19/2012    Family History  Problem Relation Age of Onset   Arthritis Mother    Hyperlipidemia Mother    Heart disease Mother    Hypertension Mother    Rheum arthritis Maternal Aunt    Hyperlipidemia Paternal Aunt    Diabetes Paternal Aunt    Rheum arthritis Maternal Grandmother    Heart disease Maternal Grandmother    Arthritis Maternal Grandfather    Hyperlipidemia Maternal Grandfather    Heart disease Maternal Grandfather    Hypertension Maternal Grandfather    Prostate cancer Paternal Uncle    Colon cancer Neg Hx    Breast cancer Neg Hx    Pancreatic cancer Neg Hx    Endometrial cancer Neg Hx    Ovarian cancer Neg Hx     Social  History   Socioeconomic History   Marital status: Married    Spouse name: Not on file   Number of children: Not on file   Years of education: Not on file   Highest education level: Not on file  Occupational History   Not on file  Tobacco Use   Smoking status: Never   Smokeless tobacco: Never  Vaping Use   Vaping status: Never Used  Substance and Sexual Activity   Alcohol use: No    Comment: seldom    Drug use: No   Sexual activity: Yes    Birth control/protection: None, Surgical    Comment: hUSBAND VASECTOMY  Other Topics Concern   Not on file  Social History Narrative   Not on file   Social Drivers of Health   Financial Resource Strain: Low Risk  (05/11/2024)   Received from [facility]   Overall Financial Resource Strain (CARDIA)    Difficulty of Paying Living Expenses: Not hard at all  Food Insecurity: No Food Insecurity (05/11/2024)   Received from [facility]   Hunger Vital Sign    Within the past 12 months, you worried that your food would run out before you got the money to buy more.: Never true    Within the past 12 months, the food you bought just didn't last and you didn't have money to get more.: Never true  Transportation Needs: No Transportation Needs (05/11/2024)   Received from [facility] - Transportation    Lack of Transportation (Medical): No    Lack of Transportation (Non-Medical): No  Physical Activity: Not on file  Stress: Not on file  Social Connections: Not on file  Intimate Partner Violence: Not on file    Physical Exam: Vital signs in last 24 hours: @There  were no vitals taken for this visit. GEN: NAD EYE: Sclerae anicteric ENT: MMM CV: Non-tachycardic Pulm: CTA b/l GI: Soft, NT/ND NEURO:  Alert & Oriented x 3   Sandor Flatter, DO [facility]   10/31/2024 8:35 AM

## 2024-10-31 NOTE — Anesthesia Postprocedure Evaluation (Signed)
 Anesthesia Post Note  Patient: Maria Bright  Procedure(s) Performed: COLONOSCOPY EGD (ESOPHAGOGASTRODUODENOSCOPY) BIOPSY, SKIN, SUBCUTANEOUS TISSUE, OR MUCOUS MEMBRANE     Patient location during evaluation: PACU Anesthesia Type: MAC Level of consciousness: awake and alert Pain management: pain level controlled Vital Signs Assessment: post-procedure vital signs reviewed and stable Respiratory status: spontaneous breathing, nonlabored ventilation, respiratory function stable and patient connected to nasal cannula oxygen Cardiovascular status: stable and blood pressure returned to baseline Postop Assessment: no apparent nausea or vomiting Anesthetic complications: no   No notable events documented.  Last Vitals:  Vitals:   10/31/24 1050 10/31/24 1055  BP: 118/62   Pulse: 71 75  Resp: (!) 21 17  Temp:    SpO2: 100% 99%    Last Pain:  Vitals:   10/31/24 1055  TempSrc:   PainSc: 0-No pain                 [name]

## 2024-10-31 NOTE — Op Note (Signed)
 [facility] Patient Name: Maria Bright Procedure Date: 10/31/2024 MRN: [id number] Attending MD: Sandor Flatter , MD, [id number] Date of Birth: 1976/09/21 CSN: [id number] Age: 48 Admit Type: Outpatient Procedure:                Colonoscopy Indications:              Screening for colorectal malignant neoplasm Providers:                Sandor Flatter, MD, Jacquelyn Jaci Pierce, RN,                            Fairy Marina, Technician Referring MD:              Medicines:                Monitored Anesthesia Care Complications:            No immediate complications. Estimated Blood Loss:     Estimated blood loss: none. Procedure:                Pre-Anesthesia Assessment:                           - Prior to the procedure, a History and Physical                            was performed, and patient medications and                            allergies were reviewed. The patient's tolerance of                            previous anesthesia was also reviewed. The risks                            and benefits of the procedure and the sedation                            options and risks were discussed with the patient.                            All questions were answered, and informed consent                            was obtained. Prior Anticoagulants: The patient has                            taken no anticoagulant or antiplatelet agents. ASA                            Grade Assessment: III - A patient with severe                            systemic disease. After reviewing the risks and  benefits, the patient was deemed in satisfactory                            condition to undergo the procedure.                           After obtaining informed consent, the colonoscope                            was passed under direct vision. Throughout the                            procedure, the patient's blood pressure, pulse, and                             oxygen saturations were monitored continuously. The                            [id number]-HQ190L ([id number]) Olympus colonoscope was                            introduced through the anus and advanced to the the                            terminal ileum. The colonoscopy was performed                            without difficulty. The patient tolerated the                            procedure well. The quality of the bowel                            preparation was good. The terminal ileum, ileocecal                            valve, appendiceal orifice, and rectum were                            photographed. Scope In: 10:09:21 AM Scope Out: 10:27:43 AM Scope Withdrawal Time: 0 hours 10 minutes 47 seconds  Total Procedure Duration: 0 hours 18 minutes 22 seconds  Findings:      The perianal and digital rectal examinations were normal.      The entire colon appeared normal.      The terminal ileum appeared normal.      Non-bleeding internal hemorrhoids were found during retroflexion. The       hemorrhoids were small and Grade I (internal hemorrhoids that do not       prolapse). Impression:               - The entire examined colon is normal.                           - The examined portion of the ileum was normal.                           -  Non-bleeding internal hemorrhoids.                           - No specimens collected. Moderate Sedation:      Not Applicable - Patient had care per Anesthesia. Recommendation:           - Patient has a contact number available for                            emergencies. The signs and symptoms of potential                            delayed complications were discussed with the                            patient. Return to normal activities tomorrow.                            Written discharge instructions were provided to the                            patient.                           - Resume previous diet.                           -  Continue present medications.                           - Repeat colonoscopy in 10 years for screening                            purposes. Procedure Code(s):        --- Professional ---                           H9878, Colorectal cancer screening; colonoscopy on                            individual not meeting criteria for high risk Diagnosis Code(s):        --- Professional ---                           Z12.11, Encounter for screening for malignant                            neoplasm of colon                           K64.0, First degree hemorrhoids CPT copyright 2022 American Medical Association. All rights reserved. The codes documented in this report are preliminary and upon coder review may  be revised to meet current compliance requirements. Sandor Flatter, MD 10/31/2024 10:41:59 AM Number of Addenda: 0

## 2024-10-31 NOTE — Discharge Instructions (Signed)

## 2024-10-31 NOTE — Transfer of Care (Signed)
 Immediate Anesthesia Transfer of Care Note  Patient: Maria Bright  Procedure(s) Performed: COLONOSCOPY EGD (ESOPHAGOGASTRODUODENOSCOPY) BIOPSY, SKIN, SUBCUTANEOUS TISSUE, OR MUCOUS MEMBRANE  Patient Location: PACU  Anesthesia Type:MAC  Level of Consciousness: awake  Airway & Oxygen Therapy: Patient Spontanous Breathing and Patient connected to face mask oxygen  Post-op Assessment: Report given to RN and Post -op Vital signs reviewed and stable  Post vital signs: Reviewed and stable  Last Vitals:  Vitals Value Taken Time  BP 130/76 10/31/24 10:32  Temp 36.7 C 10/31/24 10:32  Pulse 80 10/31/24 10:36  Resp 24 10/31/24 10:36  SpO2 100 % 10/31/24 10:36  Vitals shown include unfiled device data.  Last Pain:  Vitals:   10/31/24 1032  TempSrc: Temporal  PainSc: Asleep         Complications: No notable events documented.

## 2024-10-31 NOTE — Op Note (Signed)
 [facility] Patient Name: Maria Bright Procedure Date: 10/31/2024 MRN: [id number] Attending MD: Sandor Flatter , MD, [id number] Date of Birth: [birth year] CSN: [id number] Age: 48 Admit Type: Outpatient Procedure:                upper endoscopy Indications:              Heartburn, Suspected esophageal reflux Providers:                Sandor Flatter, MD, Jacquelyn Jaci Pierce, RN,                            Fairy Marina, Technician Referring MD:              Medicines:                Monitored Anesthesia Care Complications:            No immediate complications. Estimated Blood Loss:     Estimated blood loss was minimal. Procedure:                Pre-Anesthesia Assessment:                           - Prior to the procedure, a History and Physical                            was performed, and patient medications and                            allergies were reviewed. The patient's tolerance of                            previous anesthesia was also reviewed. The risks                            and benefits of the procedure and the sedation                            options and risks were discussed with the patient.                            All questions were answered, and informed consent                            was obtained. Prior Anticoagulants: The patient has                            taken no anticoagulant or antiplatelet agents. ASA                            Grade Assessment: III - A patient with severe                            systemic disease. After reviewing the risks and  benefits, the patient was deemed in satisfactory                            condition to undergo the procedure.                           After obtaining informed consent, the endoscope was                            passed under direct vision. Throughout the                            procedure, the patient's blood pressure, pulse, and                             oxygen saturations were monitored continuously. The                            [id number]-[id number] ([id number]) Olympus endoscope was introduced                            through the mouth, and advanced to the second part                            of duodenum. The upper GI endoscopy was                            accomplished without difficulty. The patient                            tolerated the procedure well. Scope In: Scope Out: Findings:      LA Grade A (one or more mucosal breaks less than 5 mm, not extending       between tops of 2 mucosal folds) esophagitis with no bleeding was found       38 cm from the incisors.      The gastroesophageal flap valve was visualized endoscopically and       classified as Hill Grade II (fold present, opens with respiration).      The upper third of the esophagus and middle third of the esophagus were       normal.      Scattered mild inflammation characterized by erythema was found in the       gastric body and in the gastric antrum. There were a few small,       nonbleeding shallow erosions in the prepyloric antrum. Biopsies were       taken from the antrum and gastric body with a cold forceps for histology       and Helicobacter pylori testing. Estimated blood loss was minimal.      The examined duodenum was normal. Impression:               - LA Grade A reflux esophagitis with no bleeding.                           - Gastroesophageal flap valve classified as Hill  Grade II (fold present, opens with respiration).                           - Normal upper third of esophagus and middle third                            of esophagus.                           - Gastritis. Biopsied.                           - Normal examined duodenum. Moderate Sedation:      Not Applicable - Patient had care per Anesthesia. Recommendation:           - Patient has a contact number available for                            emergencies.  The signs and symptoms of potential                            delayed complications were discussed with the                            patient. Return to normal activities tomorrow.                            Written discharge instructions were provided to the                            patient.                           - Resume previous diet.                           - Await pathology results.                           - Use Protonix (pantoprazole) 40 mg PO BID for 4                            weeks to promote mucosal healing of erosive                            esophagitis and gastritis. If reflux symptoms                            well-controlled, reduce to Protonix 40 mg daily and                            can titrate to the lowest effective dose for                            ongoing reflux management.                           -  Perform a colonoscopy today.                           - Follow-up in the GI clinic in 3-6 months or                            sooner as needed for continued management of reflux. Procedure Code(s):        --- Professional ---                           [phone], Esophagogastroduodenoscopy, flexible,                            transoral; with biopsy, single or multiple Diagnosis Code(s):        --- Professional ---                           K21.00, Gastro-esophageal reflux disease with                            esophagitis, without bleeding                           K29.70, Gastritis, unspecified, without bleeding                           R12, Heartburn CPT copyright 2022 American Medical Association. All rights reserved. The codes documented in this report are preliminary and upon coder review may  be revised to meet current compliance requirements. Sandor Flatter, MD 10/31/2024 10:37:41 AM Number of Addenda: 0

## 2024-10-31 NOTE — Anesthesia Preprocedure Evaluation (Signed)
 Anesthesia Evaluation  Patient identified by MRN, date of birth, ID band Patient awake    Reviewed: Allergy & Precautions, H&P , NPO status , Patient's Chart, lab work & pertinent test results, reviewed documented beta blocker date and time   History of Anesthesia Complications (+) history of anesthetic complications  Airway Mallampati: II  TM Distance: >3 FB Neck ROM: Full    Dental no notable dental hx. (+) Teeth Intact, Dental Advisory Given   Pulmonary asthma , sleep apnea and Continuous Positive Airway Pressure Ventilation    Pulmonary exam normal breath sounds clear to auscultation       Cardiovascular hypertension, Pt. on medications and Pt. on home beta blockers  Rhythm:Regular Rate:Normal     Neuro/Psych  Headaches PSYCHIATRIC DISORDERS Anxiety Depression     Neuromuscular disease    GI/Hepatic Neg liver ROS,GERD  Medicated,,  Endo/Other    Class 3 obesity  Renal/GU negative Renal ROS  negative genitourinary   Musculoskeletal  (+) Arthritis , Osteoarthritis,  Fibromyalgia -  Abdominal   Peds  Hematology negative hematology ROS (+)   Anesthesia Other Findings   Reproductive/Obstetrics negative OB ROS                              Anesthesia Physical Anesthesia Plan  ASA: 3  Anesthesia Plan: MAC   Post-op Pain Management: Minimal or no pain anticipated   Induction: Intravenous  PONV Risk Score and Plan: 4 or greater and Ondansetron   Airway Management Planned: Natural Airway, Simple Face Mask and Mask  Additional Equipment: None  Intra-op Plan:   Post-operative Plan:   Informed Consent: I have reviewed the patients History and Physical, chart, labs and discussed the procedure including the risks, benefits and alternatives for the proposed anesthesia with the patient or authorized representative who has indicated his/her understanding and acceptance.     Dental  advisory given  Plan Discussed with: CRNA and Anesthesiologist  Anesthesia Plan Comments:          Anesthesia Quick Evaluation

## 2024-11-01 ENCOUNTER — Encounter (HOSPITAL_COMMUNITY): Payer: Self-pay | Admitting: Gastroenterology

## 2024-11-01 LAB — SURGICAL PATHOLOGY

## 2024-11-06 ENCOUNTER — Other Ambulatory Visit (HOSPITAL_COMMUNITY): Payer: Self-pay

## 2024-11-06 ENCOUNTER — Ambulatory Visit: Payer: Self-pay | Admitting: Gastroenterology

## 2024-11-08 ENCOUNTER — Other Ambulatory Visit: Payer: Self-pay | Admitting: Obstetrics and Gynecology

## 2024-11-20 NOTE — H&P (Signed)
 Maria Bright is a 48 y.o. female, S/P endometrial ablation,  P; 2-0-4-2,  who presents for hysteroscopic removal of an endometrial lesion and placement of a Mirena IUD because of irregular bleeding and thickened endometrium. Patient underwent a hydrothermal ablation with hysteroscopic myomectomy in 2022 with good results until 6 month ago when she  began experiencing random spotting, each month,  lasting up to 4 days.  On occasion the bleed resembled a menstrual flow and with each episode there was cramping.  A pelvic ultrasound 11/07/24 revealed: uterine volume = 138 cc; 7.42 x 6.07 x 5.78 cm, and an  ill-defined heterogeneous endometrium containing a  hypervascular mass within the endometrium (? fibroid vs hyperplasia)  measuring 27.14 mm; right ovary-surgically absent and left ovary-4.42 cm. Given patient's history of fibroids, recent  symptoms and findings on ultrasound, the patient wishes to proceed with surgical management.     Past Medical History  OB History: G:6;  P: 2-0-4-2  GYN History: menarche: 47 yo;   LMP: 09/13/2024;    Contraception: vasectomy;  Denies history of abnormal PAP smear.   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   Family History: Pancreatic Cancer, Endometriosis, Hypertension, Dementia, Hypercholesterolemia, Thyroid  Disease, Breast Cancer, Blood Coagulation Disorder, Diabetes Mellitus, Osteoporosis, and Heart Diseae.  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   Medication: Albuterol  Inhaler HFA 90 mcg 2 puffs every 4-6 hours as  needed for cough/wheeze Buproprion HCL XL 300 mg daily Cyclobenzaprine 10 mg 3 times a day as needed for muscle spasms EPI-Pen Autoject as needed for allergic reaction Fluticasone  Propionate 50 mcg/actuation 1 spray in each nostril daily Meloxicam 7.5 mg daily with food Nystatin  100,000 unit/gram Powder apply to affected area 3 times daily Pravastatin  10 mg daily Pregabalin  150 mg at bedtime Propranolol  10 mg as needed Pulmicort Flexhaler 180 mgc/actuation 1 puff in lungs twice a day Sertraline  100 mg daily Sumatriptan  50 mg as directed as needed Valacyclovir  1 gram take 1 tablet twice a day as directed Vyvanse  40 mg daily Zyrtec 10 mg daily    Allergies[1]: Adhesives, Latex, Nickel, Shellfish, 2-Octyl Cyanoacrylate-rash;  and  Lactose-abdominal pain and bradycardia   ROS: Denies headache, vision changes, nasal congestion, dysphagia, tinnitus, dizziness, hoarseness, cough,  chest pain, shortness of breath, nausea, vomiting, diarrhea,constipation,  urinary frequency, urgency  dysuria, hematuria, vaginitis symptoms, pelvic pain, swelling of joints,easy bruising,  myalgias, arthralgias, skin rashes, unexplained weight loss and except as is mentioned in the history of present illness, patient's review of systems is otherwise negative.    Physical Exam  Bp: 128/86; Weight: 319 lbs.; Height: 5'6; BMI: 51.5  Neck: supple without masses or thyromegaly Lungs: clear to auscultation Heart: regular rate and rhythm Abdomen: soft, non-tender and no organomegaly Pelvic:EGBUS- wnl; vagina-normal rugae; uterus- (exam limited by habitus) normal size, cervix without lesions or motion tenderness; adnexae-no tenderness or masses Extremities:  no clubbing, cyanosis or edema   Assesment: Abnormal Uterine Bleeding                      Thickened Endometrium   Disposition:  A discussion was held with patient regarding the indication for her procedure(s) along with the risks,  which include but are not  limited to: reaction to anesthesia, damage to adjacent organs (bladder, ureters, bowel, nerves, uterus), infection and excessive bleeding.  The patient verbalized understanding of these risks and has consented to proceed with Hysteroscopy, Dilatation, Curettage, Possible Fibroid Resection with Myosure and Placement of a Mirena IUD at [facility] on December 07, 2024.   CSN# [id number]   Asad Keeven J. Perri, PA-C  for Dr. Nena LABOR. [place]     [1]  Allergies Allergen Reactions   Bioflavonoids Dermatitis    citrus bioflavonoids   Cayenne Other (See Comments)    Blisters in Mouth   Flavoring Agent Other (See Comments)    Blisters in Mouth   Wound Dressing Adhesive Dermatitis    blisters   Lactose Intolerance (Gi)    Latex Hives   Morphine And Codeine Nausea And Vomiting        Other     Tomatoes, pineapple, oranges, grapefruit, lemons, limes, citrus fruits, and all nightshade vegetables    Adhesive [Tape] Rash    blisters   Nickel Rash

## 2024-11-27 ENCOUNTER — Other Ambulatory Visit (HOSPITAL_COMMUNITY): Payer: Self-pay

## 2024-11-27 ENCOUNTER — Other Ambulatory Visit: Payer: Self-pay | Admitting: Internal Medicine

## 2024-11-28 ENCOUNTER — Other Ambulatory Visit (HOSPITAL_COMMUNITY): Payer: Self-pay

## 2024-11-28 ENCOUNTER — Encounter (HOSPITAL_COMMUNITY): Payer: Self-pay | Admitting: Obstetrics and Gynecology

## 2024-11-28 MED ORDER — PREGABALIN 150 MG PO CAPS
150.0000 mg | ORAL_CAPSULE | Freq: Two times a day (BID) | ORAL | 0 refills | Status: DC
  Filled 2024-11-28: qty 60, 30d supply, fill #0

## 2024-11-28 NOTE — Telephone Encounter (Signed)
 Requested medications are due for refill today.  yes  Requested medications are on the active medications list.  yes  Last refill. 10/25/2024 #60 0 rf  Future visit scheduled.   yes  Notes to clinic.  Refill not delegated.    Requested Prescriptions  Pending Prescriptions Disp Refills   pregabalin  (LYRICA ) 150 MG capsule 60 capsule 0    Sig: Take 1 capsule (150 mg total) by mouth 2 (two) times daily.     Not Delegated - Neurology:  Anticonvulsants - Controlled - pregabalin  Failed - 11/28/2024  2:36 PM      Failed - This refill cannot be delegated      Failed - Cr in normal range and within 360 days    Creat  Date Value Ref Range Status  07/07/2024 1.01 (H) 0.50 - 0.99 mg/dL Final         Passed - Completed PHQ-2 or PHQ-9 in the last 360 days      Passed - Valid encounter within last 12 months    Recent Outpatient Visits           1 month ago Intractable migraine with aura without status migrainosus   [place] [place], Angeline ORN, NP   4 months ago Encounter for general adult medical examination with abnormal findings   [place] [facility] [place], Angeline ORN, NP

## 2024-12-04 ENCOUNTER — Encounter (HOSPITAL_COMMUNITY): Payer: Self-pay | Admitting: Obstetrics and Gynecology

## 2024-12-04 ENCOUNTER — Encounter (HOSPITAL_COMMUNITY): Payer: Self-pay

## 2024-12-04 ENCOUNTER — Other Ambulatory Visit (HOSPITAL_COMMUNITY): Payer: Self-pay

## 2024-12-04 NOTE — Progress Notes (Signed)
 Spoke w/ via phone for pre-op interview--- Maria Bright needs dos---- CBC, T&S, BMP and UPT per surgeon.         Bright results------ COVID test -----patient states asymptomatic no test needed Arrive at -------1215 NPO after MN NO Solid Food.  Clear liquids from MN until---1115 Pre-Surgery Ensure or G2:  Med rec completed Medications to take morning of surgery -----Bring Albuterol  inhaler. Bupropion , Protonix , Lyrica , Zoloft , Propranolol  and Imitrex  PRN.  Diabetic medication -----  GLP1 agonist last dose: GLP1 instructions: Zepbound  on med list but patient has not started. Pt knows to not start until after surgery.  Patient instructed no nail polish to be worn day of surgery Patient instructed to bring photo id and insurance card day of surgery Patient aware to have Driver (ride ) / caregiver    for 24 hours after surgery - Daughter Maria Bright Patient Special Instructions ----- Pre-Op special Instructions -----  Patient verbalized understanding of instructions that were given at this phone interview. Patient denies chest pain, sob, fever, cough at the interview.

## 2024-12-07 ENCOUNTER — Encounter (HOSPITAL_COMMUNITY): Payer: Self-pay | Admitting: Obstetrics and Gynecology

## 2024-12-07 ENCOUNTER — Encounter (HOSPITAL_COMMUNITY)
Admission: RE | Disposition: A | Discharge status: Home / Self Care | Payer: Self-pay | Source: Home / Self Care | Attending: Obstetrics and Gynecology

## 2024-12-07 ENCOUNTER — Ambulatory Visit (HOSPITAL_COMMUNITY)
Admission: RE | Admit: 2024-12-07 | Discharge: 2024-12-07 | Disposition: A | Discharge status: Home / Self Care | Attending: Obstetrics and Gynecology | Admitting: Obstetrics and Gynecology

## 2024-12-07 ENCOUNTER — Ambulatory Visit (HOSPITAL_COMMUNITY)

## 2024-12-07 DIAGNOSIS — N939 Abnormal uterine and vaginal bleeding, unspecified: Secondary | ICD-10-CM

## 2024-12-07 DIAGNOSIS — F418 Other specified anxiety disorders: Secondary | ICD-10-CM | POA: Diagnosis not present

## 2024-12-07 DIAGNOSIS — E785 Hyperlipidemia, unspecified: Secondary | ICD-10-CM | POA: Diagnosis not present

## 2024-12-07 DIAGNOSIS — I10 Essential (primary) hypertension: Secondary | ICD-10-CM | POA: Insufficient documentation

## 2024-12-07 DIAGNOSIS — M797 Fibromyalgia: Secondary | ICD-10-CM | POA: Diagnosis not present

## 2024-12-07 DIAGNOSIS — Z6841 Body Mass Index (BMI) 40.0 and over, adult: Secondary | ICD-10-CM | POA: Diagnosis not present

## 2024-12-07 DIAGNOSIS — E6689 Other obesity not elsewhere classified: Secondary | ICD-10-CM | POA: Insufficient documentation

## 2024-12-07 DIAGNOSIS — J45909 Unspecified asthma, uncomplicated: Secondary | ICD-10-CM | POA: Diagnosis not present

## 2024-12-07 DIAGNOSIS — G4733 Obstructive sleep apnea (adult) (pediatric): Secondary | ICD-10-CM | POA: Insufficient documentation

## 2024-12-07 DIAGNOSIS — Z86718 Personal history of other venous thrombosis and embolism: Secondary | ICD-10-CM | POA: Insufficient documentation

## 2024-12-07 DIAGNOSIS — R9389 Abnormal findings on diagnostic imaging of other specified body structures: Secondary | ICD-10-CM | POA: Insufficient documentation

## 2024-12-07 DIAGNOSIS — K219 Gastro-esophageal reflux disease without esophagitis: Secondary | ICD-10-CM | POA: Insufficient documentation

## 2024-12-07 HISTORY — DX: Prediabetes: R73.03

## 2024-12-07 HISTORY — PX: DILATION AND CURETTAGE OF UTERUS: SHX78

## 2024-12-07 LAB — CBC
HCT: 43.2 % (ref 36.0–46.0)
Hemoglobin: 14.3 g/dL (ref 12.0–15.0)
MCH: 29.6 pg (ref 26.0–34.0)
MCHC: 33.1 g/dL (ref 30.0–36.0)
MCV: 89.4 fL (ref 80.0–100.0)
Platelets: 232 K/uL (ref 150–400)
RBC: 4.83 MIL/uL (ref 3.87–5.11)
RDW: 13.3 % (ref 11.5–15.5)
WBC: 6.6 K/uL (ref 4.0–10.5)
nRBC: 0 % (ref 0.0–0.2)

## 2024-12-07 LAB — BASIC METABOLIC PANEL WITH GFR
Anion gap: 11 (ref 5–15)
BUN: 13 mg/dL (ref 6–20)
CO2: 20 mmol/L — ABNORMAL LOW (ref 22–32)
Calcium: 9.1 mg/dL (ref 8.9–10.3)
Chloride: 104 mmol/L (ref 98–111)
Creatinine, Ser: 0.88 mg/dL (ref 0.44–1.00)
GFR, Estimated: >60 mL/min
Glucose, Bld: 98 mg/dL (ref 70–99)
Potassium: 4.1 mmol/L (ref 3.5–5.1)
Sodium: 135 mmol/L (ref 135–145)

## 2024-12-07 LAB — TYPE AND SCREEN
ABO/RH(D): A NEG
Antibody Screen: NEGATIVE

## 2024-12-07 LAB — POCT PREGNANCY, URINE: Preg Test, Ur: NEGATIVE

## 2024-12-07 SURGERY — HYSTEROSCOPY, DIAGNOSTIC
Anesthesia: General | Site: Uterus

## 2024-12-07 MED ORDER — PROPOFOL 10 MG/ML IV BOLUS
INTRAVENOUS | Status: DC | PRN
  Administered 2024-12-07: 200 mg via INTRAVENOUS

## 2024-12-07 MED ORDER — SODIUM CHLORIDE (PF) 0.9 % IJ SOLN
INTRAMUSCULAR | Status: AC
  Filled 2024-12-07: qty 50

## 2024-12-07 MED ORDER — SCOPOLAMINE 1 MG/3DAYS TD PT72
MEDICATED_PATCH | TRANSDERMAL | Status: AC
  Administered 2024-12-07: 1 mg via TRANSDERMAL
  Filled 2024-12-07: qty 1

## 2024-12-07 MED ORDER — LIDOCAINE 2% (20 MG/ML) 5 ML SYRINGE
INTRAMUSCULAR | Status: DC | PRN
  Administered 2024-12-07: 60 mg via INTRAVENOUS

## 2024-12-07 MED ORDER — ONDANSETRON HCL 4 MG/2ML IJ SOLN
INTRAMUSCULAR | Status: DC | PRN
  Administered 2024-12-07: 4 mg via INTRAVENOUS

## 2024-12-07 MED ORDER — ACETAMINOPHEN 500 MG PO TABS
ORAL_TABLET | ORAL | Status: AC
  Administered 2024-12-07: 1000 mg via ORAL
  Filled 2024-12-07: qty 2

## 2024-12-07 MED ORDER — PHENYLEPHRINE 80 MCG/ML (10ML) SYRINGE FOR IV PUSH (FOR BLOOD PRESSURE SUPPORT)
PREFILLED_SYRINGE | INTRAVENOUS | Status: DC | PRN
  Administered 2024-12-07: 80 ug via INTRAVENOUS

## 2024-12-07 MED ORDER — LIDOCAINE HCL (PF) 1 % IJ SOLN
INTRAMUSCULAR | Status: DC | PRN
  Administered 2024-12-07: 10 mL

## 2024-12-07 MED ORDER — LEVONORGESTREL 20 MCG/DAY IU IUD
1.0000 | INTRAUTERINE_SYSTEM | INTRAUTERINE | Status: DC
  Filled 2024-12-07: qty 1

## 2024-12-07 MED ORDER — GABAPENTIN 300 MG PO CAPS
ORAL_CAPSULE | ORAL | Status: AC
  Administered 2024-12-07: 300 mg via ORAL
  Filled 2024-12-07: qty 1

## 2024-12-07 MED ORDER — MIDAZOLAM HCL 2 MG/2ML IJ SOLN
INTRAMUSCULAR | Status: AC
  Filled 2024-12-07: qty 2

## 2024-12-07 MED ORDER — ORAL CARE MOUTH RINSE: 15.0000 mL | Freq: Once | OROMUCOSAL | Status: DC

## 2024-12-07 MED ORDER — ACETAMINOPHEN 500 MG PO TABS: 1000.0000 mg | ORAL_TABLET | Freq: Three times a day (TID) | ORAL | 0 refills | Status: AC | PRN

## 2024-12-07 MED ORDER — DEXAMETHASONE SOD PHOSPHATE PF 10 MG/ML IJ SOLN
INTRAMUSCULAR | Status: DC | PRN
  Administered 2024-12-07: 10 mg via INTRAVENOUS

## 2024-12-07 MED ORDER — CEFAZOLIN SODIUM-DEXTROSE 2-3 GM-%(50ML) IV SOLR
INTRAVENOUS | Status: DC | PRN
  Administered 2024-12-07: 3 g via INTRAVENOUS

## 2024-12-07 MED ORDER — KETAMINE HCL 50 MG/5ML IJ SOSY
PREFILLED_SYRINGE | INTRAMUSCULAR | Status: AC
  Filled 2024-12-07: qty 10

## 2024-12-07 MED ORDER — AMISULPRIDE (ANTIEMETIC) 5 MG/2ML IV SOLN: 10.0000 mg | Freq: Once | INTRAVENOUS | Status: DC | PRN

## 2024-12-07 MED ORDER — ROCURONIUM BROMIDE 10 MG/ML (PF) SYRINGE
PREFILLED_SYRINGE | INTRAVENOUS | Status: DC | PRN
  Administered 2024-12-07: 100 mg via INTRAVENOUS

## 2024-12-07 MED ORDER — ACETAMINOPHEN 500 MG PO TABS: 1000.0000 mg | ORAL_TABLET | ORAL | Status: AC

## 2024-12-07 MED ORDER — FENTANYL CITRATE (PF) 250 MCG/5ML IJ SOLN
INTRAMUSCULAR | Status: DC | PRN
  Administered 2024-12-07: 100 ug via INTRAVENOUS

## 2024-12-07 MED ORDER — LIDOCAINE HCL 1 % IJ SOLN
INTRAMUSCULAR | Status: AC
  Filled 2024-12-07: qty 20

## 2024-12-07 MED ORDER — CELECOXIB 200 MG PO CAPS
ORAL_CAPSULE | ORAL | Status: AC
  Filled 2024-12-07: qty 2

## 2024-12-07 MED ORDER — SUGAMMADEX SODIUM 200 MG/2ML IV SOLN
INTRAVENOUS | Status: DC | PRN
  Administered 2024-12-07 (×2): 200 mg via INTRAVENOUS

## 2024-12-07 MED ORDER — ENSURE PRE-SURGERY PO LIQD: 296.0000 mL | Freq: Once | ORAL | Status: DC

## 2024-12-07 MED ORDER — FENTANYL CITRATE (PF) 100 MCG/2ML IJ SOLN: 25.0000 ug | INTRAMUSCULAR | Status: DC | PRN

## 2024-12-07 MED ORDER — CELECOXIB 200 MG PO CAPS
400.0000 mg | ORAL_CAPSULE | ORAL | Status: AC
  Administered 2024-12-07: 400 mg via ORAL

## 2024-12-07 MED ORDER — FENTANYL CITRATE (PF) 100 MCG/2ML IJ SOLN
INTRAMUSCULAR | Status: AC
  Filled 2024-12-07: qty 2

## 2024-12-07 MED ORDER — GABAPENTIN 300 MG PO CAPS: 300.0000 mg | ORAL_CAPSULE | ORAL | Status: AC

## 2024-12-07 MED ORDER — SODIUM CHLORIDE 0.9 % IR SOLN
Status: DC | PRN
  Administered 2024-12-07: 3000 mL

## 2024-12-07 MED ORDER — KETAMINE HCL 50 MG/5ML IJ SOSY
PREFILLED_SYRINGE | INTRAMUSCULAR | Status: DC | PRN
  Administered 2024-12-07: 50 mg via INTRAVENOUS

## 2024-12-07 MED ORDER — MIDAZOLAM HCL (PF) 2 MG/2ML IJ SOLN
INTRAMUSCULAR | Status: DC | PRN
  Administered 2024-12-07: 2 mg via INTRAVENOUS

## 2024-12-07 MED ORDER — LACTATED RINGERS IV SOLN: INTRAVENOUS | Status: DC

## 2024-12-07 MED ORDER — OXYCODONE HCL 5 MG PO TABS: 5.0000 mg | ORAL_TABLET | Freq: Once | ORAL | Status: DC | PRN

## 2024-12-07 MED ORDER — CHLORHEXIDINE GLUCONATE 0.12 % MT SOLN: 15.0000 mL | Freq: Once | OROMUCOSAL | Status: DC

## 2024-12-07 MED ORDER — OXYCODONE HCL 5 MG/5ML PO SOLN: 5.0000 mg | Freq: Once | ORAL | Status: DC | PRN

## 2024-12-07 MED ORDER — SCOPOLAMINE 1 MG/3DAYS TD PT72: 1.0000 | MEDICATED_PATCH | TRANSDERMAL | Status: DC

## 2024-12-07 SURGICAL SUPPLY — 18 items
CATH FOLEY LF 3WAY 5CC16FR (CATHETERS) IMPLANT
COVER MAYO STAND STRL (DRAPES) ×3 IMPLANT
CURETTE PIPELLE ENDOMTRL SUCTN (MISCELLANEOUS) IMPLANT
DEVICE MYOSURE LITE (MISCELLANEOUS) IMPLANT
DEVICE MYOSURE REACH (MISCELLANEOUS) IMPLANT
DILATOR CANAL MILEX (MISCELLANEOUS) ×3 IMPLANT
GLOVE BIOGEL PI MICRO STRL 6.5 (GLOVE) ×3 IMPLANT
GLOVE SURG UNDER POLY LF SZ7 (GLOVE) ×6 IMPLANT
GOWN STRL REUS W/ TWL LRG LVL3 (GOWN DISPOSABLE) ×3 IMPLANT
KIT PROCED FLUENT PRO FLT212S (KITS) ×3 IMPLANT
KIT TURNOVER KIT B (KITS) ×3 IMPLANT
NEEDLE ASPIRATION 22 (NEEDLE) ×3 IMPLANT
PACK VAGINAL MINOR WOMEN LF (CUSTOM PROCEDURE TRAY) ×3 IMPLANT
PAD OB MATERNITY 11 LF (PERSONAL CARE ITEMS) ×3 IMPLANT
SEAL ROD LENS SCOPE MYOSURE (ABLATOR) ×3 IMPLANT
SUT VIC AB 3-0 CT1 27XBRD (SUTURE) IMPLANT
TOWEL GREEN STERILE FF (TOWEL DISPOSABLE) ×3 IMPLANT
UNDERPAD 30X36 HEAVY ABSORB (UNDERPADS AND DIAPERS) ×3 IMPLANT

## 2024-12-07 NOTE — Transfer of Care (Signed)
 Immediate Anesthesia Transfer of Care Note  Patient: Maria Bright  Procedure(s) Performed: HYSTEROSCOPY, DIAGNOSTIC (Uterus)  Patient Location: PACU  Anesthesia Type:General  Level of Consciousness: awake, alert , oriented, and patient cooperative  Airway & Oxygen Therapy: Patient Spontanous Breathing and Patient connected to nasal cannula oxygen  Post-op Assessment: Report given to RN and Post -op Vital signs reviewed and stable  Post vital signs: Reviewed and stable  Last Vitals:  Vitals Value Taken Time  BP 151/92 12/07/24 15:07  Temp    Pulse 79 12/07/24 15:09  Resp 18 12/07/24 15:09  SpO2 99 % 12/07/24 15:09  Vitals shown include unfiled device data.  Last Pain:  Vitals:   12/07/24 1320  TempSrc: Oral  PainSc: 3       Patients Stated Pain Goal: 5 (12/07/24 1320)  Complications: There were no known notable events for this encounter.

## 2024-12-07 NOTE — Op Note (Signed)
 Preop diagnosis: Abnormal uterine bleeding  Postop diagnosis: same  Anesthesia: general anesthesia   Anesthesiologist: Dr. Niels  Procedure: Hysteroscopy with D&C  Surgeon: Dr. Nena Crystale Giannattasio   After being informed of the planned procedure with possible complications including bleeding, infection and uterine perforation, informed consent was obtained and patient was taken to or #6.  She was given general anesthesia  with endotracheal intubation without complication. She was placed in a dorsal decubitus position, prepped and draped in the sterile fashion and her bladder was emptied with an in-and-out catheter   Pelvic exam reveals:anteverted uterus, normal volume, no pelvic mass  A weighted speculum is inserted in the vagina. The cervix was grasped with a tenaculum forcep placed on the anterior lip.We proceed with a paracervical block using 1% Lidocaine , 10 cc. Uterus is sounded at 8 cm. The cervix is then easily dilated using Hegar dilator until # 29. This allows for easy placement of a diagnostic hysteroscope. With perfusion of Normal saline at a maximum pressure of 90 mmHg, we evaluate uterine cavity.  Observation:The uterine cavity is collapsed on the right side, allowing to partially see the left hemi-cavity. Tubal ostium is not visualized. Endometrium appears thin and normal.  We then removed our instrumentation. Using a sharp curette, we proceed with curettage of the endometrial cavity which returns a minute amount of normal-appearing endometrium.  Instruments are then removed. A figure-of-eight suture of 0 Vicryl is used for hemostasis on the anterior lip tenaculum site.Instrument and sponge count is complete x2. Estimated blood loss is minimal. Water deficit is 10 cc of NS.  The procedure is very well tolerated by the patient who is taken to recovery room in a well and stable condition.  Specimen:  endometrial curettings sent to pathology.

## 2024-12-07 NOTE — Discharge Instructions (Signed)

## 2024-12-07 NOTE — Interval H&P Note (Signed)
 History and Physical Interval Note:  12/07/2024 1:27 PM  Maria Bright  has presented today for surgery, with the diagnosis of ABNORNAL UTERINE BLEEDING,ABNORMAL PELVIC ULTRASOUND, PELVIC PAIN,MOBID OBESITY.  The various methods of treatment have been discussed with the patient and family. After consideration of risks, benefits and other options for treatment, the patient has consented to  Procedures with comments: HYSTEROSCOPY WITH MYOMECTOMY (N/A) - MYOSURE INSERTION, INTRAUTERINE DEVICE (N/A) - MIRENA  DILATATION & CURETTAGE/HYSTEROSCOPY WITH MYOSURE (N/A) as a surgical intervention.  The patient's history has been reviewed, patient examined, no change in status, stable for surgery.  I have reviewed the patient's chart and labs.  Questions were answered to the patient's satisfaction.     Nena A Denson Niccoli

## 2024-12-07 NOTE — Anesthesia Preprocedure Evaluation (Addendum)
"                                    Anesthesia Evaluation  Patient identified by MRN, date of birth, ID band Patient awake    Reviewed: Allergy & Precautions, NPO status , Patient's Chart, lab work & pertinent test results  Airway Mallampati: I  TM Distance: >3 FB Neck ROM: Full    Dental no notable dental hx. (+) Teeth Intact, Dental Advisory Given   Pulmonary asthma , sleep apnea and Continuous Positive Airway Pressure Ventilation    Pulmonary exam normal breath sounds clear to auscultation       Cardiovascular hypertension, + DVT  Normal cardiovascular exam Rhythm:Regular Rate:Normal     Neuro/Psych  Headaches PSYCHIATRIC DISORDERS Anxiety Depression       GI/Hepatic Neg liver ROS,GERD  ,,  Endo/Other    Class 4 obesity (BMI 52)  Renal/GU negative Renal ROS  negative genitourinary   Musculoskeletal  (+)  Fibromyalgia -  Abdominal   Peds  Hematology negative hematology ROS (+)   Anesthesia Other Findings   Reproductive/Obstetrics                              Anesthesia Physical Anesthesia Plan  ASA: 3  Anesthesia Plan: General   Post-op Pain Management: Toradol  IV (intra-op)* and Tylenol  PO (pre-op)*   Induction: Intravenous  PONV Risk Score and Plan: 3 and Ondansetron , Dexamethasone  and Midazolam   Airway Management Planned: LMA  Additional Equipment:   Intra-op Plan:   Post-operative Plan: Extubation in OR  Informed Consent: I have reviewed the patients History and Physical, chart, labs and discussed the procedure including the risks, benefits and alternatives for the proposed anesthesia with the patient or authorized representative who has indicated his/her understanding and acceptance.     Dental advisory given  Plan Discussed with: CRNA  Anesthesia Plan Comments:          Anesthesia Quick Evaluation  "

## 2024-12-07 NOTE — Anesthesia Procedure Notes (Signed)
 Procedure Name: Intubation Date/Time: 12/07/2024 2:23 PM  Performed by: Payslee Bateson L, CRNAPre-anesthesia Checklist: Patient identified, Emergency Drugs available, Suction available and Patient being monitored Patient Re-evaluated:Patient Re-evaluated prior to induction Oxygen Delivery Method: Circle System Utilized Preoxygenation: Pre-oxygenation with 100% oxygen Induction Type: IV induction Ventilation: Two handed mask ventilation required and Oral airway inserted - appropriate to patient size Laryngoscope Size: Mac and 3 Grade View: Grade II Tube type: Oral Tube size: 7.0 mm Number of attempts: 1 Airway Equipment and Method: Stylet and Oral airway Placement Confirmation: ETT inserted through vocal cords under direct vision, positive ETCO2 and breath sounds checked- equal and bilateral Secured at: 23 cm Tube secured with: Tape Dental Injury: Teeth and Oropharynx as per pre-operative assessment

## 2024-12-08 ENCOUNTER — Encounter (HOSPITAL_COMMUNITY): Payer: Self-pay | Admitting: Obstetrics and Gynecology

## 2024-12-08 ENCOUNTER — Other Ambulatory Visit (HOSPITAL_COMMUNITY): Payer: Self-pay

## 2024-12-08 NOTE — Anesthesia Postprocedure Evaluation (Signed)
"   Anesthesia Post Note  Patient: Maria Bright  Procedure(s) Performed: HYSTEROSCOPY, DIAGNOSTIC (Uterus) DILATION AND CURETTAGE (Uterus)     Patient location during evaluation: PACU Anesthesia Type: General Level of consciousness: awake and alert Pain management: pain level controlled Vital Signs Assessment: post-procedure vital signs reviewed and stable Respiratory status: spontaneous breathing, nonlabored ventilation, respiratory function stable and patient connected to nasal cannula oxygen Cardiovascular status: blood pressure returned to baseline and stable Postop Assessment: no apparent nausea or vomiting Anesthetic complications: no   There were no known notable events for this encounter.  Last Vitals:  Vitals:   12/07/24 1530 12/07/24 1540  BP: 136/84   Pulse: 74 73  Resp: 17 13  Temp:    SpO2: 100% 99%    Last Pain:  Vitals:   12/07/24 1515  TempSrc:   PainSc: 0-No pain                 Dillan Candela L Lavonna Lampron      "

## 2024-12-11 LAB — SURGICAL PATHOLOGY

## 2025-01-02 ENCOUNTER — Other Ambulatory Visit (HOSPITAL_COMMUNITY): Payer: Self-pay

## 2025-01-02 ENCOUNTER — Other Ambulatory Visit: Payer: Self-pay | Admitting: Internal Medicine

## 2025-01-03 ENCOUNTER — Other Ambulatory Visit: Payer: Self-pay | Admitting: Obstetrics and Gynecology

## 2025-01-03 NOTE — Telephone Encounter (Signed)
 Requested medication (s) are due for refill today: yes  Requested medication (s) are on the active medication list: yes  Last refill:  11/28/24  Future visit scheduled: yes  Notes to clinic:  Unable to refill per protocol, cannot delegate.      Requested Prescriptions  Pending Prescriptions Disp Refills   pregabalin  (LYRICA ) 150 MG capsule 60 capsule 0    Sig: Take 1 capsule (150 mg total) by mouth 2 (two) times daily.     Not Delegated - Neurology:  Anticonvulsants - Controlled - pregabalin  Failed - 01/03/2025  3:17 PM      Failed - This refill cannot be delegated      Passed - Cr in normal range and within 360 days    Creat  Date Value Ref Range Status  07/07/2024 1.01 (H) 0.50 - 0.99 mg/dL Final   Creatinine, Ser  Date Value Ref Range Status  12/07/2024 0.88 0.44 - 1.00 mg/dL Final         Passed - Completed PHQ-2 or PHQ-9 in the last 360 days      Passed - Valid encounter within last 12 months    Recent Outpatient Visits           2 months ago Intractable migraine with aura without status migrainosus   [place] [facility] [place], Angeline ORN, NP   6 months ago Encounter for general adult medical examination with abnormal findings   [place] [facility] [place], Angeline ORN, NP

## 2025-01-04 NOTE — Progress Notes (Unsigned)
 "  Subjective:    Patient ID: Maria Bright, female    DOB: [birth year], 49 y.o.   MRN: [id number]  HPI  Patient presents to clinic today for follow-up of chronic conditions.  ADHD: She reports mainly inattention.  Managed on lisdexamfetamine .  She follows with psychiatry.  Anxiety and depression (moderate, recurrent): Chronic, managed on sertraline  and bupropion .  She has been on desvenlafaxine  in the past.  She is not currently seeing a therapist.  She denies SI/HI.  Chronic low back pain: Persistent.  Managed with pregabalin .  MRI lumbar spine from 01/2023 reviewed.  She is following with orthopedics.  GERD: Triggered by spicy foods.  Denies breakthrough on pantoprazole  and famotidine.  Upper GI from 10/2024 reviewed.  HLD: Her last LDL was 118, triglycerides 126, 06/2024.  She denies myalgias on pravastatin .  She tries to consume a low fat diet.  HTN: Her BP today is 138/88.  She is taking propranolol  as prescribed.  ECG from 03/2023 reviewed reviewed.  PCOS: She is not currently taking any medications for this.  She follows with GYN.  Asthma: Chronic, manged on budesonide  and albuterol . There are no PFTs on file.  She does not follow with pulmonology.  OSA: She averages hours of sleep per night with the use of her CPAP.  Sleep study from 08/2024 reviewed.  There is no sleep study on file.   History of DVT: Not currently on anticoagulation. She plans to start daily ASA.  Migraines: These occur.  Triggered by.  She is taking sumatriptan  as needed with good relief of symptoms.  She does not follow with neurology.  Prediabetes: Her last A1c was 5.9%, 06/2024.  She is not taking any oral diabetic medication at this time.  She does not check her sugars.  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   acute deep vein thrombosis involving the right posterior tibial veins, and right peroneal veins   Dyspnea    due to allergies, asthma   Fibromyalgia    GERD (gastroesophageal reflux disease)    Heart palpitations    has not been seen for it   History of peripheral neuropathy    Hypertension    never has been treated with medications   Migraine    Ovarian cyst    Polycystic disease, ovaries    POC    Pre-diabetes    Seasonal allergies    Sleep apnea    CPAP    Uterine fibroid     Current Outpatient Medications  Medication Sig Dispense Refill   acetaminophen  (TYLENOL ) 500 MG tablet Take 2 tablets (1,000 mg total) by mouth every 8 (eight) hours as needed for mild pain (pain score 1-3). 30 tablet 0   albuterol  (VENTOLIN  HFA) 108 (90 Base) MCG/ACT inhaler Inhale 2 puffs into the lungs every 6 (six) hours as needed for wheezing or shortness of breath. 8 g 5   aspirin 81 MG chewable tablet Chew 81 mg by mouth.     budesonide  (PULMICORT  FLEXHALER) 180 MCG/ACT inhaler 1 puff.     buPROPion  (WELLBUTRIN  XL)  300 MG 24 hr tablet TAKE 1 TABLET BY MOUTH EVERY DAY 90 tablet 0   cetirizine (ZYRTEC) 10 MG tablet Take 10 mg by mouth at bedtime.     diphenhydrAMINE (BENADRYL) 25 MG tablet Take 50 mg by mouth every 6 (six) hours as needed for itching or allergies.     docusate sodium  (COLACE) 100 MG capsule Take 100 mg by mouth 2 (two) times daily.     EPINEPHrine  0.3 mg/0.3 mL IJ SOAJ injection INJECT INTO OUTER THIGH AS NEEDED FOR ANAPHYLAXIS     famotidine (PEPCID) 40 MG tablet Take 40 mg by mouth. (Patient taking differently: Take 40 mg by mouth as needed (For hives per patient).)     fluticasone  (FLONASE ) 50 MCG/ACT nasal spray Place 2 sprays into both nostrils daily. 16 g 6   hydrocortisone cream 1 % Apply 1 application topically 2 (two)  times daily as needed for itching.     ibuprofen  (ADVIL ) 800 MG tablet Take 1 tablet (800 mg total) by mouth every 8 (eight) hours as needed for moderate pain (pain). For AFTER surgery only 30 tablet 1   lisdexamfetamine  (VYVANSE ) 40 MG capsule Take 40 mg by mouth every morning.     lisdexamfetamine  (VYVANSE ) 40 MG capsule Take 1 capsule (40 mg total) by mouth every morning. 30 capsule 0   lisdexamfetamine  (VYVANSE ) 40 MG capsule Take 1 capsule (40 mg total) by mouth daily. 30 capsule 0   lisdexamfetamine  (VYVANSE ) 40 MG capsule Take 1 capsule (40 mg total) by mouth every morning. 30 capsule 0   lisdexamfetamine  (VYVANSE ) 40 MG capsule Take 1 capsule (40 mg total) by mouth every morning. 12/01/24 30 capsule 0   lisdexamfetamine  (VYVANSE ) 40 MG capsule Take 1 capsule (40 mg total) by mouth daily. 01/01/25 30 capsule 0   metaxalone  (SKELAXIN ) 800 MG tablet Take 800 mg by mouth. (Patient taking differently: Take 800 mg by mouth as needed for muscle spasms.)     nystatin  (MYCOSTATIN /NYSTOP ) powder Apply topically 3 (three) times daily. 60 g 5   pantoprazole  (PROTONIX ) 40 MG tablet Take 1 tablet (40 mg total) by mouth 2 (two) times daily. Take 1 tab p.o. twice daily x 4 weeks, then reduce to 1 tab daily.  Take 30-60 minutes before meal. 180 tablet 1   pravastatin  (PRAVACHOL ) 10 MG tablet Take 1 tablet (10 mg total) by mouth daily. 90 tablet 1   pregabalin  (LYRICA ) 150 MG capsule Take 1 capsule (150 mg total) by mouth 2 (two) times daily. 60 capsule 0   propranolol  (INDERAL ) 10 MG tablet Take 1 tablet (10 mg total) by mouth daily as needed (anxiety). Anxiety 90 tablet 1   sertraline  (ZOLOFT ) 100 MG tablet Take 1.5 tablets (150 mg total) by mouth daily. 135 tablet 0   SUMAtriptan  (IMITREX ) 50 MG tablet Take 1 tablet (50 mg total) by mouth every 2 (two) hours as needed for migraine. May repeat in 2 hours if headache persists or recurs. 10 tablet 2   tirzepatide  (ZEPBOUND ) 2.5 MG/0.5ML Pen Inject 2.5 mg into  the skin once a week. 2 mL 0   No current facility-administered medications for this visit.    Allergies  Allergen Reactions   Bioflavonoids Dermatitis    citrus bioflavonoids   Cayenne Other (See Comments)    Blisters in Mouth   Flavoring Agent Other (See Comments)    Blisters in Mouth   Wound Dressing Adhesive Dermatitis    blisters   Lactose Intolerance (Gi)    Latex  Hives   Morphine And Codeine Nausea And Vomiting        Other     Tomatoes, pineapple, oranges, grapefruit, lemons, limes, citrus fruits, and all nightshade vegetables    Adhesive [Tape] Rash    blisters   Nickel Rash    Family History  Problem Relation Age of Onset   Arthritis Mother    Hyperlipidemia Mother    Heart disease Mother    Hypertension Mother    Rheum arthritis Maternal Aunt    Hyperlipidemia Paternal Aunt    Diabetes Paternal Aunt    Rheum arthritis Maternal Grandmother    Heart disease Maternal Grandmother    Arthritis Maternal Grandfather    Hyperlipidemia Maternal Grandfather    Heart disease Maternal Grandfather    Hypertension Maternal Grandfather    Prostate cancer Paternal Uncle    Colon cancer Neg Hx    Breast cancer Neg Hx    Pancreatic cancer Neg Hx    Endometrial cancer Neg Hx    Ovarian cancer Neg Hx     Social History   Socioeconomic History   Marital status: Married    Spouse name: Not on file   Number of children: Not on file   Years of education: Not on file   Highest education level: Not on file  Occupational History   Not on file  Tobacco Use   Smoking status: Never   Smokeless tobacco: Never  Vaping Use   Vaping status: Never Used  Substance and Sexual Activity   Alcohol use: No    Comment: seldom    Drug use: No   Sexual activity: Yes    Birth control/protection: None, Surgical    Comment: hUSBAND VASECTOMY  Other Topics Concern   Not on file  Social History Narrative   Not on file   Social Drivers of Health   Tobacco Use: Low Risk  (12/07/2024)   Patient History    Smoking Tobacco Use: Never    Smokeless Tobacco Use: Never    Passive Exposure: Not on file  Financial Resource Strain: Low Risk (05/11/2024)   Received from [facility]   Overall Financial Resource Strain (CARDIA)    Difficulty of Paying Living Expenses: Not hard at all  Food Insecurity: No Food Insecurity (05/11/2024)   Received from [facility]   Epic    Within the past 12 months, you worried that your food would run out before you got the money to buy more.: Never true    Within the past 12 months, the food you bought just didn't last and you didn't have money to get more.: Never true  Transportation Needs: No Transportation Needs (05/11/2024)   Received from [facility] - Transportation    Lack of Transportation (Medical): No    Lack of Transportation (Non-Medical): No  Physical Activity: Not on file  Stress: Not on file  Social Connections: Not on file  Intimate Partner Violence: Not on file  Depression [phone]): Low Risk (07/07/2024)   Depression (PHQ2-9)    PHQ-2 Score: 3  Alcohol Screen: Low Risk (04/07/2022)   Alcohol Screen    Last Alcohol Screening Score (AUDIT): 1  Housing: Low Risk (05/11/2024)   Received [facility]    In the last 12 months, was there a time when you were not able to pay the mortgage or rent on time?: No    In the past 12 months, how many times have you moved where you  were living?: 0    At any time in the past 12 months, were you homeless or living in a shelter (including now)?: No  Utilities: Not At Risk (05/11/2024)   Received from [facility] Utilities    Threatened with loss of utilities: No  Health Literacy: Not on file     Constitutional: Pt reports intermittent headaches. Denies fever, malaise, or abrupt weight changes.  HEENT: Denies eye pain, eye redness, ear pain, ringing in the ears, wax buildup, runny nose, nasal congestion, bloody nose, or sore throat. Respiratory:  Denies difficulty breathing, shortness of breath, cough or sputum production.   Cardiovascular: Denies chest pain, chest tightness, palpitations or swelling in the hands or feet.  Gastrointestinal: Pt reports intermittent reflux. Denies abdominal pain, bloating, constipation, diarrhea or blood in the stool.  GU: Denies urgency, frequency, pain with urination, burning sensation, blood in urine, odor or discharge. Musculoskeletal: Pt reports chronic low back pain. Denies decrease in range of motion, difficulty with gait, or joint swelling.  Skin: Denies redness, rashes, lesions or ulcercations.  Neurological: Patient reports inattention.  Denies dizziness, difficulty with memory, difficulty with speech or problems with balance and coordination.  Psych: Pt has a history of anxiety and depression. Denies SI/HI.  No other specific complaints in a complete review of systems (except as listed in HPI above).     Objective:   Physical Exam There were no vitals taken for this visit.  Wt Readings from Last 3 Encounters:  12/07/24 (!) 320 lb (145.2 kg)  10/31/24 (!) 320 lb (145.2 kg)  09/12/24 (!) 321 lb 3.2 oz (145.7 kg)    General: Appears her stated age, obese, in NAD. Skin: Warm, dry and intact.  HEENT: Head: normal shape and size; Eyes: sclera white and EOMs intact;  Cardiovascular: Normal rate and rhythm. S1,S2 noted.  No murmur, rubs or gallops noted. No JVD or BLE edema. Pulmonary/Chest: Normal effort and positive vesicular breath sounds. No respiratory distress. No wheezes, rales or ronchi noted.  Abdomen: Normal bowel sounds.  Musculoskeletal: No difficulty with gait.  Neurological: Alert and oriented.  Psychiatric: Mood and affect normal. Behavior is normal. Judgment and thought content normal.   BMET    Component Value Date/Time   NA 135 12/07/2024 1337   K 4.1 12/07/2024 1337   CL 104 12/07/2024 1337   CO2 20 (L) 12/07/2024 1337   GLUCOSE 98 12/07/2024 1337   BUN 13  12/07/2024 1337   CREATININE 0.88 12/07/2024 1337   CREATININE 1.01 (H) 07/07/2024 1113   CALCIUM 9.1 12/07/2024 1337   GFRNONAA >60 12/07/2024 1337   GFRAA >60 06/26/2020 1701    Lipid Panel     Component Value Date/Time   CHOL 202 (H) 07/07/2024 1113   TRIG 126 07/07/2024 1113   HDL 60 07/07/2024 1113   CHOLHDL 3.4 07/07/2024 1113   VLDL 19.2 02/15/2020 1514   LDLCALC 118 (H) 07/07/2024 1113    CBC    Component Value Date/Time   WBC 6.6 12/07/2024 1337   RBC 4.83 12/07/2024 1337   HGB 14.3 12/07/2024 1337   HCT 43.2 12/07/2024 1337   PLT 232 12/07/2024 1337   MCV 89.4 12/07/2024 1337   MCH 29.6 12/07/2024 1337   MCHC 33.1 12/07/2024 1337   RDW 13.3 12/07/2024 1337   LYMPHSABS 2.2 06/26/2020 1701   MONOABS 1.1 (H) 06/26/2020 1701   EOSABS 0.3 06/26/2020 1701   BASOSABS 0.1 06/26/2020 1701    Hgb A1C Lab Results  Component Value Date   HGBA1C 5.9 (H) 07/07/2024            Assessment & Plan:       RTC in 6 months for your annual exam Angeline Laura, NP   "

## 2025-01-05 ENCOUNTER — Encounter: Payer: Self-pay | Admitting: Internal Medicine

## 2025-01-05 ENCOUNTER — Ambulatory Visit: Admitting: Internal Medicine

## 2025-01-05 VITALS — BP 128/84 | Ht 66.0 in | Wt 323.6 lb

## 2025-01-05 DIAGNOSIS — E7800 Pure hypercholesterolemia, unspecified: Secondary | ICD-10-CM

## 2025-01-05 DIAGNOSIS — R7303 Prediabetes: Secondary | ICD-10-CM

## 2025-01-05 LAB — COMPREHENSIVE METABOLIC PANEL WITH GFR
AG Ratio: 1.6 (calc) (ref 1.0–2.5)
ALT: 18 U/L (ref 6–29)
AST: 15 U/L (ref 10–35)
Albumin: 4.5 g/dL (ref 3.6–5.1)
Alkaline phosphatase (APISO): 102 U/L (ref 31–125)
BUN: 17 mg/dL (ref 7–25)
CO2: 29 mmol/L (ref 20–32)
Calcium: 9.3 mg/dL (ref 8.6–10.2)
Chloride: 103 mmol/L (ref 98–110)
Creat: 0.95 mg/dL (ref 0.50–0.99)
Globulin: 2.8 g/dL (ref 1.9–3.7)
Glucose, Bld: 79 mg/dL (ref 65–139)
Potassium: 4.7 mmol/L (ref 3.5–5.3)
Sodium: 138 mmol/L (ref 135–146)
Total Bilirubin: 0.3 mg/dL (ref 0.2–1.2)
Total Protein: 7.3 g/dL (ref 6.1–8.1)
eGFR: 75 mL/min/{1.73_m2}

## 2025-01-05 LAB — LIPID PANEL
Cholesterol: 180 mg/dL
HDL: 51 mg/dL
LDL Cholesterol (Calc): 98 mg/dL
Non-HDL Cholesterol (Calc): 129 mg/dL
Total CHOL/HDL Ratio: 3.5 (calc)
Triglycerides: 212 mg/dL — ABNORMAL HIGH

## 2025-01-05 LAB — CBC
HCT: 42.4 % (ref 35.9–46.0)
Hemoglobin: 14.2 g/dL (ref 11.7–15.5)
MCH: 28.9 pg (ref 27.0–33.0)
MCHC: 33.5 g/dL (ref 31.6–35.4)
MCV: 86.2 fL (ref 81.4–101.7)
MPV: 11 fL (ref 7.5–12.5)
Platelets: 259 10*3/uL (ref 140–400)
RBC: 4.92 Million/uL (ref 3.80–5.10)
RDW: 13 % (ref 11.0–15.0)
WBC: 8.6 10*3/uL (ref 3.8–10.8)

## 2025-01-05 MED ORDER — TRIAMCINOLONE ACETONIDE 0.1 % EX OINT: 1.0000 | TOPICAL_OINTMENT | Freq: Two times a day (BID) | CUTANEOUS | 0 refills | Status: AC

## 2025-01-05 MED ORDER — PULMICORT FLEXHALER 180 MCG/ACT IN AEPB: 1.0000 | INHALATION_SPRAY | Freq: Two times a day (BID) | RESPIRATORY_TRACT | 1 refills | Status: AC

## 2025-01-05 MED ORDER — ZEPBOUND 2.5 MG/0.5ML ~~LOC~~ SOAJ: 2.5000 mg | SUBCUTANEOUS | 0 refills | Status: AC

## 2025-01-05 MED ORDER — PREGABALIN 150 MG PO CAPS: 150.0000 mg | ORAL_CAPSULE | Freq: Two times a day (BID) | ORAL | 0 refills | Status: AC

## 2025-03-01 ENCOUNTER — Ambulatory Visit (HOSPITAL_COMMUNITY): Admit: 2025-03-01 | Admitting: Obstetrics and Gynecology

## 2025-07-18 ENCOUNTER — Encounter: Admitting: Internal Medicine
